# Patient Record
Sex: Male | Born: 1960
Health system: Southern US, Community
[De-identification: ages and names within clinical notes are randomized; demographics above are authoritative.]

## PROBLEM LIST (undated history)

## (undated) ENCOUNTER — Ambulatory Visit (HOSPITAL_COMMUNITY): Admission: EM | Discharge status: Absent without leave | Payer: Self-pay

## (undated) DIAGNOSIS — K219 Gastro-esophageal reflux disease without esophagitis: Secondary | ICD-10-CM

## (undated) DIAGNOSIS — E119 Type 2 diabetes mellitus without complications: Principal | ICD-10-CM

## (undated) DIAGNOSIS — J309 Allergic rhinitis, unspecified: Secondary | ICD-10-CM

## (undated) DIAGNOSIS — E785 Hyperlipidemia, unspecified: Secondary | ICD-10-CM

## (undated) DIAGNOSIS — F17200 Nicotine dependence, unspecified, uncomplicated: Secondary | ICD-10-CM

## (undated) DIAGNOSIS — G4710 Hypersomnia, unspecified: Secondary | ICD-10-CM

## (undated) DIAGNOSIS — J45909 Unspecified asthma, uncomplicated: Secondary | ICD-10-CM

## (undated) HISTORY — DX: Type 2 diabetes mellitus without complications: E11.9

## (undated) HISTORY — DX: Hyperlipidemia, unspecified: E78.5

## (undated) HISTORY — DX: Hypersomnia, unspecified: G47.10

## (undated) HISTORY — DX: Gastro-esophageal reflux disease without esophagitis: K21.9

## (undated) HISTORY — DX: Unspecified asthma, uncomplicated: J45.909

## (undated) HISTORY — DX: Allergic rhinitis, unspecified: J30.9

## (undated) HISTORY — DX: Nicotine dependence, unspecified, uncomplicated: F17.200

## (undated) HISTORY — PX: OTHER SURGICAL HISTORY: SHX169

## (undated) HISTORY — PX: TONSILLECTOMY: SUR1361

---

## 2007-05-29 ENCOUNTER — Ambulatory Visit: Payer: Self-pay | Admitting: Internal Medicine

## 2007-05-29 LAB — CONVERTED CEMR LAB
BUN: 19 mg/dL (ref 6–23)
Basophils Absolute: 0.1 10*3/uL (ref 0.0–0.1)
Bilirubin Urine: NEGATIVE
CO2: 32 meq/L (ref 19–32)
Cholesterol: 216 mg/dL (ref 0–200)
Creatinine,U: 134.3 mg/dL
Direct LDL: 137.6 mg/dL
GFR calc Af Amer: 103 mL/min
HDL: 25.3 mg/dL — ABNORMAL LOW (ref >39.0)
Hemoglobin, Urine: NEGATIVE
Hemoglobin: 15.9 g/dL (ref 13.0–17.0)
Leukocytes, UA: NEGATIVE
Lymphocytes Relative: 34 % (ref 12.0–46.0)
MCHC: 35 g/dL (ref 30.0–36.0)
Microalb, Ur: 0.4 mg/dL (ref 0.0–1.9)
Monocytes Absolute: 0.3 10*3/uL (ref 0.2–0.7)
Monocytes Relative: 4.8 % (ref 3.0–11.0)
Neutro Abs: 3.9 10*3/uL (ref 1.4–7.7)
Potassium: 4.2 meq/L (ref 3.5–5.1)
VLDL: 51 mg/dL — ABNORMAL HIGH (ref 0–40)

## 2008-04-28 ENCOUNTER — Ambulatory Visit: Payer: Self-pay | Admitting: Internal Medicine

## 2008-04-28 DIAGNOSIS — E119 Type 2 diabetes mellitus without complications: Secondary | ICD-10-CM

## 2008-04-28 HISTORY — DX: Type 2 diabetes mellitus without complications: E11.9

## 2008-04-28 LAB — CONVERTED CEMR LAB
Alkaline Phosphatase: 52 units/L (ref 39–117)
Basophils Absolute: 0 10*3/uL (ref 0.0–0.1)
Bilirubin Urine: NEGATIVE
Bilirubin, Direct: 0.1 mg/dL (ref 0.0–0.3)
Eosinophils Absolute: 0.1 10*3/uL (ref 0.0–0.7)
GFR calc Af Amer: 103 mL/min
GFR calc non Af Amer: 85 mL/min
HCT: 48.2 % (ref 39.0–52.0)
HDL: 29.1 mg/dL — ABNORMAL LOW (ref >39.0)
Hemoglobin, Urine: NEGATIVE
Hgb A1c MFr Bld: 7.4 % — ABNORMAL HIGH (ref 4.6–6.0)
MCHC: 34.8 g/dL (ref 30.0–36.0)
MCV: 90.1 fL (ref 78.0–100.0)
Microalb Creat Ratio: 2.4 mg/g (ref 0.0–30.0)
Microalb, Ur: 0.7 mg/dL (ref 0.0–1.9)
Monocytes Absolute: 0.4 10*3/uL (ref 0.1–1.0)
Monocytes Relative: 5.6 % (ref 3.0–12.0)
Nitrite: NEGATIVE
PSA: 0.35 ng/mL (ref 0.10–4.00)
Platelets: 121 10*3/uL — ABNORMAL LOW (ref 150–400)
Potassium: 4.3 meq/L (ref 3.5–5.1)
RDW: 11.9 % (ref 11.5–14.6)
Sodium: 137 meq/L (ref 135–145)
TSH: 1.83 microintl units/mL (ref 0.35–5.50)
Total Bilirubin: 0.8 mg/dL (ref 0.3–1.2)
Triglycerides: 338 mg/dL (ref 0–149)

## 2008-04-30 ENCOUNTER — Telehealth: Payer: Self-pay | Admitting: Internal Medicine

## 2008-05-05 DIAGNOSIS — K219 Gastro-esophageal reflux disease without esophagitis: Secondary | ICD-10-CM | POA: Insufficient documentation

## 2008-05-05 DIAGNOSIS — J45909 Unspecified asthma, uncomplicated: Secondary | ICD-10-CM | POA: Insufficient documentation

## 2008-05-05 DIAGNOSIS — J309 Allergic rhinitis, unspecified: Secondary | ICD-10-CM

## 2008-05-05 DIAGNOSIS — E785 Hyperlipidemia, unspecified: Secondary | ICD-10-CM | POA: Insufficient documentation

## 2008-05-05 HISTORY — DX: Gastro-esophageal reflux disease without esophagitis: K21.9

## 2008-05-05 HISTORY — DX: Unspecified asthma, uncomplicated: J45.909

## 2008-05-05 HISTORY — DX: Hyperlipidemia, unspecified: E78.5

## 2008-05-05 HISTORY — DX: Allergic rhinitis, unspecified: J30.9

## 2009-03-10 ENCOUNTER — Ambulatory Visit: Payer: Self-pay | Admitting: Internal Medicine

## 2009-03-10 DIAGNOSIS — G4710 Hypersomnia, unspecified: Secondary | ICD-10-CM

## 2009-03-10 DIAGNOSIS — F17200 Nicotine dependence, unspecified, uncomplicated: Secondary | ICD-10-CM | POA: Insufficient documentation

## 2009-03-10 HISTORY — DX: Hypersomnia, unspecified: G47.10

## 2009-03-10 HISTORY — DX: Nicotine dependence, unspecified, uncomplicated: F17.200

## 2009-03-14 LAB — CONVERTED CEMR LAB
Alkaline Phosphatase: 58 units/L (ref 39–117)
BUN: 22 mg/dL (ref 6–23)
Basophils Relative: 0.4 % (ref 0.0–3.0)
Bilirubin Urine: NEGATIVE
Bilirubin, Direct: 0.1 mg/dL (ref 0.0–0.3)
Calcium: 9.3 mg/dL (ref 8.4–10.5)
Creatinine, Ser: 1 mg/dL (ref 0.4–1.5)
Creatinine,U: 143.7 mg/dL
Direct LDL: 98.5 mg/dL
Eosinophils Absolute: 0.1 10*3/uL (ref 0.0–0.7)
HDL: 26.8 mg/dL — ABNORMAL LOW (ref >39.00)
Hemoglobin, Urine: NEGATIVE
Ketones, ur: NEGATIVE mg/dL
Leukocytes, UA: NEGATIVE
Lymphocytes Relative: 30 % (ref 12.0–46.0)
MCHC: 34.5 g/dL (ref 30.0–36.0)
Monocytes Absolute: 0.3 10*3/uL (ref 0.1–1.0)
Neutrophils Relative %: 62 % (ref 43.0–77.0)
PSA: 0.27 ng/mL (ref 0.10–4.00)
Platelets: 114 10*3/uL — ABNORMAL LOW (ref 150.0–400.0)
RBC: 5.33 M/uL (ref 4.22–5.81)
Specific Gravity, Urine: 1.025 (ref 1.000–1.030)
TSH: 1.96 microintl units/mL (ref 0.35–5.50)
Total Bilirubin: 0.7 mg/dL (ref 0.3–1.2)
Total CHOL/HDL Ratio: 7
Total Protein: 6.7 g/dL (ref 6.0–8.3)
Triglycerides: 406 mg/dL — ABNORMAL HIGH (ref 0.0–149.0)
Urine Glucose: NEGATIVE mg/dL
Urobilinogen, UA: 0.2 (ref 0.0–1.0)
VLDL: 81.2 mg/dL — ABNORMAL HIGH (ref 0.0–40.0)
WBC: 6.4 10*3/uL (ref 4.5–10.5)

## 2009-09-08 ENCOUNTER — Ambulatory Visit: Payer: Self-pay | Admitting: Internal Medicine

## 2009-09-08 DIAGNOSIS — M25569 Pain in unspecified knee: Secondary | ICD-10-CM | POA: Insufficient documentation

## 2009-09-09 LAB — CONVERTED CEMR LAB
CO2: 28 meq/L (ref 19–32)
Calcium: 9.5 mg/dL (ref 8.4–10.5)
Chloride: 103 meq/L (ref 96–112)
Cholesterol: 179 mg/dL (ref 0–200)
Direct LDL: 97.6 mg/dL
Glucose, Bld: 251 mg/dL — ABNORMAL HIGH (ref 70–99)
HDL: 34.1 mg/dL — ABNORMAL LOW (ref >39.00)
Potassium: 4.5 meq/L (ref 3.5–5.1)
Sodium: 139 meq/L (ref 135–145)
Total CHOL/HDL Ratio: 5
Triglycerides: 359 mg/dL — ABNORMAL HIGH (ref 0.0–149.0)

## 2009-09-20 ENCOUNTER — Ambulatory Visit: Payer: Self-pay | Admitting: Internal Medicine

## 2009-09-20 DIAGNOSIS — J40 Bronchitis, not specified as acute or chronic: Secondary | ICD-10-CM | POA: Insufficient documentation

## 2010-01-18 ENCOUNTER — Ambulatory Visit: Payer: Self-pay | Admitting: Internal Medicine

## 2010-01-18 DIAGNOSIS — R062 Wheezing: Secondary | ICD-10-CM | POA: Insufficient documentation

## 2010-01-18 DIAGNOSIS — J019 Acute sinusitis, unspecified: Secondary | ICD-10-CM | POA: Insufficient documentation

## 2010-01-21 ENCOUNTER — Ambulatory Visit: Payer: Self-pay | Admitting: Family Medicine

## 2010-01-25 ENCOUNTER — Ambulatory Visit: Payer: Self-pay | Admitting: Internal Medicine

## 2010-02-06 ENCOUNTER — Encounter: Payer: Self-pay | Admitting: Internal Medicine

## 2010-02-08 ENCOUNTER — Telehealth: Payer: Self-pay | Admitting: Internal Medicine

## 2010-02-28 ENCOUNTER — Telehealth: Payer: Self-pay | Admitting: Internal Medicine

## 2010-03-06 ENCOUNTER — Ambulatory Visit: Payer: Self-pay | Admitting: Internal Medicine

## 2010-03-06 ENCOUNTER — Telehealth: Payer: Self-pay | Admitting: Internal Medicine

## 2010-03-06 LAB — CONVERTED CEMR LAB
Alkaline Phosphatase: 47 units/L (ref 39–117)
Basophils Absolute: 0 10*3/uL (ref 0.0–0.1)
Bilirubin Urine: NEGATIVE
Bilirubin, Direct: 0.1 mg/dL (ref 0.0–0.3)
Calcium: 9.4 mg/dL (ref 8.4–10.5)
Cholesterol: 139 mg/dL (ref 0–200)
Creatinine, Ser: 1 mg/dL (ref 0.4–1.5)
Creatinine,U: 145.1 mg/dL
Direct LDL: 62.2 mg/dL
Eosinophils Absolute: 0.1 10*3/uL (ref 0.0–0.7)
HDL: 30.3 mg/dL — ABNORMAL LOW (ref >39.00)
Hemoglobin, Urine: NEGATIVE
Hemoglobin: 15.7 g/dL (ref 13.0–17.0)
Ketones, ur: NEGATIVE mg/dL
Lymphocytes Relative: 27.2 % (ref 12.0–46.0)
Lymphs Abs: 2 10*3/uL (ref 0.7–4.0)
MCHC: 35.5 g/dL (ref 30.0–36.0)
Neutro Abs: 5 10*3/uL (ref 1.4–7.7)
Platelets: 116 10*3/uL — ABNORMAL LOW (ref 150.0–400.0)
RDW: 12.7 % (ref 11.5–14.6)
Sodium: 140 meq/L (ref 135–145)
Total Bilirubin: 0.5 mg/dL (ref 0.3–1.2)
Total CHOL/HDL Ratio: 5
Triglycerides: 331 mg/dL — ABNORMAL HIGH (ref 0.0–149.0)
Urine Glucose: >=1000 mg/dL
Urobilinogen, UA: 0.2 (ref 0.0–1.0)
VLDL: 66.2 mg/dL — ABNORMAL HIGH (ref 0.0–40.0)

## 2010-03-10 ENCOUNTER — Ambulatory Visit: Payer: Self-pay | Admitting: Internal Medicine

## 2010-03-31 ENCOUNTER — Encounter (INDEPENDENT_AMBULATORY_CARE_PROVIDER_SITE_OTHER): Payer: Self-pay | Admitting: *Deleted

## 2010-04-26 ENCOUNTER — Telehealth: Payer: Self-pay | Admitting: Internal Medicine

## 2010-04-28 ENCOUNTER — Encounter (INDEPENDENT_AMBULATORY_CARE_PROVIDER_SITE_OTHER): Payer: Self-pay | Admitting: *Deleted

## 2010-05-01 ENCOUNTER — Ambulatory Visit: Payer: Self-pay | Admitting: Gastroenterology

## 2010-05-12 ENCOUNTER — Ambulatory Visit: Payer: Self-pay | Admitting: Gastroenterology

## 2010-06-12 ENCOUNTER — Ambulatory Visit: Payer: Self-pay | Admitting: Internal Medicine

## 2010-06-13 LAB — CONVERTED CEMR LAB
CO2: 28 meq/L (ref 19–32)
Calcium: 9.1 mg/dL (ref 8.4–10.5)
Chloride: 103 meq/L (ref 96–112)
HDL: 34.4 mg/dL — ABNORMAL LOW (ref >39.00)
Hgb A1c MFr Bld: 8.3 % — ABNORMAL HIGH (ref 4.6–6.5)
Potassium: 4.6 meq/L (ref 3.5–5.1)
Sodium: 139 meq/L (ref 135–145)
Triglycerides: 616 mg/dL — ABNORMAL HIGH (ref 0.0–149.0)
VLDL: 123.2 mg/dL — ABNORMAL HIGH (ref 0.0–40.0)

## 2010-06-16 ENCOUNTER — Ambulatory Visit: Payer: Self-pay | Admitting: Internal Medicine

## 2010-06-16 DIAGNOSIS — R05 Cough: Secondary | ICD-10-CM

## 2010-06-16 DIAGNOSIS — R059 Cough, unspecified: Secondary | ICD-10-CM | POA: Insufficient documentation

## 2010-06-22 ENCOUNTER — Ambulatory Visit: Payer: Self-pay | Admitting: Internal Medicine

## 2010-06-22 DIAGNOSIS — M549 Dorsalgia, unspecified: Secondary | ICD-10-CM | POA: Insufficient documentation

## 2010-06-30 ENCOUNTER — Telehealth: Payer: Self-pay | Admitting: Internal Medicine

## 2010-09-05 ENCOUNTER — Telehealth: Payer: Self-pay | Admitting: Endocrinology

## 2010-09-12 ENCOUNTER — Ambulatory Visit: Payer: Self-pay | Admitting: Internal Medicine

## 2010-09-12 LAB — CONVERTED CEMR LAB
BUN: 22 mg/dL (ref 6–23)
Chloride: 103 meq/L (ref 96–112)
Cholesterol: 157 mg/dL (ref 0–200)
Creatinine, Ser: 1.1 mg/dL (ref 0.4–1.5)
Hgb A1c MFr Bld: 8 % — ABNORMAL HIGH (ref 4.6–6.5)
Total CHOL/HDL Ratio: 5

## 2010-09-15 ENCOUNTER — Ambulatory Visit: Payer: Self-pay | Admitting: Internal Medicine

## 2010-10-23 ENCOUNTER — Telehealth (INDEPENDENT_AMBULATORY_CARE_PROVIDER_SITE_OTHER): Payer: Self-pay | Admitting: *Deleted

## 2010-12-06 NOTE — Progress Notes (Signed)
Summary: Rx refill req  Phone Note Call from Patient Call back at Home Phone [phone]   Caller: Spouse Summary of Call: Pt's sppuse called requesting refills of pt's test strips [facility] [place] Initial call taken by: Margaret Pyle, CMA,  April 26, 2010 1:31 PM    Prescriptions: [place] TEST   STRP (GLUCOSE BLOOD) test atleast  three times a day  #100 x 11   Entered by:   Margaret Pyle, CMA   Authorized by:   Corwin Levins MD   Signed by:   Margaret Pyle, CMA on 04/26/2010   Method used:   [facility]ronically to        [facility]  [address] [phone][facility] (retail)       [address]       [place], [place]  [address]       Ph: [phone] or [phone]       Fax: [phone]   RxID:   [id number]

## 2010-12-06 NOTE — Procedures (Signed)
Summary: Colonoscopy  Patient: Wayne Diaz Note: All result statuses are Final unless otherwise noted.  Tests: (1) Colonoscopy (COL)   COL Colonoscopy           DONE     [facility]     [address]     [place], [place]  [address]           COLONOSCOPY PROCEDURE REPORT           PATIENT:  Wayne, Diaz  MR#:  [id number]     BIRTHDATE:  08-Jan-1961, 49 yrs. old  GENDER:  male     ENDOSCOPIST:  Rachael Fee, MD     REF. BY:  Oliver Barre, M.D.     PROCEDURE DATE:  05/12/2010     PROCEDURE:  Diagnostic Colonoscopy     ASA CLASS:  Class II     INDICATIONS:  Routine Risk Screening     MEDICATIONS:   Fentanyl 75 mcg IV, Versed 7 mg IV           DESCRIPTION OF PROCEDURE:   After the risks benefits and     alternatives of the procedure were thoroughly explained, informed     consent was obtained.  Digital rectal exam was performed and     revealed no rectal masses.   The Pentax Colonoscope [id number]     endoscope was introduced through the anus and advanced to the     cecum, which was identified by both the appendix and ileocecal     valve, without limitations.  The quality of the prep was good,     using MoviPrep.  The instrument was then slowly withdrawn as the     colon was fully examined.     <<PROCEDUREIMAGES>>           FINDINGS:  A normal appearing cecum, ileocecal valve, and     appendiceal orifice were identified. The ascending, hepatic     flexure, transverse, splenic flexure, descending, sigmoid colon,     and rectum appeared unremarkable (see image1, image2, and image3).     Retroflexed views in the rectum revealed no abnormalities.    The     scope was then withdrawn from the patient and the procedure     completed.           COMPLICATIONS:  None           ENDOSCOPIC IMPRESSION:     1) Normal colon     2) No polyps or cancers           RECOMMENDATIONS:     1) Continue current colorectal screening recommendations for     "routine risk" patients with a  repeat colonoscopy in 10 years.           REPEAT EXAM:  10 years           ______________________________     Rachael Fee, MD           n.     eSIGNED:   Rachael Fee at 05/12/2010 11:34 AM           Macarthur Critchley, [id number]  Note: An exclamation mark (!) indicates a result that was not dispersed into the flowsheet. Document Creation Date: 05/12/2010 11:36 AM _______________________________________________________________________  (1) Order result status: Final Collection or observation date-time: 05/12/2010 11:31 Requested date-time:  Receipt date-time:  Reported date-time:  Referring Physician:   Ordering Physician: Rob Bunting [phone]) Specimen Source:  Source: [facility]  Filler Order Number: [id number] Lab site:   Appended Document: Colonoscopy    Clinical Lists Changes  Observations: Added new observation of COLONNXTDUE: 05/2020 (05/12/2010 11:46)

## 2010-12-06 NOTE — Assessment & Plan Note (Signed)
Summary: cough-lb   Vital Signs:  Patient profile:   50 year old male Height:      69.5 inches Weight:      214.50 pounds BMI:     31.33 O2 Sat:      94 % on Room air Temp:     98.1 degrees F oral BP sitting:   108 / 70  (left arm) Cuff size:   large  Vitals Entered By: Zella Ball Ewing CMA Duncan Dull) (June 22, 2010 2:05 PM)  O2 Flow:  Room air CC: Cough for 1 week/RE   Primary Care Provider:  Corwin Levins MD  CC:  Cough for 1 week/RE.  History of Present Illness: here with acute onset 3 days fever, ST and prod cough greenish sputum but Pt denies CP, worsening sob, doe, orthopnea, pnd, worsening LE edema, palps, dizziness or syncope , but has onset midl to mod wheezing. Pt denies new neuro symptoms such as headache, facial or extremity weakness  Pt denies polydipsia, polyuria, or low sugar symptoms such as shakiness improved with eating.  Overall good compliance with meds, trying to follow low chol, DM diet, wt stable, little excercise however  CBGs in the lower 100's.  Mid back pain is stable , chronic without bowel or bladder change, worsening LE pain, numbness, weakness.    Problems Prior to Update: 1)  Back Pain  (ICD-724.5) 2)  Bronchitis-acute  (ICD-466.0) 3)  Cough  (ICD-786.2) 4)  Wheezing  (ICD-786.07) 5)  Sinusitis- Acute-nos  (ICD-461.9) 6)  Bronchitis  (ICD-490) 7)  Knee Pain, Right  (ICD-719.46) 8)  Smoker  (ICD-305.1) 9)  Hypersomnia  (ICD-780.54) 10)  Asthma  (ICD-493.90) 11)  Gerd  (ICD-530.81) 12)  Allergic Rhinitis  (ICD-477.9) 13)  Hyperlipidemia  (ICD-272.4) 14)  Preventive Health Care  (ICD-V70.0) 15)  Diabetes Mellitus, Type II  (ICD-250.00)  Medications Prior to Update: 1)  Janumet 50-1000 Mg Tabs (Sitagliptin-Metformin Hcl) .Marland Kitchen.. 1po Two Times A Day 2)  Pravastatin Sodium 40 Mg  Tabs (Pravastatin Sodium) .... Take 2 Tablet By Mouth Once A Day 3)  Cialis 20 Mg  Tabs (Tadalafil) .Marland Kitchen.. 1 By Mouth Once Daily As Needed 4)  Adult Aspirin Ec Low Strength 81 Mg   Tbec (Aspirin) .Marland Kitchen.. 1 By Mouth Once Daily 5)  Onetouch Ultra Test   Strp (Glucose Blood) .... Test At Least  Three Times A Day 6)  Proair Hfa 108 (90 Base) Mcg/act Aers (Albuterol Sulfate) .... 2 Puffs Every 4 Hours As Needed For Sortness of Breath 7)  Lantus Solostar 100 Unit/ml Soln (Insulin Glargine) .... Use Asd 10 Units Per Day 8)  Lancets  Misc (Lancets) .... Use Asd Three Times A Day  Current Medications (verified): 1)  Janumet 50-1000 Mg Tabs (Sitagliptin-Metformin Hcl) .Marland Kitchen.. 1po Two Times A Day 2)  Pravastatin Sodium 40 Mg  Tabs (Pravastatin Sodium) .... Take 2 Tablet By Mouth Once A Day 3)  Cialis 20 Mg  Tabs (Tadalafil) .Marland Kitchen.. 1 By Mouth Once Daily As Needed 4)  Adult Aspirin Ec Low Strength 81 Mg  Tbec (Aspirin) .Marland Kitchen.. 1 By Mouth Once Daily 5)  Onetouch Ultra Test   Strp (Glucose Blood) .... Test At Least  Three Times A Day 6)  Proair Hfa 108 (90 Base) Mcg/act Aers (Albuterol Sulfate) .... 2 Puffs Every 4 Hours As Needed For Sortness of Breath 7)  Lantus Solostar 100 Unit/ml Soln (Insulin Glargine) .... Use Asd 10 Units Per Day 8)  Lancets  Misc (Lancets) .... Use Asd Three  Times A Day 9)  Medrol (Pak) 4 Mg Tabs (Methylprednisolone) .... Use Asd  - 1 Pack 10)  Azithromycin 250 Mg Tabs (Azithromycin) .... 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  , mild ill  Head:  normocephalic and atraumatic.    Eyes:  vision grossly intact, pupils equal, and pupils round.   Ears:  bilat tm's red, sinus nontender Nose:  nasal dischargemucosal pallor and mucosal edema.   Mouth:  pharyngeal erythema and fair dentition.   Neck:  supple and no masses.   Lungs:  normal respiratory effort, R decreased breath sounds, R wheezes, L decreased breath sounds, and L wheezes.   Heart:  normal rate and regular rhythm.   Abdomen:  soft, non-tender, and normal bowel sounds.   Msk:  no back tender, no spine tender Extremities:  no edema, no erythema    Impression & Recommendations:  Problem # 1:  BRONCHITIS-ACUTE (ICD-466.0)  His updated medication list for this problem includes:    Proair Hfa 108 (90 Base) Mcg/act Aers (Albuterol sulfate) .Marland Kitchen... 2 puffs every 4 hours as needed for sortness of breath    Azithromycin 250 Mg Tabs (Azithromycin) .Marland Kitchen... 2po qd for 1 day, then 1po qd for 4days, then stop  Orders: Depo- Medrol 40mg  (J1030) Depo- Medrol 80mg  (J1040) Admin of Therapeutic Inj  intramuscular or subcutaneous (87564) treat as above, f/u any worsening signs or symptoms   Problem # 2:  WHEEZING (ICD-786.07) mild, for depo shot today, and pred pack asd   Problem # 3:  DIABETES MELLITUS, TYPE II (ICD-250.00)  His updated medication list for this problem includes:    Janumet 50-1000 Mg Tabs (Sitagliptin-metformin hcl) .Marland Kitchen... 1po two times a day    Adult Aspirin Ec Low Strength 81 Mg Tbec (Aspirin) .Marland Kitchen... 1 by mouth once daily    Lantus Solostar 100 Unit/ml Soln (Insulin glargine) ..... Use asd 10 units per day  Labs Reviewed: Creat: 0.9 (06/12/2010)    Reviewed HgBA1c results: 8.3 (06/12/2010)  8.6 (03/06/2010) recent uncontrolled prior to lantus; now better controlled by cbg's;  stable overall by hx and exam, ok to continue meds/tx as is , f/u next visit  Problem # 4:  BACK PAIN (ICD-724.5)  His updated medication list for this problem includes:    Adult Aspirin Ec Low Strength 81 Mg Tbec  (Aspirin) .Marland Kitchen... 1 by mouth once daily stable overall by hx and exam, ok to continue meds/tx as is   Complete Medication List: 1)  Janumet 50-1000 Mg Tabs (Sitagliptin-metformin hcl) .Marland Kitchen.. 1po two times a day 2)  Pravastatin Sodium 40 Mg Tabs (Pravastatin sodium) .... Take 2 tablet by mouth once a day 3)  Cialis 20 Mg Tabs (Tadalafil) .Marland Kitchen.. 1 by mouth once daily as needed 4)  Adult Aspirin Ec Low Strength 81 Mg Tbec (Aspirin) .Marland Kitchen.. 1 by mouth once daily 5)  Onetouch Ultra Test Strp (Glucose blood) .... Test at least  three times  a day 6)  Proair Hfa 108 (90 Base) Mcg/act Aers (Albuterol sulfate) .... 2 puffs every 4 hours as needed for sortness of breath 7)  Lantus Solostar 100 Unit/ml Soln (Insulin glargine) .... Use asd 10 units per day 8)  Lancets Misc (Lancets) .... Use asd three times a day 9)  Medrol (pak) 4 Mg Tabs (Methylprednisolone) .... Use asd  - 1 pack 10)  Azithromycin 250 Mg Tabs (Azithromycin) .... 2po qd for 1 day, then 1po qd for 4days, then stop  Patient Instructions: 1)  you had the steroid shot today 2)  Please take all new medications as prescribed 3)  Continue all previous medications as before this visit  4)  Please schedule a follow-up appointment in 3 months. 5)  BMP prior to visit, ICD-9: 250.02 6)  Lipid Panel prior to visit, ICD-9: 7)  HbgA1C prior to visit, ICD-9: Prescriptions: AZITHROMYCIN 250 MG TABS (AZITHROMYCIN) 2po qd for 1 day, then 1po qd for 4days, then stop  #6 x 1   Entered and Authorized by:   Corwin Levins MD   Signed by:   Corwin Levins MD on 06/22/2010   Method used:   Print then Give to Patient   RxID:   [id number] MEDROL (PAK) 4 MG TABS (METHYLPREDNISOLONE) use asd  - 1 pack  #1 x 0   Entered and Authorized by:   Corwin Levins MD   Signed by:   Corwin Levins MD on 06/22/2010   Method used:   Print then Give to Patient   RxID:   [id number]    Medication Administration  Injection # 1:    Medication: Depo- Medrol 40mg      Diagnosis: BRONCHITIS-ACUTE (ICD-466.0)    Route: IM    Site: LUOQ gluteus    Exp Date: 02/2013    Lot #: [id number]    Mfr: [employer]    Comments: Patient received 120mg  Depo-medrol    Patient tolerated injection without complications    Given by: Zella Ball Ewing CMA Duncan Dull) (June 22, 2010 3:21 PM)  Injection # 2:    Medication: Depo- Medrol 80mg     Diagnosis: BRONCHITIS-ACUTE (ICD-466.0)    Route: IM    Site: LUOQ gluteus    Exp Date: 02/2013    Lot #: [id number]    Mfr: [employer]    Given by: Zella Ball Ewing CMA Duncan Dull) (June 22, 2010 3:21 PM)  Orders Added: 1)  Depo- Medrol 40mg  [J1030] 2)  Depo- Medrol 80mg  [J1040] 3)  Admin of Therapeutic Inj  intramuscular or subcutaneous [96372] 4)  Est. Patient Level IV [41324]

## 2010-12-06 NOTE — Assessment & Plan Note (Signed)
Summary: COLD/SINUS INF--CONGESTION, MUCOUS PRODUCTION, AND BODY ACHES...   Vital Signs:  Patient profile:   50 year old male Height:      67 inches Weight:      233.25 pounds BMI:     36.66 O2 Sat:      97 % on Room air Temp:     98.1 degrees F oral Pulse rate:   91 / minute BP sitting:   110 / 80  (left arm) Cuff size:   large  Vitals Entered ByZella Ball Ewing (January 18, 2010 4:35 PM)  O2 Flow:  Room air CC: congestion,cough, sinus headache/RE   Primary Care Provider:  Corwin Levins MD  CC:  congestion, cough, and sinus headache/RE.  History of Present Illness: here with 3 days onset facial pain, pressure, fever, ST and mild prod cough greenish sputum, with headache, myagias and genral weakness, fatigue and malaise.  Pt denies CP, orthopnea, pnd, worsening LE edema, palps, dizziness or syncope , but has developed mild wheezing, sob . Pt denies new neuro symptoms such as headache, facial or extremity weakness   Pt denies polydipsia, polyuria, or low sugar symptoms such as shakiness improved with eating.  Overall good compliance with meds, trying to follow low chol, DM diet, wt stable, little excercise however   Problems Prior to Update: 1)  Wheezing  (ICD-786.07) 2)  Sinusitis- Acute-nos  (ICD-461.9) 3)  Bronchitis  (ICD-490) 4)  Knee Pain, Right  (ICD-719.46) 5)  Smoker  (ICD-305.1) 6)  Hypersomnia  (ICD-780.54) 7)  Asthma  (ICD-493.90) 8)  Gerd  (ICD-530.81) 9)  Allergic Rhinitis  (ICD-477.9) 10)  Hyperlipidemia  (ICD-272.4) 11)  Preventive Health Care  (ICD-V70.0) 12)  Diabetes Mellitus, Type II  (ICD-250.00)  Medications Prior to Update: 1)  Glimepiride 4 Mg  Tabs (Glimepiride) .Marland Kitchen.. 1 By Mouth Qam and 1/2 By Mouth in The Pm 2)  Pravastatin Sodium 40 Mg  Tabs (Pravastatin Sodium) .... Take 2 Tablet By Mouth Once A Day 3)  Cialis 20 Mg  Tabs (Tadalafil) .Marland Kitchen.. 1 By Mouth Once Daily As Needed 4)  Adult Aspirin Ec Low Strength 81 Mg  Tbec (Aspirin) .Marland Kitchen.. 1 By Mouth Once  Daily 5)  Onetouch Ultra Test   Strp (Glucose Blood) .... Test Atleast  Three Times A Day 6)  Tessalon Perles 100 Mg Caps (Benzonatate) .Marland Kitchen.. 1 By Mouth Three Times A Day As Needed For Cough 7)  Proair Hfa 108 (90 Base) Mcg/act Aers (Albuterol Sulfate) .... 2 Puffs Every 4 Hours As Needed For Sortness of Breath  Current Medications (verified): 1)  Glimepiride 4 Mg  Tabs (Glimepiride) .Marland Kitchen.. 1 By Mouth Qam and 1/2 By Mouth in The Pm 2)  Pravastatin Sodium 40 Mg  Tabs (Pravastatin Sodium) .... Take 2 Tablet By Mouth Once A Day 3)  Cialis 20 Mg  Tabs (Tadalafil) .Marland Kitchen.. 1 By Mouth Once Daily As Needed 4)  Adult Aspirin Ec Low Strength 81 Mg  Tbec (Aspirin) .Marland Kitchen.. 1 By Mouth Once Daily 5)  Onetouch Ultra Test   Strp (Glucose Blood) .... Test Atleast  Three Times A Day 6)  Hydrocodone-Homatropine 5-1.5 Mg/27ml Syrp (Hydrocodone-Homatropine) .Marland Kitchen.. 1 Tsp By Mouth Q 6 Hrs As Needed Cough 7)  Proair Hfa 108 (90 Base) Mcg/act Aers (Albuterol Sulfate) .... 2 Puffs Every 4 Hours As Needed For Sortness of Breath 8)  Azithromycin 250 Mg Tabs (Azithromycin) .... 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   Eyes:  vision grossly intact, pupils equal, and pupils  round.   Ears:  bilat tm's red, sinus tender bilat Nose:  nasal dischargemucosal pallor and mucosal edema.   Mouth:  pharyngeal erythema and fair dentition.   Neck:  supple and cervical lymphadenopathy.   Lungs:  normal respiratory effort, R decreased breath sounds, R wheezes, L decreased breath sounds, and L wheezes.   Heart:  normal rate and regular rhythm.   Extremities:  no edema, no erythema    Impression & Recommendations:  Problem # 1:  SINUSITIS- ACUTE-NOS (ICD-461.9)  His updated medication list for this problem includes:    Hydrocodone-homatropine 5-1.5 Mg/74ml Syrp (Hydrocodone-homatropine) .Marland Kitchen... 1 tsp by mouth q 6 hrs as needed cough    Azithromycin 250 Mg Tabs (Azithromycin) .Marland Kitchen... 2po qd for 1 day, then 1po qd for 4days, then stop treat as above, f/u any worsening signs or symptoms   Problem # 2:  WHEEZING (ICD-786.07) mild, likely due to above, tx with prednisone burst and taper off  Problem # 3:  DIABETES MELLITUS, TYPE II (ICD-250.00)  His updated medication list for this problem includes:    Glimepiride 4 Mg Tabs (Glimepiride) .Marland Kitchen... 1 by mouth qam and 1/2 by mouth in the pm    Adult Aspirin Ec Low Strength 81 Mg Tbec (Aspirin) .Marland Kitchen... 1 by mouth once daily  Labs Reviewed: Creat: 1.0 (09/08/2009)    Reviewed HgBA1c results: 7.7 (09/08/2009)  6.7 (03/10/2009) stable overall by hx and exam, ok to continue meds/tx as is ;  pt to monitor for polys, call for cbg > 200  Complete Medication List: 1)  Glimepiride 4 Mg Tabs (Glimepiride) .Marland Kitchen.. 1 by mouth qam and 1/2 by mouth in the pm 2)  Pravastatin Sodium 40 Mg Tabs (Pravastatin sodium) .... Take 2 tablet by mouth once a day 3)  Cialis 20 Mg Tabs (Tadalafil) .Marland Kitchen.. 1 by mouth once daily as needed 4)  Adult Aspirin Ec Low Strength 81 Mg Tbec (Aspirin) .Marland Kitchen.. 1 by mouth once daily 5)  Onetouch Ultra Test Strp (Glucose blood) .... Test atleast  three times a day 6)  Hydrocodone-homatropine 5-1.5 Mg/88ml Syrp  (Hydrocodone-homatropine) .Marland Kitchen.. 1 tsp by mouth q 6 hrs as needed cough 7)  Proair Hfa 108 (90 Base) Mcg/act Aers (Albuterol sulfate) .... 2 puffs every 4 hours as needed for sortness of breath 8)  Azithromycin 250 Mg Tabs (Azithromycin) .... 2po qd for 1 day, then 1po qd for 4days, then stop 9)  Prednisone 10 Mg Tabs (Prednisone) .... 3po qd for 3days, then 2po qd for 3days, then 1po qd for 3days, then stop  Patient Instructions: 1)  Please take all new medications as prescribed 2)  Continue all previous medications as before this visit  3)  You can also use Mucinex OTC or it's generic  for congestion  4)  Please schedule a follow-up appointment in 2 months wtih CPX labs and : 5)  HbgA1C prior to visit, ICD-9: 250.02 6)  Urine Microalbumin prior to visit, ICD-9: Prescriptions: PREDNISONE 10 MG TABS (PREDNISONE) 3po qd for 3days, then 2po qd for 3days, then 1po qd for 3days, then stop  #18 x 0   Entered and Authorized by:   Corwin Levins MD   Signed by:   Corwin Levins MD on 01/18/2010   Method used:   Print then Give to Patient   RxID:   [id number] HYDROCODONE-HOMATROPINE 5-1.5 MG/5ML SYRP (HYDROCODONE-HOMATROPINE) 1 tsp by mouth q 6 hrs as needed cough  #6 oz x 1   Entered and Authorized by:   Corwin Levins MD   Signed by:   Corwin Levins MD on 01/18/2010   Method used:   Print then Give to Patient   RxID:   [id number] AZITHROMYCIN 250 MG TABS (AZITHROMYCIN) 2po qd for 1 day, then 1po qd for 4days, then stop  #6 x 1   Entered and Authorized by:   Corwin Levins MD   Signed by:   Corwin Levins MD on 01/18/2010   Method used:   Print then Give to Patient   RxID:   [id number]

## 2010-12-06 NOTE — Letter (Signed)
Summary: [facility] Instructions  [facility]  [address] [place], [place] [address]   Phone: [phone]  Fax: [phone]       Wayne Diaz    09/06/1961    MRN: [id number]        Procedure Day /Date: Friday 05/12/10     Arrival Time: 10:30 a.m.     Procedure Time: 11:30 a.m.     Location of Procedure:                    _x_  [facility] (4th Floor)                        PREPARATION FOR COLONOSCOPY WITH MOVIPREP   Starting 5 days prior to your procedure  05/07/10 Sunday do not eat nuts, seeds, popcorn, corn, beans, peas,  salads, or any raw vegetables.  Do not take any fiber supplements (e.g. Metamucil, Citrucel, and Benefiber).  THE DAY BEFORE YOUR PROCEDURE         DATE:  05/11/10  DAY:  Thursday  1.  Drink clear liquids the entire day-NO SOLID FOOD  2.  Do not drink anything colored red or purple.  Avoid juices with pulp.  No orange juice.  3.  Drink at least 64 oz. (8 glasses) of fluid/clear liquids during the day to prevent dehydration and help the prep work efficiently.  CLEAR LIQUIDS INCLUDE: Water Jello Ice Popsicles Tea (sugar ok, no milk/cream) Powdered fruit flavored drinks Coffee (sugar ok, no milk/cream) Gatorade Juice: apple, white grape, white cranberry  Lemonade Clear bullion, consomm, broth Carbonated beverages (any kind) Strained chicken noodle soup Hard Candy                             4.  In the morning, mix first dose of MoviPrep solution:    Empty 1 Pouch A and 1 Pouch B into the disposable container    Add lukewarm drinking water to the top line of the container. Mix to dissolve    Refrigerate (mixed solution should be used within 24 hrs)  5.  Begin drinking the prep at 5:00 p.m. The MoviPrep container is divided by 4 marks.   Every 15 minutes drink the solution down to the next mark (approximately 8 oz) until the full liter is complete.   6.  Follow completed prep with 16 oz of clear liquid of your choice  (Nothing red or purple).  Continue to drink clear liquids until bedtime.  7.  Before going to bed, mix second dose of MoviPrep solution:    Empty 1 Pouch A and 1 Pouch B into the disposable container    Add lukewarm drinking water to the top line of the container. Mix to dissolve    Refrigerate  THE DAY OF YOUR PROCEDURE      DATE:  05/12/10  DAY:  Friday  Beginning at  6:30 a.m. (5 hours before procedure):         1. Every 15 minutes, drink the solution down to the next mark (approx 8 oz) until the full liter is complete.  2. Follow completed prep with 16 oz. of clear liquid of your choice.    3. You may drink clear liquids until  9:30 a.m.  (2 HOURS BEFORE PROCEDURE).   MEDICATION INSTRUCTIONS  Unless otherwise instructed, you should take regular prescription medications with a small sip of water  as early as possible the morning of your procedure.  Diabetic patients - see separate instructions.           OTHER INSTRUCTIONS  You will need a responsible adult at least 51 years of age to accompany you and drive you home.   This person must remain in the waiting room during your procedure.  Wear loose fitting clothing that is easily removed.  Leave jewelry and other valuables at home.  However, you may wish to bring a book to read or  an iPod/MP3 player to listen to music as you wait for your procedure to start.  Remove all body piercing jewelry and leave at home.  Total time from sign-in until discharge is approximately 2-3 hours.  You should go home directly after your procedure and rest.  You can resume normal activities the  day after your procedure.  The day of your procedure you should not:   Drive   Make legal decisions   Operate machinery   Drink alcohol   Return to work  You will receive specific instructions about eating, activities and medications before you leave.    The above instructions have been reviewed and explained to me by   Ezra Sites RN  May 01, 2010 8:31 AM   I fully understand and can verbalize these instructions _____________________________ Date _________

## 2010-12-06 NOTE — Progress Notes (Signed)
[facility]   Imported By: Lester  02/13/2010 10:08:26  _____________________________________________________________________  External Attachment:    Type:   Image     Comment:   External Document

## 2010-12-06 NOTE — Assessment & Plan Note (Signed)
Summary: blood sugar is high   Vital Signs:  Patient profile:   50 year old male Weight:      229 pounds Temp:     98.3 degrees F oral BP sitting:   120 / 80  (left arm)  Vitals Entered By: Doristine Devoid (January 21, 2010 11:16 AM) CC: concern about elevated blood sugar was 469 this am  Is Patient Diabetic? 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 Known type II diabetic on glimepiride 4 mg b.i.d. Blood sugars have been ranging around low 200s but recently he was placed on prednisone for upper resp infection and now blood sugars 425 to 469 past day. poor diet compliance with increased starches last night for dinner. Urine frequency and thirst. Only taking glimepiride for his diabetes at this point. Drinking plenty of fluids. CBG 425 today and he took 2 4mg  Amaryl tablets.  Upper respir sxs have improved at this time.  No wheezing.  Allergies: No Known Drug Allergies  Past History:  Past Medical History: Last updated: 04/28/2008 Diabetes mellitus, type II Hyperlipidemia Allergic rhinitis GERD Asthma PMH reviewed for relevance  Review of Systems  The patient denies anorexia, fever, weight loss, prolonged cough, and headaches.    Physical Exam  General:  Well-developed,well-nourished,in no acute distress; alert,appropriate and cooperative throughout examination Head:  Normocephalic and atraumatic without obvious abnormalities. No apparent alopecia or balding. Mouth:  Oral mucosa and oropharynx without lesions or exudates.  Teeth in good repair. Lungs:  Normal respiratory effort, chest expands symmetrically. Lungs are clear to auscultation, no crackles or wheezes. Heart:  normal rate and regular rhythm.   Neurologic:  alert & oriented X3, cranial nerves II-XII intact, strength normal in all extremities, and gait normal.     Impression  & Recommendations:  Problem # 1:  DIABETES MELLITUS, TYPE II (ICD-250.00) Hyperglycemia exacerbated by recent prednisone.  CBG here about 3 hours pp 301 after taking double dose med this AM.  Discussed options with patient and he will d/c prednisone at this time.  Cont Glimepiride 4 mg two times a day and close f/u with Dr Jonny Ruiz.  He is encouraged to drink plenty of water and call me back if persistent CBGs >400.  We elected not to give any insulin since he had doubled up his oral med this AM with signif reduction in glucose. His updated medication list for this problem includes:    Glimepiride 4 Mg Tabs (Glimepiride) .Marland Kitchen... 1 by mouth qam and 1/2 by mouth in the pm    Adult Aspirin Ec Low Strength 81 Mg Tbec (Aspirin) .Marland Kitchen... 1 by mouth once daily  Complete Medication List: 1)  Glimepiride 4 Mg Tabs (Glimepiride) .Marland Kitchen.. 1 by mouth qam and 1/2 by mouth in the pm 2)  Pravastatin Sodium 40 Mg Tabs (Pravastatin sodium) .... Take 2 tablet by mouth once a day 3)  Cialis 20 Mg Tabs (Tadalafil) .Marland Kitchen.. 1 by mouth once daily as needed 4)  Adult Aspirin Ec Low Strength 81 Mg Tbec (Aspirin) .Marland Kitchen.. 1 by mouth once daily 5)  Onetouch Ultra Test Strp (Glucose blood) .... Test atleast  three times a day 6)  Hydrocodone-homatropine 5-1.5 Mg/11ml Syrp (Hydrocodone-homatropine) .Marland Kitchen.. 1 tsp by mouth q 6 hrs as needed cough 7)  Proair Hfa 108 (90 Base) Mcg/act Aers (Albuterol sulfate) .... 2 puffs every 4 hours as needed  for sortness of breath 8)  Azithromycin 250 Mg Tabs (Azithromycin) .... 2po qd for 1 day, then 1po qd for 4days, then stop 9)  Prednisone 10 Mg Tabs (Prednisone) .... 3po qd for 3days, then 2po qd for 3days, then 1po qd for 3days, then stop  Patient Instructions: 1)  Discontinue prednisone 2)  Drink plenty of water. 3)  Continue close monitoring of blood sugar. 4)  Continue Glimepiride 4 mg twice daily 5)  Close follow up with Dr. Jonny Ruiz

## 2010-12-06 NOTE — Progress Notes (Signed)
Summary: Med Refill  Phone Note From Pharmacy   Caller: [facility] Summary of Call: [facility] requesting 90 day supply on Januvia and Metformin. Patient is wanting mail order due to lower cost. Is ok to Fill both for 90 days with one refill. Initial call taken by: Scharlene Gloss,  Mar 06, 2010 1:21 PM  Follow-up for Phone Call        routine refills to [facility] Follow-up by: Corwin Levins MD,  Mar 06, 2010 1:41 PM    Prescriptions: METFORMIN HCL 500 MG XR24H-TAB (METFORMIN HCL) 4 by mouth qam  #120 x 1   Entered by:   Scharlene Gloss   Authorized by:   Corwin Levins MD   Signed by:   Scharlene Gloss on 03/06/2010   Method used:   Faxed to ...       [facility]* (mail-order)             ,          Ph: [phone]       Fax: [phone]   RxID:   [id number] JANUVIA 100 MG TABS (SITAGLIPTIN PHOSPHATE) 1po once daily  #90 x 1   Entered by:   Scharlene Gloss   Authorized by:   Corwin Levins MD   Signed by:   Scharlene Gloss on 03/06/2010   Method used:   Faxed to ...       [facility]* (mail-order)             ,          Ph: [phone]       Fax: [phone]   RxID:   [id number]

## 2010-12-06 NOTE — Progress Notes (Signed)
Summary: CBGs  Phone Note Call from Patient Call back at Work Phone [phone]   Caller: Patient Summary of Call: pt called stating that CBGs are not well enough controlled on his current meds. Pt faxed documentation of his CBGs for MD to review. please advise Initial call taken by: Margaret Pyle, CMA,  February 08, 2010 2:14 PM  Follow-up for Phone Call        please inform pt that he can slow down and take his sugars twice per day before meals (o/w gets quite confusing about which are before or after meals and how to interpret);  overall it appears the sugars are persistently elevated on his current med regimen however:  please increase the metformin to 4 per day  Follow-up by: Corwin Levins MD,  February 08, 2010 2:59 PM  Additional Follow-up for Phone Call Additional follow up Details #1::        pt informed via VM. told to call  back with any further questions or concerns Additional Follow-up by: Margaret Pyle, CMA,  February 08, 2010 3:08 PM    New/Updated Medications: METFORMIN HCL 500 MG XR24H-TAB (METFORMIN HCL) 4 by mouth qam Prescriptions: METFORMIN HCL 500 MG XR24H-TAB (METFORMIN HCL) 4 by mouth qam  #120 x 11   Entered and Authorized by:   Corwin Levins MD   Signed by:   Corwin Levins MD on 02/08/2010   Method used:   Electronically to        [facility]  [address] [phone]* (retail)       [address]       [place], [place]  [address]       Ph: [phone] or [phone]       Fax: [phone]   RxID:   [id number]

## 2010-12-06 NOTE — Assessment & Plan Note (Signed)
Summary: 3 mth fu---stc   Vital Signs:  Patient profile:   50 year old male Height:      69.5 inches Weight:      215.50 pounds BMI:     31.48 O2 Sat:      93 % on Room air Temp:     98.1 degrees F oral Pulse rate:   91 / minute BP sitting:   104 / 68  (left arm) Cuff size:   large  Vitals Entered By: Zella Ball Ewing CMA Duncan Dull) (June 16, 2010 8:26 AM)  O2 Flow:  Room air  CC: 3 month ROV/RE   Primary Care Provider:  Corwin Levins MD  CC:  3 month ROV/RE.  History of Present Illness: here overall doing well; Pt denies CP, sob, doe, wheezing, orthopnea, pnd, worsening LE edema, palps, dizziness or syncope  Pt denies new neuro symptoms such as headache, facial or extremity weakness   Pt denies polydipsia, polyuria, or low sugar symptoms such as shakiness improved with eating.  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  Denies signficant change in diet.  Had some mild chest congestion recently without fever and little cough now resolved without blood.  Also had warm feelling to the LE's from hips distal bilat to the feet without pain, weak, numb or lower back pain, bowel or bladder change. No fever, wt loss, night sweats, loss of appetite or other constitutional symptoms   Problems Prior to Update: 1)  Wheezing  (ICD-786.07) 2)  Sinusitis- Acute-nos  (ICD-461.9) 3)  Bronchitis  (ICD-490) 4)  Knee Pain, Right  (ICD-719.46) 5)  Smoker  (ICD-305.1) 6)  Hypersomnia  (ICD-780.54) 7)  Asthma  (ICD-493.90) 8)  Gerd  (ICD-530.81) 9)  Allergic Rhinitis  (ICD-477.9) 10)  Hyperlipidemia  (ICD-272.4) 11)  Preventive Health Care  (ICD-V70.0) 12)  Diabetes Mellitus, Type II  (ICD-250.00)  Medications Prior to Update: 1)  Janumet 50-1000 Mg Tabs (Sitagliptin-Metformin Hcl) .Marland Kitchen.. 1po Two Times A Day 2)   Pravastatin Sodium 40 Mg  Tabs (Pravastatin Sodium) .... Take 2 Tablet By Mouth Once A Day 3)  Cialis 20 Mg  Tabs (Tadalafil) .Marland Kitchen.. 1 By Mouth Once Daily As Needed 4)  Adult Aspirin Ec Low Strength 81 Mg  Tbec (Aspirin) .Marland Kitchen.. 1 By Mouth Once Daily 5)  Onetouch Ultra Test   Strp (Glucose Blood) .... Test Atleast  Three Times A Day 6)  Proair Hfa 108 (90 Base) Mcg/act Aers (Albuterol Sulfate) .... 2 Puffs Every 4 Hours As Needed For Sortness of Breath 7)  Actos 30 Mg Tabs (Pioglitazone Hcl) .Marland Kitchen.. 1po Once Daily  Current Medications (verified): 1)  Janumet 50-1000 Mg Tabs (Sitagliptin-Metformin Hcl) .Marland Kitchen.. 1po Two Times A Day 2)  Pravastatin Sodium 40 Mg  Tabs (Pravastatin Sodium) .... Take 2 Tablet By Mouth Once A Day 3)  Cialis 20 Mg  Tabs (Tadalafil) .Marland Kitchen.. 1 By Mouth Once Daily As Needed 4)  Adult Aspirin Ec Low Strength 81 Mg  Tbec (Aspirin) .Marland Kitchen.. 1 By Mouth Once Daily 5)  Onetouch Ultra Test   Strp (Glucose Blood) .... Test At Least  Three Times A Day 6)  Proair Hfa 108 (90 Base) Mcg/act Aers (Albuterol Sulfate) .... 2 Puffs Every 4 Hours As Needed For Sortness of Breath 7)  Lantus Solostar 100 Unit/ml Soln (  Insulin Glargine) .... Use Asd 10 Units Per Day 8)  Lancets  Misc (Lancets) .... 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   Head:  normocephalic and atraumatic.   Eyes:  vision grossly intact, pupils equal, and pupils round.    Ears:  R ear normal and L ear normal.   Nose:  no external deformity and no nasal discharge.   Mouth:  no gingival abnormalities and pharynx pink and moist.   Neck:  supple and no masses.   Lungs:  normal respiratory effort and normal breath sounds.   Heart:  normal rate and regular rhythm.   Msk:  no back tender Extremities:  no edema, no erythema  Neurologic:  strength grossly normal in all extremities and gait normal.     Impression & Recommendations:  Problem # 1:  DIABETES MELLITUS, TYPE II (ICD-250.00)  His updated medication list for this problem includes:    Janumet 50-1000 Mg Tabs (Sitagliptin-metformin hcl) .Marland Kitchen... 1po two times a day    Adult Aspirin Ec Low Strength 81 Mg Tbec (Aspirin) .Marland Kitchen... 1 by mouth once daily    Lantus Solostar 100 Unit/ml Soln (Insulin glargine) ..... Use asd 10 units per day uncontrolled persistent despite adding actos, liekly due to d/c excercise at the same time per pt;  ok to d/c actos,  start lantus with instructions on titration to effect; encourage more activity and strict following diet  Labs Reviewed: Creat: 0.9 (06/12/2010)    Reviewed HgBA1c results: 8.3 (06/12/2010)  8.6 (03/06/2010)  Problem # 2:  HYPERLIPIDEMIA (ICD-272.4)  His updated medication list for this problem includes:    Pravastatin Sodium 40 Mg Tabs (Pravastatin sodium) .Marland Kitchen... Take 2 tablet by mouth once a day  Labs Reviewed: SGOT: 20 (03/06/2010)   SGPT: 34 (03/06/2010)   HDL:34.40 (06/12/2010), 30.30 (03/06/2010)  LDL:DEL (04/28/2008), DEL (05/29/2007)  Chol:218 (06/12/2010), 139 (03/06/2010)  Trig:616.0 (06/12/2010), 331.0 (03/06/2010) .also worsening, to follow improved diet if possible, cont meds, as well as insulin and better DM control;  if not improved, consider change to lpiitor next vist (will be generic by then, too expensive now)  Problem # 3:  COUGH (ICD-786.2) mild, resolving, suspect recent mild bronchitis - ok to follow for worsening symptoms  Complete  Medication List: 1)  Janumet 50-1000 Mg Tabs (Sitagliptin-metformin hcl) .Marland Kitchen.. 1po two times a day 2)  Pravastatin Sodium 40 Mg Tabs (Pravastatin sodium) .... Take 2 tablet by mouth once a day 3)  Cialis 20 Mg Tabs (Tadalafil) .Marland Kitchen.. 1 by mouth once daily as needed 4)  Adult Aspirin Ec Low Strength 81 Mg Tbec (Aspirin) .Marland Kitchen.. 1 by mouth once daily 5)  Onetouch Ultra Test Strp (Glucose blood) .... Test at least  three times a day 6)  Proair Hfa 108 (90 Base) Mcg/act Aers (Albuterol sulfate) .... 2 puffs every 4 hours as needed for sortness of breath 7)  Lantus Solostar 100 Unit/ml Soln (Insulin glargine) .... Use asd 10 units per day 8)  Lancets Misc (Lancets) .... Use asd three times a day  Patient Instructions: 1)  start the lantus at 10 units per day 2)  stop the actos 3)  Continue all previous medications as before this visit  4)  You can increase the lantus at home by determining the average blood sugar over 3 days (checking at last 2 to 3 times per day);  if the average over 3 days is > 175, you should increase the lantus by 3 units;  please do not be concerned if you end up taking a relatively large amount as the average for lantus per day is often 50 units or so 5)  Plesae try to be more active as you can, and follow the lower cholesterol, Diabetic diet 6)  Please schedule a follow-up appointment in 3 months with: 7)  BMP prior to visit, ICD-9: 250.02 8)  Lipid Panel prior to visit, ICD-9: 9)  HbgA1C prior to visit, ICD-9: Prescriptions: LANCETS  MISC (LANCETS) use asd three times a day  #300 x 11   Entered and Authorized by:   Corwin Levins MD   Signed by:   Corwin Levins MD on 06/16/2010   Method used:   Print then Give to Patient   RxID:   [id number] ONETOUCH ULTRA TEST   STRP (GLUCOSE BLOOD) test at least  three times a day  #300 x 11   Entered and Authorized by:   Corwin Levins MD   Signed by:   Corwin Levins MD on 06/16/2010   Method used:   Print then Give to Patient   RxID:    [id number] LANTUS SOLOSTAR 100 UNIT/ML SOLN (INSULIN GLARGINE) use asd 10 units per day  #5 pens x 3   Entered and Authorized by:   Corwin Levins MD   Signed by:   Corwin Levins MD on 06/16/2010   Method used:   Print then Give to Patient   RxID:   [id number]

## 2010-12-06 NOTE — Assessment & Plan Note (Signed)
Summary: 3 mo rov /nws  #   Vital Signs:  Patient profile:   50 year old male Height:      68.5 inches Weight:      216.50 pounds BMI:     32.56 O2 Sat:      96 % on Room air Temp:     98.2 degrees F oral Pulse rate:   93 / minute BP sitting:   100 / 64  (left arm) Cuff size:   large  Vitals Entered By: Zella Ball Ewing CMA (AAMA) (September 15, 2010 9:00 AM)  O2 Flow:  Room air CC: 3 month ROV/RE   Primary Care Antionne Enrique:  Corwin Levins MD  CC:  3 month ROV/RE.  History of Present Illness: here for f/u;  overall doing ok;  Pt denies CP, worsening sob, doe, wheezing, orthopnea, pnd, worsening LE edema, palps, dizziness or syncope  Pt denies new neuro symptoms such as headache, facial or extremity weakness  Pt denies polydipsia, polyuria, or low sugar symptoms such as shakiness improved with eating.  Overall good compliance with meds, trying to follow low chol, DM diet, wt stable, little excercise however  No fever, wt loss, night sweats, loss of appetite or other constitutional symptoms Denies worsening depressive symptoms, suicidal ideation, or panic.  Good med tolerability,  no wheezing, fatigue or nighttimw awakenings with wheezng.   No overt reflux, CP, abd pain, n/v, dysphagia, wt loss, bowel change or blood  Preventive Screening-Counseling & Management      Drug Use:  no.    Problems Prior to Update: 1)  Back Pain  (ICD-724.5) 2)  Cough  (ICD-786.2) 3)  Wheezing  (ICD-786.07) 4)  Sinusitis- Acute-nos  (ICD-461.9) 5)  Bronchitis  (ICD-490) 6)  Knee Pain, Right  (ICD-719.46) 7)  Smoker  (ICD-305.1) 8)  Hypersomnia  (ICD-780.54) 9)  Asthma  (ICD-493.90) 10)  Gerd  (ICD-530.81) 11)  Allergic Rhinitis  (ICD-477.9) 12)  Hyperlipidemia  (ICD-272.4) 13)  Preventive Health Care  (ICD-V70.0) 14)  Diabetes Mellitus, Type II  (ICD-250.00)  Medications Prior to Update: 1)  Janumet 50-1000 Mg Tabs (Sitagliptin-Metformin Hcl) .Marland Kitchen.. 1po Two Times A Day 2)  Pravastatin Sodium 40 Mg   Tabs (Pravastatin Sodium) .... Take 2 Tablet By Mouth Once A Day 3)  Cialis 20 Mg  Tabs (Tadalafil) .Marland Kitchen.. 1 By Mouth Once Daily As Needed 4)  Adult Aspirin Ec Low Strength 81 Mg  Tbec (Aspirin) .Marland Kitchen.. 1 By Mouth Once Daily 5)  Onetouch Ultra Test   Strp (Glucose Blood) .... Test At Least  Three Times A Day 6)  Proair Hfa 108 (90 Base) Mcg/act Aers (Albuterol Sulfate) .... 2 Puffs Every 4 Hours As Needed For Sortness of Breath 7)  Lantus Solostar 100 Unit/ml Soln (Insulin Glargine) .... Use Asd 10 Units Per Day 8)  Lancets  Misc (Lancets) .... Use Asd Three Times A Day 9)  Medrol (Pak) 4 Mg Tabs (Methylprednisolone) .... Use Asd  - 1 Pack 10)  Azithromycin 250 Mg Tabs (Azithromycin) .... 2po Qd For 1 Day, Then 1po Qd For 4days, Then Stop 11)  Pen Needles 5/16" 31g X 8 Mm Misc (Insulin Pen Needle) .... Use As Directed Once Daily  Current Medications (verified): 1)  Janumet 50-1000 Mg Tabs (Sitagliptin-Metformin Hcl) .Marland Kitchen.. 1po Two Times A Day 2)  Pravastatin Sodium 40 Mg  Tabs (Pravastatin Sodium) .... Take 2 Tablet By Mouth Once A Day 3)  Cialis 20 Mg  Tabs (Tadalafil) .Marland Kitchen.. 1 By Mouth Once Daily  As Needed 4)  Adult Aspirin Ec Low Strength 81 Mg  Tbec (Aspirin) .Marland Kitchen.. 1 By Mouth Once Daily 5)  Onetouch Ultra Test   Strp (Glucose Blood) .... Test At Least  Three Times A Day 6)  Proair Hfa 108 (90 Base) Mcg/act Aers (Albuterol Sulfate) .... 2 Puffs Every 4 Hours As Needed For Sortness of Breath 7)  Lantus Solostar 100 Unit/ml Soln (Insulin Glargine) .... Use Asd 20 Units Per Day 8)  Lancets  Misc (Lancets) .... Use Asd Three Times A Day 9)  Pen Needles 5/16" 31g X 8 Mm Misc (Insulin Pen Needle) .... 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  Head:  normocephalic and atraumatic.   Eyes:  vision grossly intact, pupils equal, and pupils round.   Ears:  R ear normal and L ear normal.   Nose:  no external deformity and no nasal discharge.   Mouth:  no gingival abnormalities and pharynx pink and moist.   Neck:  supple and no masses.   Lungs:  normal respiratory effort and normal breath sounds.   Heart:  normal rate and regular rhythm.   Extremities:  no edema, no erythema    Impression & Recommendations:  Problem # 1:  DIABETES MELLITUS, TYPE II (ICD-250.00)  His updated medication list for this problem includes:    Janumet 50-1000 Mg Tabs (Sitagliptin-metformin hcl) .Marland Kitchen... 1po two times a day    Adult Aspirin Ec Low Strength 81 Mg Tbec (Aspirin) .Marland Kitchen... 1 by mouth once daily    Lantus Solostar 100 Unit/ml Soln (Insulin glargine) ..... Use asd 20 units per day  Labs Reviewed: Creat: 1.1 (09/12/2010)    Reviewed HgBA1c results: 8.0 (09/12/2010)  8.3 (06/12/2010) uncontrolled - to increase the lantus to 20 units,  Pt to cont DM diet, excercise, wt control efforts; to check labs next visit  Problem # 2:  HYPERLIPIDEMIA (ICD-272.4)  His updated medication list for this problem includes:    Pravastatin Sodium 40 Mg Tabs (Pravastatin sodium) .Marland Kitchen... Take 2 tablet by mouth once a day  Labs Reviewed: SGOT: 20 (03/06/2010)   SGPT: 34 (03/06/2010)   HDL:34.50 (09/12/2010), 34.40 (06/12/2010)  LDL:DEL (04/28/2008), DEL (05/29/2007)  Chol:157 (09/12/2010), 218 (06/12/2010)  Trig:280.0  (09/12/2010), 616.0 (06/12/2010) stable overall by hx and exam, ok to continue meds/tx as is   Problem # 3:  ASTHMA (ICD-493.90)  The following medications were removed from the medication list:    Medrol (pak) 4 Mg Tabs (Methylprednisolone) ..... Use asd  - 1 pack His updated medication list for this problem includes:    Proair Hfa 108 (90 Base) Mcg/act Aers (Albuterol sulfate) .Marland Kitchen... 2 puffs every 4 hours as needed for sortness of breath stable overall by hx and exam, ok to continue meds/tx as is   Problem # 4:  GERD (ICD-530.81) stable overall by hx and  exam, ok to continue meds/tx as is  - no need med at this time  Complete Medication List: 1)  Janumet 50-1000 Mg Tabs (Sitagliptin-metformin hcl) .Marland Kitchen.. 1po two times a day 2)  Pravastatin Sodium 40 Mg Tabs (Pravastatin sodium) .... Take 2 tablet by mouth once a day 3)  Cialis 20 Mg Tabs (Tadalafil) .Marland Kitchen.. 1 by mouth once daily as needed 4)  Adult Aspirin Ec Low Strength 81 Mg Tbec (Aspirin) .Marland Kitchen.. 1 by mouth once daily 5)  Onetouch Ultra Test Strp (Glucose blood) .... Test at least  three times a day 6)  Proair Hfa 108 (90 Base) Mcg/act Aers (Albuterol sulfate) .... 2 puffs every 4 hours as needed for sortness of breath 7)  Lantus Solostar 100 Unit/ml Soln (Insulin glargine) .... Use asd 20 units per day 8)  Lancets Misc (Lancets) .... Use asd three times a day 9)  Pen Needles 5/16" 31g X 8 Mm Misc (Insulin pen needle) .... Use as directed once daily  Patient Instructions: 1)  Continue all previous medications as before this visit, except increase the lantus to 20 units per day 2)  continue to monitor your sugars, and call for persistent sugars more than 150 3)  Please schedule a CPX in 6 months, or sooner if needed with: 4)  CPX labs and: 5)  HbgA1C prior to visit, ICD-9: 250.02 6)  Urine Microalbumin prior to visit, ICD-9: Prescriptions: JANUMET 50-1000 MG TABS (SITAGLIPTIN-METFORMIN HCL) 1po two times a day  #180 x 3   Entered and  Authorized by:   Corwin Levins MD   Signed by:   Corwin Levins MD on 09/15/2010   Method used:   Print then Give to Patient   RxID:   [id number] LANTUS SOLOSTAR 100 UNIT/ML SOLN (INSULIN GLARGINE) use asd 20 units per day  #5pens x 11   Entered and Authorized by:   Corwin Levins MD   Signed by:   Corwin Levins MD on 09/15/2010   Method used:   Print then Give to Patient   RxID:   [id number]    Orders Added: 1)  Est. Patient Level IV [02725]

## 2010-12-06 NOTE — Miscellaneous (Signed)
Summary: [facility]  Clinical Lists Changes  Medications: Added new medication of MOVIPREP 100 GM  SOLR (PEG-KCL-NACL-NASULF-NA ASC-C) As per prep instructions. - Signed Rx of MOVIPREP 100 GM  SOLR (PEG-KCL-NACL-NASULF-NA ASC-C) As per prep instructions.;  #1 x 0;  Signed;  Entered by: Ezra Sites RN;  Authorized by: Rachael Fee MD;  Method used: [facility]  [address] [phone]*, [address] [place], [place], [place]  [address], Ph: [phone] or [phone], Fax: [phone] Observations: Added new observation of NKA: T (05/01/2010 8:15)    Prescriptions: MOVIPREP 100 GM  SOLR (PEG-KCL-NACL-NASULF-NA ASC-C) As per prep instructions.  #1 x 0   Entered by:   Ezra Sites RN   Authorized by:   Rachael Fee MD   Signed by:   Ezra Sites RN on 05/01/2010   Method used:   [facility] to        [facility]  [address] [phone]* (retail)       [address]       [place], [place]  [address]       Ph: [phone] or [phone]       Fax: [phone]   RxID:   [id number]

## 2010-12-06 NOTE — Assessment & Plan Note (Signed)
Summary: f/u on blood sugar/cd   Vital Signs:  Patient profile:   50 year old male Height:      68 inches Weight:      221 pounds BMI:     33.72 O2 Sat:      96 % on Room air Temp:     97.3 degrees F oral Pulse rate:   99 / minute BP sitting:   110 / 62  (left arm) Cuff size:   large  Vitals Entered ByZella Ball Ewing (January 25, 2010 3:04 PM)  O2 Flow:  Room air CC: followup on Blood Sugar/RE   Primary Care Provider:  Corwin Levins MD  CC:  followup on Blood Sugar/RE.  History of Present Illness: here with recent elevation of blood sugar on prednisone (pulm symptoms now resolved) but also with wrong prescription filling per pharmacy resulting in HALF usual glimeparide; Pt denies CP, sob, doe, wheezing, orthopnea, pnd, worsening LE edema, palps, dizziness or syncope   Pt denies new neuro symptoms such as headache, facial or extremity weakness   Pt denies polydipsia, polyuria, or low sugar symptoms such as shakiness improved with eating.  Overall good compliance with meds, trying to follow low chol, DM diet, wt stable, little excercise however  CBG today and yesterday in the high 100's despite correct glimeparide.  Wife and pt worried about low sugars on more meds as he drives for work frequently.    Problems Prior to Update: 1)  Wheezing  (ICD-786.07) 2)  Sinusitis- Acute-nos  (ICD-461.9) 3)  Bronchitis  (ICD-490) 4)  Knee Pain, Right  (ICD-719.46) 5)  Smoker  (ICD-305.1) 6)  Hypersomnia  (ICD-780.54) 7)  Asthma  (ICD-493.90) 8)  Gerd  (ICD-530.81) 9)  Allergic Rhinitis  (ICD-477.9) 10)  Hyperlipidemia  (ICD-272.4) 11)  Preventive Health Care  (ICD-V70.0) 12)  Diabetes Mellitus, Type II  (ICD-250.00)  Medications Prior to Update: 1)  Glimepiride 4 Mg  Tabs (Glimepiride) .Marland Kitchen.. 1 By Mouth Qam and 1/2 By Mouth in The Pm 2)  Pravastatin Sodium 40 Mg  Tabs (Pravastatin Sodium) .... Take 2 Tablet By Mouth Once A Day 3)  Cialis 20 Mg  Tabs (Tadalafil) .Marland Kitchen.. 1 By Mouth Once Daily As  Needed 4)  Adult Aspirin Ec Low Strength 81 Mg  Tbec (Aspirin) .Marland Kitchen.. 1 By Mouth Once Daily 5)  Onetouch Ultra Test   Strp (Glucose Blood) .... Test Atleast  Three Times A Day 6)  Hydrocodone-Homatropine 5-1.5 Mg/83ml Syrp (Hydrocodone-Homatropine) .Marland Kitchen.. 1 Tsp By Mouth Q 6 Hrs As Needed Cough 7)  Proair Hfa 108 (90 Base) Mcg/act Aers (Albuterol Sulfate) .... 2 Puffs Every 4 Hours As Needed For Sortness of Breath 8)  Azithromycin 250 Mg Tabs (Azithromycin) .... 2po Qd For 1 Day, Then 1po Qd For 4days, Then Stop 9)  Prednisone 10 Mg Tabs (Prednisone) .... 3po Qd For 3days, Then 2po Qd For 3days, Then 1po Qd For 3days, Then Stop  Current Medications (verified): 1)  Januvia 100 Mg Tabs (Sitagliptin Phosphate) .Marland Kitchen.. 1po Once Daily 2)  Pravastatin Sodium 40 Mg  Tabs (Pravastatin Sodium) .... Take 2 Tablet By Mouth Once A Day 3)  Cialis 20 Mg  Tabs (Tadalafil) .Marland Kitchen.. 1 By Mouth Once Daily As Needed 4)  Adult Aspirin Ec Low Strength 81 Mg  Tbec (Aspirin) .Marland Kitchen.. 1 By Mouth Once Daily 5)  Onetouch Ultra Test   Strp (Glucose Blood) .... Test Atleast  Three Times A Day 6)  Hydrocodone-Homatropine 5-1.5 Mg/97ml Syrp (Hydrocodone-Homatropine) .Marland Kitchen.. 1 Tsp  By Mouth Q 6 Hrs As Needed Cough 7)  Proair Hfa 108 (90 Base) Mcg/act Aers (Albuterol Sulfate) .... 2 Puffs Every 4 Hours As Needed For Sortness of Breath 8)  Metformin Hcl 500 Mg Xr24h-Tab (Metformin Hcl) .... 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   Head:  normocephalic and atraumatic.   Eyes:  vision grossly intact, pupils equal, and pupils round.   Ears:  R ear normal and L ear normal.   Nose:  no external deformity and no nasal discharge.   Mouth:  no gingival abnormalities and pharynx pink and moist.   Neck:  supple and no masses.   Lungs:  normal respiratory effort and normal breath sounds.   Heart:  normal rate and regular rhythm.   Extremities:  no edema, no erythema    Impression & Recommendations:  Problem # 1:  DIABETES MELLITUS, TYPE II (ICD-250.00)  His updated medication list for this problem includes:    Januvia 100 Mg Tabs (Sitagliptin phosphate) .Marland Kitchen... 1po once daily    Adult Aspirin Ec Low Strength 81 Mg Tbec (Aspirin) .Marland Kitchen... 1 by mouth once daily    Metformin Hcl 500 Mg Xr24h-tab (Metformin hcl) .Marland Kitchen... 2 by mouth qam meds to be adjusted to 2 meds above with less chance of low sugars and overall better control  Labs Reviewed: Creat: 1.0 (09/08/2009)    Reviewed HgBA1c results: 7.7 (09/08/2009)  6.7 (03/10/2009)  Problem # 2:  WHEEZING (ICD-786.07) resolved, pt reassured, no further tx needed  Complete Medication List: 1)  Januvia 100 Mg Tabs (Sitagliptin phosphate) .Marland Kitchen.. 1po once daily 2)  Pravastatin Sodium 40 Mg Tabs (Pravastatin sodium) .... Take 2 tablet by mouth once a day 3)  Cialis 20 Mg Tabs (Tadalafil) .Marland Kitchen.. 1 by mouth once daily as needed 4)  Adult Aspirin Ec Low Strength 81 Mg Tbec (Aspirin) .Marland Kitchen.. 1 by mouth once daily 5)  Onetouch Ultra Test Strp (Glucose blood) .... Test atleast  three times a day 6)  Hydrocodone-homatropine 5-1.5 Mg/57ml Syrp (Hydrocodone-homatropine) .Marland Kitchen.. 1 tsp by mouth q 6 hrs as needed cough 7)  Proair Hfa 108 (90 Base) Mcg/act Aers (Albuterol sulfate) .... 2 puffs every 4 hours as needed for sortness of breath 8)  Metformin Hcl 500 Mg Xr24h-tab (Metformin hcl) .... 2 by mouth qam  Patient Instructions: 1)  stop the  glimeparide 2)  start the januvia 100 mg per day (one month sample given) 3)  start the metformin as prescribed ([facility]) 4)  you are given the 90 day prescriptions as well 5)  Continue all previous medications as before this visit 6)  Please schedule a follow-up appointment as already planned (I believe it was 6 months from the last Nov 2010  - with CPX labs and: 7)  HbgA1C prior to visit, ICD-9: 250.02 8)  Urine Microalbumin prior to visit, ICD-9: Prescriptions: METFORMIN HCL 500 MG XR24H-TAB (METFORMIN HCL) 2 by mouth qam  #180 x 3  Entered and Authorized by:   Corwin Levins MD   Signed by:   Corwin Levins MD on 01/25/2010   Method used:   Print then Give to Patient   RxID:   [id number] METFORMIN HCL 500 MG XR24H-TAB (METFORMIN HCL) 2 by mouth qam  #60 x 11   Entered and Authorized by:   Corwin Levins MD   Signed by:   Corwin Levins MD on 01/25/2010   Method used:   [facility]        [facility]  [address] [phone]* (retail)       [address]       [place], [place]  [address]       Ph: [phone] or [phone]       Fax: [phone]   RxID:   [id number] JANUVIA 100 MG TABS (SITAGLIPTIN PHOSPHATE) 1po once daily  #90 x 3   Entered and Authorized by:   Corwin Levins MD   Signed by:   Corwin Levins MD on 01/25/2010   Method used:   Print then Give to Patient   RxID:   [id number]

## 2010-12-06 NOTE — Progress Notes (Signed)
Summary: Pharmacy change  Phone Note Refill Request Message from:  Patient on September 05, 2010 2:03 PM  Refills Requested: Medication #1:  PRAVASTATIN SODIUM 40 MG  TABS Take 2 tablet by mouth once a day Pt [facility] new Rx [facility] [place]   Method Requested: Electronic Initial call taken by: Margaret Pyle, CMA,  September 05, 2010 2:03 PM    Prescriptions: PRAVASTATIN SODIUM 40 MG  TABS (PRAVASTATIN SODIUM) Take 2 tablet by mouth once a day  #180 x 1   Entered by:   Margaret Pyle, CMA   Authorized by:   Minus Breeding MD   Signed by:   Margaret Pyle, CMA on 09/05/2010   Method used:   [facility]        [facility]  [address] [phone]* (retail)       [address]       [place], [place]  [address]       Ph: [phone] or [phone]       Fax: [phone]   RxID:   [id number]

## 2010-12-06 NOTE — Progress Notes (Signed)
Summary: Pen needle refill  Phone Note Refill Request Message from:  Spouse Tobi Bastos on June 30, 2010 1:50 PM  Refills Requested: Medication #1:  Pen needles for insulin Initial call taken by: Lucious Groves CMA,  June 30, 2010 1:50 PM    New/Updated Medications: PEN NEEDLES 5/16" 31G X 8 MM MISC (INSULIN PEN NEEDLE) use as directed once daily Prescriptions: PEN NEEDLES 5/16" 31G X 8 MM MISC (INSULIN PEN NEEDLE) use as directed once daily  #100 x 3   Entered by:   Margaret Pyle, CMA   Authorized by:   Corwin Levins MD   Signed by:   Margaret Pyle, CMA on 06/30/2010   Method used:   [facility]        [facility]  [address] [phone]* (retail)       [address]       [place], [place]  [address]       Ph: [phone] or [phone]       Fax: [phone]   RxID:   [id number]

## 2010-12-06 NOTE — Letter (Signed)
Summary: Diabetic Instructions  [facility]  [address] [place], [place] [address]   Phone: [phone]  Fax: [phone]    Wayne Diaz September 13, 1961 MRN: [id number]   _  _   ORAL DIABETIC MEDICATION INSTRUCTIONS  The day before your procedure:   Take your diabetic pill as you do normally  The day of your procedure:   Do not take your diabetic pill    We will check your blood sugar levels during the admission process and again in Recovery before discharging you home  ________________________________________________________________________

## 2010-12-06 NOTE — Letter (Signed)
Summary: Previsit letter  [facility]  [address] [place], [place] [address]   Phone: [phone]  Fax: [phone]       03/31/2010 MRN: [id number]  [facility] [address] [place], [place]  [address]  Dear Wayne Diaz,  Welcome to the Gastroenterology Division at [facility].    You are scheduled to see a nurse for your pre-procedure visit on 05-01-10 at 8:00a.m. on the 3rd floor at [facility], [address]  We ask that you try to arrive at our office 15 minutes prior to your appointment time to allow for check-in.  Your nurse visit will consist of discussing your medical and surgical history, your immediate family medical history, and your medications.    Please bring a complete list of all your medications or, if you prefer, bring the medication bottles and we will list them.  We will need to be aware of both prescribed and over the counter drugs.  We will need to know exact dosage information as well.  If you are on blood thinners (Coumadin, Plavix, Aggrenox, Ticlid, etc.) please call our office today/prior to your appointment, as we need to consult with your physician about holding your medication.   Please be prepared to read and sign documents such as consent forms, a financial agreement, and acknowledgement forms.  If necessary, and with your consent, a friend or relative is welcome to sit-in on the nurse visit with you.  Please bring your insurance card so that we may make a copy of it.  If your insurance requires a referral to see a specialist, please bring your referral form from your primary care physician.  No co-pay is required for this nurse visit.     If you cannot keep your appointment, please call [phone] to cancel or reschedule prior to your appointment date.  This allows [place] the opportunity to schedule an appointment for another patient in need of care.    Thank you for choosing [facility] for your medical needs.  We  appreciate the opportunity to care for you.  Please visit [place] at our website  to learn more about our practice.                     Sincerely.                                                                                                                   The Gastroenterology Division

## 2010-12-06 NOTE — Progress Notes (Signed)
  Phone Note Refill Request  on February 28, 2010 8:26 AM  Refills Requested: Medication #1:  PRAVASTATIN SODIUM 40 MG  TABS Take 2 tablet by mouth once a day   Dosage confirmed as above?Dosage Confirmed   Notes: [facility] Initial call taken by: Scharlene Gloss,  February 28, 2010 8:27 AM    Prescriptions: PRAVASTATIN SODIUM 40 MG  TABS (PRAVASTATIN SODIUM) Take 2 tablet by mouth once a day  #180 x 3   Entered by:   Scharlene Gloss   Authorized by:   Corwin Levins MD   Signed by:   Scharlene Gloss on 02/28/2010   Method used:   Faxed to ...       [facility]* (mail-order)             ,          Ph: [phone]       Fax: [phone]   RxID:   [id number]

## 2010-12-06 NOTE — Assessment & Plan Note (Signed)
Summary: CPX / NWS #   Vital Signs:  Patient profile:   50 year old male Height:      69 inches Weight:      213 pounds BMI:     31.57 O2 Sat:      95 % on Room air Temp:     977 degrees F oral Pulse rate:   84 / minute BP sitting:   110 / 72  (left arm) Cuff size:   large  Vitals Entered ByZella Ball Ewing (Mar 10, 2010 8:37 AM)  O2 Flow:  Room air  Preventive Care Screening     declines tetanus  CC: Adult Physical/RE   Primary Care Provider:  Corwin Levins MD  CC:  Adult Physical/RE.  History of Present Illness: lost over 20 lbs iwth better diet and excercise, good complaince with meds, but cbg's still in teh 200's;  Pt denies CP, sob, doe, wheezing, orthopnea, pnd, worsening LE edema, palps, dizziness or syncope  Pt denies new neuro symptoms such as headache, facial or extremity weakness   Pt denies polydipsia, polyuria, or low sugar symptoms such as shakiness improved with eating.  Overall good compliance with meds, trying to follow low chol, DM diet, wt stable, little excercise howeve and CBG's still in the 200's.    Problems Prior to Update: 1)  Wheezing  (ICD-786.07) 2)  Sinusitis- Acute-nos  (ICD-461.9) 3)  Bronchitis  (ICD-490) 4)  Knee Pain, Right  (ICD-719.46) 5)  Smoker  (ICD-305.1) 6)  Hypersomnia  (ICD-780.54) 7)  Asthma  (ICD-493.90) 8)  Gerd  (ICD-530.81) 9)  Allergic Rhinitis  (ICD-477.9) 10)  Hyperlipidemia  (ICD-272.4) 11)  Preventive Health Care  (ICD-V70.0) 12)  Diabetes Mellitus, Type II  (ICD-250.00)  Medications Prior to Update: 1)  Januvia 100 Mg Tabs (Sitagliptin Phosphate) .Marland Kitchen.. 1po Once Daily 2)  Pravastatin Sodium 40 Mg  Tabs (Pravastatin Sodium) .... Take 2 Tablet By Mouth Once A Day 3)  Cialis 20 Mg  Tabs (Tadalafil) .Marland Kitchen.. 1 By Mouth Once Daily As Needed 4)  Adult Aspirin Ec Low Strength 81 Mg  Tbec (Aspirin) .Marland Kitchen.. 1 By Mouth Once Daily 5)  Onetouch Ultra Test   Strp (Glucose Blood) .... Test Atleast  Three Times A Day 6)   Hydrocodone-Homatropine 5-1.5 Mg/5ml Syrp (Hydrocodone-Homatropine) .Marland Kitchen.. 1 Tsp By Mouth Q 6 Hrs As Needed Cough 7)  Proair Hfa 108 (90 Base) Mcg/act Aers (Albuterol Sulfate) .... 2 Puffs Every 4 Hours As Needed For Sortness of Breath 8)  Metformin Hcl 500 Mg Xr24h-Tab (Metformin Hcl) .... 4 By Mouth Qam  Current Medications (verified): 1)  Janumet 50-1000 Mg Tabs (Sitagliptin-Metformin Hcl) .Marland Kitchen.. 1po Two Times A Day 2)  Pravastatin Sodium 40 Mg  Tabs (Pravastatin Sodium) .... Take 2 Tablet By Mouth Once A Day 3)  Cialis 20 Mg  Tabs (Tadalafil) .Marland Kitchen.. 1 By Mouth Once Daily As Needed 4)  Adult Aspirin Ec Low Strength 81 Mg  Tbec (Aspirin) .Marland Kitchen.. 1 By Mouth Once Daily 5)  Onetouch Ultra Test   Strp (Glucose Blood) .... Test Atleast  Three Times A Day 6)  Proair Hfa 108 (90 Base) Mcg/act Aers (Albuterol Sulfate) .... 2 Puffs Every 4 Hours As Needed For Sortness of Breath 7)  Actos 30 Mg Tabs (Pioglitazone Hcl) .Marland Kitchen.. 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         all otherwise negative per pt -    Physical Exam  General:  alert and overweight-appearing.   Head:  normocephalic and atraumatic.   Eyes:  vision grossly intact, pupils equal, and pupils round.   Ears:  R ear normal and L ear normal.   Nose:  no external deformity and no nasal discharge.   Mouth:  no gingival abnormalities and pharynx pink and moist.   Neck:  supple and no masses.   Lungs:  normal respiratory effort and normal breath sounds.   Heart:  normal rate and regular rhythm.   Abdomen:  soft, non-tender, and normal bowel sounds.   Msk:  no joint tenderness and no joint swelling.   Extremities:  no edema, no erythema  Neurologic:  cranial nerves II-XII intact and strength normal in all extremities.     Impression & Recommendations:  Problem # 1:  Preventive Health Care (ICD-V70.0)  Overall doing well, age appropriate education and counseling updated and referral for appropriate preventive services done unless declined, immunizations up to date or declined, diet counseling done if overweight, urged to quit smoking if smokes , most recent labs reviewed and current ordered if appropriate, ecg reviewed or declined (interpretation per ECG scanned in the EMR if done); information regarding Medicare Prevention requirements given if appropriate; speciality referrals updated as appropriate   Orders: Gastroenterology Referral (GI) EKG w/ Interpretation ([address])  Problem # 2:  DIABETES MELLITUS, TYPE II (ICD-250.00)  His updated medication list for this problem includes:    Janumet 50-1000 Mg Tabs (Sitagliptin-metformin hcl) .Marland Kitchen... 1po two times a day    Adult Aspirin Ec Low Strength 81 Mg Tbec (Aspirin) .Marland Kitchen... 1 by mouth once daily    Actos 30 Mg Tabs (Pioglitazone hcl) .Marland Kitchen... 1po once daily  Labs Reviewed: Creat: 1.0 (03/06/2010)    Reviewed HgBA1c results: 8.6 (03/06/2010)  7.7 (09/08/2009) treat as above, f/u any worsening signs or symptoms  - to  add the actos, cont to check CBG's  Complete Medication List: 1)  Janumet 50-1000 Mg Tabs (Sitagliptin-metformin hcl) .Marland Kitchen.. 1po two times a day 2)  Pravastatin Sodium 40 Mg Tabs (Pravastatin sodium) .... Take 2 tablet by mouth once a day 3)  Cialis 20 Mg Tabs (Tadalafil) .Marland Kitchen.. 1 by mouth once daily as needed 4)  Adult Aspirin Ec Low Strength 81 Mg Tbec (Aspirin) .Marland Kitchen.. 1 by mouth once daily 5)  Onetouch Ultra Test Strp (Glucose blood) .... Test atleast  three times a day 6)  Proair Hfa 108 (90 Base) Mcg/act Aers (Albuterol sulfate) .... 2 puffs every 4 hours as needed for sortness of breath 7)  Actos 30 Mg Tabs (Pioglitazone hcl) .Marland Kitchen.. 1po once daily  Other Orders: Pneumococcal Vaccine ([address]) Admin 1st Vaccine ([address])  Patient Instructions: 1)  you had the pneumonia shot today 2)  You will be contacted about the referral(s) to: colonoscopy 3)  stop the [place] 4)  stop the metformin  5)  start the janumet 50/1000 two times a day  6)  start the actos 30 mg per day 7)  Continue all previous medications as before this visit  8)  Please schedule a follow-up appointment in 3 months with: 9)  BMP prior to visit, ICD-9: 250.02 10)  Lipid Panel prior to visit, ICD-9: 11)  HbgA1C prior to visit, ICD-9: Prescriptions: CIALIS 20 MG  TABS (TADALAFIL) 1 by mouth once daily as needed  #5 x 11   Entered and Authorized by:   Corwin Levins MD   Signed by:   Corwin Levins MD on 03/10/2010   Method used:   Print then Give to Patient   RxID:   [id number] ACTOS 30 MG TABS (PIOGLITAZONE HCL) 1po once daily  #30 x 11   Entered and Authorized by:   Corwin Levins MD   Signed by:   Corwin Levins MD on 03/10/2010   Method used:   Print then Give to Patient   RxID:   [id number] JANUMET 50-1000 MG TABS (SITAGLIPTIN-METFORMIN HCL) 1po two times a day  #60 x 11   Entered and Authorized by:   Corwin Levins MD   Signed by:   Corwin Levins MD on 03/10/2010   Method used:   Print then Give to Patient    RxID:   [id number]    Immunizations Administered:  Pneumonia Vaccine:    Vaccine Type: Pneumovax    Site: right deltoid    Mfr: Merck    Dose: 0.5 ml    Route: IM    Given by: Zella Ball Ewing    Exp. Date: 06/17/2011    Lot #: [id number]    VIS given: 06/02/96 version given Mar 10, 2010.

## 2010-12-07 NOTE — Progress Notes (Signed)
Summary: referral  Phone Note Call from Patient Call back at Home Phone [phone]   Caller: Patient Summary of Call: Pt called stating he experienced some type of trauma to his knee over the weekend and is requesting referral to Ortho ASAP. Initial call taken by: Margaret Pyle, CMA,  October 23, 2010 9:03 AM  Follow-up for Phone Call        done per emr  please forward preference for which ortho to PCC's if pt has a preference. Follow-up by: Corwin Levins MD,  October 23, 2010 12:42 PM  Additional Follow-up for Phone Call Additional follow up Details #1::        No preference - to [facility] Additional Follow-up by: Margaret Pyle, CMA,  October 23, 2010 1:33 PM  New Problems: KNEE PAIN, ACUTE (FUX-323.55)   Additional Follow-up for Phone Call Additional follow up Details #2::    Appt scheduled for Dec,19,2011@2 :30 pm Dr Ok Edwards orto   New Problems: KNEE PAIN, ACUTE (ICD-719.46)

## 2011-01-21 LAB — GLUCOSE, CAPILLARY: Glucose-Capillary: 193 mg/dL — ABNORMAL HIGH (ref 70–99)

## 2011-02-28 ENCOUNTER — Other Ambulatory Visit: Payer: Self-pay | Admitting: Internal Medicine

## 2011-03-09 ENCOUNTER — Other Ambulatory Visit: Payer: Self-pay | Admitting: Internal Medicine

## 2011-03-09 ENCOUNTER — Other Ambulatory Visit: Payer: BC Managed Care – PPO

## 2011-03-09 DIAGNOSIS — Z1289 Encounter for screening for malignant neoplasm of other sites: Secondary | ICD-10-CM

## 2011-03-09 DIAGNOSIS — IMO0001 Reserved for inherently not codable concepts without codable children: Secondary | ICD-10-CM

## 2011-03-09 DIAGNOSIS — Z0000 Encounter for general adult medical examination without abnormal findings: Secondary | ICD-10-CM

## 2011-03-13 ENCOUNTER — Other Ambulatory Visit (INDEPENDENT_AMBULATORY_CARE_PROVIDER_SITE_OTHER): Payer: BC Managed Care – PPO

## 2011-03-13 ENCOUNTER — Other Ambulatory Visit: Payer: Self-pay | Admitting: *Deleted

## 2011-03-13 DIAGNOSIS — IMO0001 Reserved for inherently not codable concepts without codable children: Secondary | ICD-10-CM

## 2011-03-13 DIAGNOSIS — Z0389 Encounter for observation for other suspected diseases and conditions ruled out: Secondary | ICD-10-CM

## 2011-03-13 DIAGNOSIS — Z1289 Encounter for screening for malignant neoplasm of other sites: Secondary | ICD-10-CM

## 2011-03-13 DIAGNOSIS — Z0000 Encounter for general adult medical examination without abnormal findings: Secondary | ICD-10-CM

## 2011-03-13 DIAGNOSIS — Z13220 Encounter for screening for lipoid disorders: Secondary | ICD-10-CM

## 2011-03-13 LAB — BASIC METABOLIC PANEL
CO2: 28 mEq/L (ref 19–32)
Chloride: 103 mEq/L (ref 96–112)
Sodium: 139 mEq/L (ref 135–145)

## 2011-03-13 LAB — CBC WITH DIFFERENTIAL/PLATELET
Basophils Absolute: 0 10*3/uL (ref 0.0–0.1)
Eosinophils Absolute: 0.1 10*3/uL (ref 0.0–0.7)
Lymphocytes Relative: 27.5 % (ref 12.0–46.0)
Monocytes Relative: 5 % (ref 3.0–12.0)
Neutrophils Relative %: 65.5 % (ref 43.0–77.0)
Platelets: 125 10*3/uL — ABNORMAL LOW (ref 150.0–400.0)
RDW: 12.7 % (ref 11.5–14.6)

## 2011-03-13 LAB — URINALYSIS
Hgb urine dipstick: NEGATIVE
Specific Gravity, Urine: >=1.03 (ref 1.000–1.030)
Total Protein, Urine: NEGATIVE
Urine Glucose: 500
Urobilinogen, UA: 0.2 (ref 0.0–1.0)

## 2011-03-13 LAB — MICROALBUMIN / CREATININE URINE RATIO: Microalb, Ur: 0.9 mg/dL (ref 0.0–1.9)

## 2011-03-13 LAB — LIPID PANEL
Cholesterol: 162 mg/dL (ref 0–200)
HDL: 30.9 mg/dL — ABNORMAL LOW (ref >39.00)
Total CHOL/HDL Ratio: 5
VLDL: 92.6 mg/dL — ABNORMAL HIGH (ref 0.0–40.0)

## 2011-03-13 LAB — HEPATIC FUNCTION PANEL
ALT: 30 U/L (ref 0–53)
Alkaline Phosphatase: 48 U/L (ref 39–117)
Bilirubin, Direct: 0 mg/dL (ref 0.0–0.3)
Total Bilirubin: 0.5 mg/dL (ref 0.3–1.2)
Total Protein: 6.3 g/dL (ref 6.0–8.3)

## 2011-03-13 LAB — LDL CHOLESTEROL, DIRECT: Direct LDL: 80.1 mg/dL

## 2011-03-16 ENCOUNTER — Ambulatory Visit (INDEPENDENT_AMBULATORY_CARE_PROVIDER_SITE_OTHER): Payer: BC Managed Care – PPO | Admitting: Internal Medicine

## 2011-03-16 ENCOUNTER — Encounter: Payer: Self-pay | Admitting: Internal Medicine

## 2011-03-16 ENCOUNTER — Encounter: Payer: BC Managed Care – PPO | Admitting: Internal Medicine

## 2011-03-16 VITALS — BP 104/68 | HR 89 | Temp 98.5°F | Ht 69.0 in | Wt 206.0 lb

## 2011-03-16 DIAGNOSIS — Z0000 Encounter for general adult medical examination without abnormal findings: Secondary | ICD-10-CM

## 2011-03-16 DIAGNOSIS — E119 Type 2 diabetes mellitus without complications: Secondary | ICD-10-CM

## 2011-03-16 MED ORDER — INSULIN GLARGINE 100 UNIT/ML ~~LOC~~ SOLN: 25.0000 [IU] | Freq: Every day | SUBCUTANEOUS | Status: DC

## 2011-03-16 MED ORDER — TADALAFIL 20 MG PO TABS: 20.0000 mg | ORAL_TABLET | Freq: Every day | ORAL | Status: DC | PRN

## 2011-03-16 NOTE — Patient Instructions (Signed)
Increase the Lantus to 25 units per day Continue all other medications as before Please have the pharmacy call if you need further refills of any of your medications Please return in 6 mo with Lab testing done 3-5 days before

## 2011-03-16 NOTE — Assessment & Plan Note (Signed)
Uncontrolled - to increase the lantus to 25 units

## 2011-03-16 NOTE — Progress Notes (Signed)
Subjective:    Patient ID: Wayne Diaz, male    DOB: 01/17/1961, 50 y.o.   MRN: [id number]  HPI Here for wellness and f/u;  Overall doing ok;  Pt denies CP, worsening SOB, DOE, wheezing, orthopnea, PND, worsening LE edema, palpitations, dizziness or syncope.  Pt denies neurological change such as new Headache, facial or extremity weakness.  Pt denies polydipsia, polyuria, or low sugar symptoms. Pt states overall good compliance with treatment and medications, good tolerability, and trying to follow lower cholesterol diet.  Pt denies worsening depressive symptoms, suicidal ideation or panic. No fever, wt loss, night sweats, loss of appetite, or other constitutional symptoms.  Pt states good ability with ADL's, low fall risk, home safety reviewed and adequate, no significant changes in hearing or vision, and occasionally active with exercise.  No other acute complaints No past medical history on file. Past Surgical History  Procedure Date  . S/p left knee surgury     ACL repair  . Tonsillectomy     reports that he has been smoking.  He does not have any smokeless tobacco history on file. He reports that he does not drink alcohol or use illicit drugs. family history includes Arthritis in his other; Diabetes in his other; Heart disease in his other; Hyperlipidemia in his other; and Hypertension in his other. No Known Allergies Current Outpatient Prescriptions on File Prior to Visit  Medication Sig Dispense Refill  . albuterol (PROAIR HFA) 108 (90 BASE) MCG/ACT inhaler Inhale 2 puffs into the lungs every 4 (four) hours as needed. For shortness of breath       . aspirin 81 MG tablet Take 81 mg by mouth daily.        Marland Kitchen glucose blood (ONE TOUCH ULTRA TEST) test strip 1 each. Test at least three times per day       . Insulin Pen Needle (PEN NEEDLES 31GX5/16") 31G X 8 MM MISC Use as directed once daily       . Lancets MISC Use as directed three times a day       . pravastatin (PRAVACHOL) 40 MG tablet Take  40 mg by mouth daily.        . sitaGLIPtan-metformin (JANUMET) 50-1000 MG per tablet Take 1 tablet by mouth 2 (two) times daily at 10 AM and 5 PM.        . DISCONTD: insulin glargine (LANTUS SOLOSTAR) 100 UNIT/ML injection Inject into the skin. Use as directed 20 units per day       . DISCONTD: tadalafil (CIALIS) 20 MG tablet Take 20 mg by mouth daily as needed.         Review of Systems Review of Systems  Constitutional: Negative for diaphoresis, activity change, appetite change and unexpected weight change.  HENT: Negative for hearing loss, ear pain, facial swelling, mouth sores and neck stiffness.   Eyes: Negative for pain, redness and visual disturbance.  Respiratory: Negative for shortness of breath and wheezing.   Cardiovascular: Negative for chest pain and palpitations.  Gastrointestinal: Negative for diarrhea, blood in stool, abdominal distention and rectal pain.  Genitourinary: Negative for hematuria, flank pain and decreased urine volume.  Musculoskeletal: Negative for myalgias and joint swelling.  Skin: Negative for color change and wound.  Neurological: Negative for syncope and numbness.  Hematological: Negative for adenopathy.  Psychiatric/Behavioral: Negative for hallucinations, self-injury, decreased concentration and agitation.      Objective:   Physical Exam BP 104/68  Pulse 89  Temp(Src) 98.5 F (36.9 C) (  Oral)  Ht 5\' 9"  (1.753 m)  Wt 206 lb (93.441 kg)  BMI 30.42 kg/m2  SpO2 97% Physical Exam  VS noted Constitutional: Pt is oriented to person, place, and time. Appears well-developed and well-nourished.  HENT:  Head: Normocephalic and atraumatic.  Right Ear: External ear normal.  Left Ear: External ear normal.  Nose: Nose normal.  Mouth/Throat: Oropharynx is clear and moist.  Eyes: Conjunctivae and EOM are normal. Pupils are equal, round, and reactive to light.  Neck: Normal range of motion. Neck supple. No JVD present. No tracheal deviation present.    Cardiovascular: Normal rate, regular rhythm, normal heart sounds and intact distal pulses.   Pulmonary/Chest: Effort normal and breath sounds normal.  Abdominal: Soft. Bowel sounds are normal. There is no tenderness.  Musculoskeletal: Normal range of motion. Exhibits no edema.  Left knee crepitus noted Lymphadenopathy:  Has no cervical adenopathy.  Neurological: Pt is alert and oriented to person, place, and time. Pt has normal reflexes. No cranial nerve deficit.  Skin: Skin is warm and dry. No rash noted.  Psychiatric:  Has  normal mood and affect. Behavior is normal. 1+ nervous        Assessment & Plan:

## 2011-03-16 NOTE — Assessment & Plan Note (Signed)

## 2011-04-03 ENCOUNTER — Other Ambulatory Visit: Payer: Self-pay

## 2011-04-03 MED ORDER — SITAGLIPTIN PHOS-METFORMIN HCL 50-1000 MG PO TABS: 1.0000 | ORAL_TABLET | Freq: Two times a day (BID) | ORAL | Status: DC

## 2011-05-16 ENCOUNTER — Other Ambulatory Visit: Payer: Self-pay | Admitting: Internal Medicine

## 2011-06-25 ENCOUNTER — Other Ambulatory Visit: Payer: Self-pay | Admitting: *Deleted

## 2011-06-25 MED ORDER — PRAVASTATIN SODIUM 40 MG PO TABS: 80.0000 mg | ORAL_TABLET | Freq: Every day | ORAL | Status: DC

## 2011-06-25 NOTE — Telephone Encounter (Signed)
R'cd fax from [facility] for refill of Pravastatin  Last OV-03/16/2011  Last filled-03/04/2011

## 2011-08-29 ENCOUNTER — Other Ambulatory Visit: Payer: Self-pay | Admitting: Internal Medicine

## 2011-09-04 ENCOUNTER — Other Ambulatory Visit: Payer: Self-pay

## 2011-09-04 MED ORDER — PRAVASTATIN SODIUM 40 MG PO TABS: 80.0000 mg | ORAL_TABLET | Freq: Every day | ORAL | Status: DC

## 2011-09-10 ENCOUNTER — Other Ambulatory Visit (INDEPENDENT_AMBULATORY_CARE_PROVIDER_SITE_OTHER): Payer: BC Managed Care – PPO

## 2011-09-10 DIAGNOSIS — E119 Type 2 diabetes mellitus without complications: Secondary | ICD-10-CM

## 2011-09-10 LAB — LIPID PANEL
Cholesterol: 160 mg/dL (ref 0–200)
HDL: 37.1 mg/dL — ABNORMAL LOW (ref >39.00)
LDL Cholesterol: 84 mg/dL (ref 0–99)
Total CHOL/HDL Ratio: 4
Triglycerides: 197 mg/dL — ABNORMAL HIGH (ref 0.0–149.0)

## 2011-09-10 LAB — BASIC METABOLIC PANEL
BUN: 22 mg/dL (ref 6–23)
CO2: 25 mEq/L (ref 19–32)
Calcium: 8.8 mg/dL (ref 8.4–10.5)
Creatinine, Ser: 0.9 mg/dL (ref 0.4–1.5)

## 2011-09-14 ENCOUNTER — Ambulatory Visit (INDEPENDENT_AMBULATORY_CARE_PROVIDER_SITE_OTHER): Payer: BC Managed Care – PPO | Admitting: Internal Medicine

## 2011-09-14 ENCOUNTER — Encounter: Payer: Self-pay | Admitting: Internal Medicine

## 2011-09-14 VITALS — BP 110/70 | HR 87 | Temp 99.0°F | Ht 69.0 in | Wt 216.4 lb

## 2011-09-14 DIAGNOSIS — E785 Hyperlipidemia, unspecified: Secondary | ICD-10-CM

## 2011-09-14 DIAGNOSIS — Z0000 Encounter for general adult medical examination without abnormal findings: Secondary | ICD-10-CM

## 2011-09-14 DIAGNOSIS — E119 Type 2 diabetes mellitus without complications: Secondary | ICD-10-CM

## 2011-09-14 DIAGNOSIS — J309 Allergic rhinitis, unspecified: Secondary | ICD-10-CM

## 2011-09-14 DIAGNOSIS — J45909 Unspecified asthma, uncomplicated: Secondary | ICD-10-CM

## 2011-09-14 MED ORDER — SITAGLIPTIN PHOS-METFORMIN HCL 50-1000 MG PO TABS: 1.0000 | ORAL_TABLET | Freq: Two times a day (BID) | ORAL | Status: DC

## 2011-09-14 MED ORDER — TADALAFIL 20 MG PO TABS: 20.0000 mg | ORAL_TABLET | Freq: Every day | ORAL | Status: DC | PRN

## 2011-09-14 MED ORDER — LANCETS MISC: 1.0000 "application " | Freq: Every day | Status: DC

## 2011-09-14 MED ORDER — PRAVASTATIN SODIUM 40 MG PO TABS: 80.0000 mg | ORAL_TABLET | Freq: Every day | ORAL | Status: DC

## 2011-09-14 MED ORDER — "PEN NEEDLES 5/16"" 31G X 8 MM MISC": 1.0000 "application " | Freq: Every day | Status: DC

## 2011-09-14 MED ORDER — INSULIN GLARGINE 100 UNIT/ML ~~LOC~~ SOLN: 35.0000 [IU] | Freq: Every day | SUBCUTANEOUS | Status: DC

## 2011-09-14 MED ORDER — GLUCOSE BLOOD VI STRP: ORAL_STRIP | Status: DC

## 2011-09-14 NOTE — Assessment & Plan Note (Signed)
stable overall by hx and exam, most recent data reviewed with pt, and pt to continue medical treatment as before  SpO2 Readings from Last 3 Encounters:  09/14/11 95%  03/16/11 97%  09/15/10 96%

## 2011-09-14 NOTE — Assessment & Plan Note (Signed)
stable overall by hx and exam, most recent data reviewed with pt, and pt to continue medical treatment as before Lab Results  Component Value Date   LDLCALC 84 09/10/2011    

## 2011-09-14 NOTE — Patient Instructions (Signed)
Please increase the lantus to 35 units per day Please increase the lantus by 3 units every 3 days until your sugars (especially the sugars in the morning) get to 90-110 range Continue all other medications as before All of your prescriptions were sent to the pharmacy as requested Please return in 6 mo with Lab testing done 3-5 days before

## 2011-09-14 NOTE — Assessment & Plan Note (Signed)
stable overall by hx and exam, , and pt to continue medical treatment as before, can use OTC allegra prn

## 2011-09-14 NOTE — Assessment & Plan Note (Signed)
Mild unconntroleld, to incr the lantus to 35 units, and cont titrate as needed to achieve at lesat controlled sugars in the AM, to call for any low or highs,  to f/u any worsening symptoms or concerns, to f/u labs in the am  Lab Results  Component Value Date   HGBA1C 8.0* 09/10/2011

## 2011-09-14 NOTE — Progress Notes (Signed)
  Subjective:    Patient ID: Wayne Diaz, male    DOB: 01-Aug-1961, 50 y.o.   MRN: [id number]  HPI Here to f/u; overall doing ok,  Pt denies chest pain, increased sob or doe, wheezing, orthopnea, PND, increased LE swelling, palpitations, dizziness or syncope.  Pt denies new neurological symptoms such as new headache, or facial or extremity weakness or numbness   Pt denies polydipsia, polyuria, or low sugar symptoms such as weakness or confusion improved with po intake.  Pt states overall good compliance with meds, trying to follow lower cholesterol, diabetic diet, wt overall stable but little exercise however.  CBG' in the AM still about 170 though he has increased the lantus to now 35 units per day. States has not had to use the proventil Inh for almost a yr, so does not need refill.  Needs all other meds.  No other new complaints or concerns.  Currently denies ongoing nasal allergy symptoms with clear congestion, itch and sneeze, and no fever, pain, ST, cough or wheezing.  Past Medical History  Diagnosis Date  . HYPERLIPIDEMIA 05/05/2008  . GERD 05/05/2008  . DIABETES MELLITUS, TYPE II 04/28/2008  . ASTHMA 05/05/2008  . ALLERGIC RHINITIS 05/05/2008  . SMOKER 03/10/2009  . HYPERSOMNIA 03/10/2009   Past Surgical History  Procedure Date  . S/p left knee surgury     ACL repair  . Tonsillectomy     reports that he has been smoking.  He does not have any smokeless tobacco history on file. He reports that he does not drink alcohol or use illicit drugs. family history includes Arthritis in his other; Diabetes in his other; Heart disease in his other; Hyperlipidemia in his other; and Hypertension in his other. No Known Allergies Current Outpatient Prescriptions on File Prior to Visit  Medication Sig Dispense Refill  . aspirin 81 MG tablet Take 81 mg by mouth daily.          Review of Systems Review of Systems  Constitutional: Negative for diaphoresis and unexpected weight change.  HENT: Negative for drooling  and tinnitus.   Eyes: Negative for photophobia and visual disturbance.  Respiratory: Negative for choking and stridor.   Gastrointestinal: Negative for vomiting and blood in stool.  Genitourinary: Negative for hematuria and decreased urine volume.     Objective:   Physical Exam BP 110/70  Pulse 87  Temp(Src) 99 F (37.2 C) (Oral)  Ht 5\' 9"  (1.753 m)  Wt 216 lb 6 oz (98.147 kg)  BMI 31.95 kg/m2  SpO2 95% Physical Exam  VS noted Constitutional: Pt appears well-developed and well-nourished.  HENT: Head: Normocephalic.  Right Ear: External ear normal.  Left Ear: External ear normal.  Eyes: Conjunctivae and EOM are normal. Pupils are equal, round, and reactive to light.  Neck: Normal range of motion. Neck supple.  Cardiovascular: Normal rate and regular rhythm.   Pulmonary/Chest: Effort normal and breath sounds normal.  Neurological: Pt is alert. No cranial nerve deficit.  Skin: Skin is warm. No erythema.  Psychiatric: Pt behavior is normal. Thought content normal.     Assessment & Plan:

## 2012-01-27 ENCOUNTER — Other Ambulatory Visit: Payer: Self-pay | Admitting: Internal Medicine

## 2012-03-04 ENCOUNTER — Other Ambulatory Visit (INDEPENDENT_AMBULATORY_CARE_PROVIDER_SITE_OTHER): Payer: BC Managed Care – PPO

## 2012-03-04 DIAGNOSIS — E119 Type 2 diabetes mellitus without complications: Secondary | ICD-10-CM

## 2012-03-04 DIAGNOSIS — Z0000 Encounter for general adult medical examination without abnormal findings: Secondary | ICD-10-CM

## 2012-03-04 DIAGNOSIS — E781 Pure hyperglyceridemia: Secondary | ICD-10-CM

## 2012-03-04 LAB — BASIC METABOLIC PANEL
Calcium: 9.1 mg/dL (ref 8.4–10.5)
GFR: 99.26 mL/min (ref >60.00)
Glucose, Bld: 117 mg/dL — ABNORMAL HIGH (ref 70–99)
Potassium: 4.4 mEq/L (ref 3.5–5.1)
Sodium: 140 mEq/L (ref 135–145)

## 2012-03-04 LAB — CBC WITH DIFFERENTIAL/PLATELET
Basophils Relative: 0.7 % (ref 0.0–3.0)
Eosinophils Relative: 2 % (ref 0.0–5.0)
MCV: 89.5 fl (ref 78.0–100.0)
Monocytes Absolute: 0.5 10*3/uL (ref 0.1–1.0)
Neutrophils Relative %: 63.1 % (ref 43.0–77.0)
RBC: 5.23 Mil/uL (ref 4.22–5.81)
WBC: 8 10*3/uL (ref 4.5–10.5)

## 2012-03-04 LAB — HEPATIC FUNCTION PANEL
AST: 20 U/L (ref 0–37)
Alkaline Phosphatase: 50 U/L (ref 39–117)
Bilirubin, Direct: 0.1 mg/dL (ref 0.0–0.3)
Total Bilirubin: 0.7 mg/dL (ref 0.3–1.2)

## 2012-03-04 LAB — LIPID PANEL
HDL: 34.1 mg/dL — ABNORMAL LOW (ref >39.00)
Total CHOL/HDL Ratio: 5
VLDL: 71.8 mg/dL — ABNORMAL HIGH (ref 0.0–40.0)

## 2012-03-04 LAB — PSA: PSA: 0.29 ng/mL (ref 0.10–4.00)

## 2012-03-04 LAB — MICROALBUMIN / CREATININE URINE RATIO: Microalb Creat Ratio: 0.3 mg/g (ref 0.0–30.0)

## 2012-03-04 LAB — URINALYSIS, ROUTINE W REFLEX MICROSCOPIC
Nitrite: NEGATIVE
Specific Gravity, Urine: >=1.03 (ref 1.000–1.030)
Total Protein, Urine: NEGATIVE
pH: 6 (ref 5.0–8.0)

## 2012-03-04 LAB — LDL CHOLESTEROL, DIRECT: Direct LDL: 75.8 mg/dL

## 2012-03-07 ENCOUNTER — Ambulatory Visit (INDEPENDENT_AMBULATORY_CARE_PROVIDER_SITE_OTHER): Payer: BC Managed Care – PPO | Admitting: Internal Medicine

## 2012-03-07 ENCOUNTER — Encounter: Payer: Self-pay | Admitting: Internal Medicine

## 2012-03-07 VITALS — BP 104/60 | HR 82 | Temp 97.7°F | Ht 68.0 in | Wt 210.0 lb

## 2012-03-07 DIAGNOSIS — E119 Type 2 diabetes mellitus without complications: Secondary | ICD-10-CM

## 2012-03-07 DIAGNOSIS — M109 Gout, unspecified: Secondary | ICD-10-CM

## 2012-03-07 DIAGNOSIS — Z0000 Encounter for general adult medical examination without abnormal findings: Secondary | ICD-10-CM

## 2012-03-07 MED ORDER — INDOMETHACIN 50 MG PO CAPS: 50.0000 mg | ORAL_CAPSULE | Freq: Three times a day (TID) | ORAL | Status: AC | PRN

## 2012-03-07 NOTE — Progress Notes (Signed)
Subjective:    Patient ID: Wayne Diaz, male    DOB: September 12, 1961, 51 y.o.   MRN: [id number]  HPI  Here for wellness and f/u;  Overall doing ok;  Pt denies CP, worsening SOB, DOE, wheezing, orthopnea, PND, worsening LE edema, palpitations, dizziness or syncope.  Pt denies neurological change such as new Headache, facial or extremity weakness.  Pt denies polydipsia, polyuria, or low sugar symptoms. Pt states overall good compliance with treatment and medications, good tolerability, and trying to follow lower cholesterol diet.  Pt denies worsening depressive symptoms, suicidal ideation or panic. No fever, wt loss, night sweats, loss of appetite, or other constitutional symptoms.  Pt states good ability with ADL's, low fall risk, home safety reviewed and adequate, no significant changes in hearing or vision, and occasionally active with exercise.  CBG's have been 130 and 108, had one episode of low sugar with taking the am insulin and not eating right away - down to 79m felt poorly, better with food. Did have episode or right hand swelling, pain severe to wake up now resolved after nsaid. Past Medical History  Diagnosis Date  . HYPERLIPIDEMIA 05/05/2008  . GERD 05/05/2008  . DIABETES MELLITUS, TYPE II 04/28/2008  . ASTHMA 05/05/2008  . ALLERGIC RHINITIS 05/05/2008  . SMOKER 03/10/2009  . HYPERSOMNIA 03/10/2009   Past Surgical History  Procedure Date  . S/p left knee surgury     ACL repair  . Tonsillectomy     reports that he has been smoking.  He does not have any smokeless tobacco history on file. He reports that he does not drink alcohol or use illicit drugs. family history includes Arthritis in his other; Diabetes in his other; Heart disease in his other; Hyperlipidemia in his other; and Hypertension in his other. No Known Allergies Current Outpatient Prescriptions on File Prior to Visit  Medication Sig Dispense Refill  . aspirin 81 MG tablet Take 81 mg by mouth daily.        Marland Kitchen glucose blood (ONE TOUCH  ULTRA TEST) test strip Test at least three times per day  100 each  3  . Insulin Pen Needle (PEN NEEDLES 31GX5/16") 31G X 8 MM MISC 1 application by Does not apply route daily. Use as directed once daily  100 each  3  . Lancets MISC 1 application by Does not apply route daily. Use as directed three times a day  100 each  3  . LANTUS SOLOSTAR 100 UNIT/ML injection INJECT 35 UNITS INTO THE SKIN DAILY.  3 mL  2  . pravastatin (PRAVACHOL) 40 MG tablet Take 2 tablets (80 mg total) by mouth daily.  180 tablet  3  . sitaGLIPtan-metformin (JANUMET) 50-1000 MG per tablet Take 1 tablet by mouth 2 (two) times daily at 10 AM and 5 PM.  180 tablet  3  . tadalafil (CIALIS) 20 MG tablet Take 1 tablet (20 mg total) by mouth daily as needed.  10 tablet  5   Review of Systems Review of Systems  Constitutional: Negative for diaphoresis, activity change, appetite change and unexpected weight change.  HENT: Negative for hearing loss, ear pain, facial swelling, mouth sores and neck stiffness.   Eyes: Negative for pain, redness and visual disturbance.  Respiratory: Negative for shortness of breath and wheezing.   Cardiovascular: Negative for chest pain and palpitations.  Gastrointestinal: Negative for diarrhea, blood in stool, abdominal distention and rectal pain.  Genitourinary: Negative for hematuria, flank pain and decreased urine volume.  Musculoskeletal: Negative  for myalgias and joint swelling.  Skin: Negative for color change and wound.  Neurological: Negative for syncope and numbness.  Hematological: Negative for adenopathy.  Psychiatric/Behavioral: Negative for hallucinations, self-injury, decreased concentration and agitation.      Objective:   Physical Exam BP 104/60  Pulse 82  Temp(Src) 97.7 F (36.5 C) (Oral)  Ht 5\' 8"  (1.727 m)  Wt 210 lb (95.255 kg)  BMI 31.93 kg/m2  SpO2 96% Physical Exam  VS noted Constitutional: Pt is oriented to person, place, and time. Appears well-developed and  well-nourished.  HENT:  Head: Normocephalic and atraumatic.  Right Ear: External ear normal.  Left Ear: External ear normal.  Nose: Nose normal.  Mouth/Throat: Oropharynx is clear and moist.  Eyes: Conjunctivae and EOM are normal. Pupils are equal, round, and reactive to light.  Neck: Normal range of motion. Neck supple. No JVD present. No tracheal deviation present.  Cardiovascular: Normal rate, regular rhythm, normal heart sounds and intact distal pulses.   Pulmonary/Chest: Effort normal and breath sounds normal.  Abdominal: Soft. Bowel sounds are normal. There is no tenderness.  Musculoskeletal: Normal range of motion. Exhibits no edema. and no right hand/wrist swelling/tender Lymphadenopathy:  Has no cervical adenopathy.  Neurological: Pt is alert and oriented to person, place, and time. Pt has normal reflexes. No cranial nerve deficit.  Skin: Skin is warm and dry. No rash noted.  Psychiatric:  Has  normal mood and affect. Behavior is normal.     Assessment & Plan:

## 2012-03-07 NOTE — Assessment & Plan Note (Signed)

## 2012-03-07 NOTE — Assessment & Plan Note (Signed)
Presumed with typical hx - to d/c mobic, for indocin prn, check uric acid next visit,  to f/u any worsening symptoms or concerns

## 2012-03-07 NOTE — Patient Instructions (Signed)
Take all new medications as prescribed Continue all other medications as before, except ok to stop the meloxicam You are otherwise up to date with prevention Please return in 6 mo with Lab testing done 3-5 days before

## 2012-03-09 ENCOUNTER — Encounter: Payer: Self-pay | Admitting: Internal Medicine

## 2012-03-09 NOTE — Assessment & Plan Note (Signed)
stable overall by hx and exam, most recent data reviewed with pt, and pt to continue medical treatment as before  Lab Results  Component Value Date   HGBA1C 7.2* 03/04/2012

## 2012-05-11 ENCOUNTER — Other Ambulatory Visit: Payer: Self-pay | Admitting: Internal Medicine

## 2012-06-09 ENCOUNTER — Other Ambulatory Visit: Payer: Self-pay | Admitting: Internal Medicine

## 2012-06-27 ENCOUNTER — Other Ambulatory Visit: Payer: Self-pay | Admitting: Internal Medicine

## 2012-07-18 ENCOUNTER — Other Ambulatory Visit: Payer: Self-pay | Admitting: Internal Medicine

## 2012-09-11 ENCOUNTER — Other Ambulatory Visit (INDEPENDENT_AMBULATORY_CARE_PROVIDER_SITE_OTHER): Payer: BC Managed Care – PPO

## 2012-09-11 DIAGNOSIS — M109 Gout, unspecified: Secondary | ICD-10-CM

## 2012-09-11 DIAGNOSIS — E785 Hyperlipidemia, unspecified: Secondary | ICD-10-CM

## 2012-09-11 DIAGNOSIS — E119 Type 2 diabetes mellitus without complications: Secondary | ICD-10-CM

## 2012-09-11 LAB — HEMOGLOBIN A1C: Hgb A1c MFr Bld: 7.6 % — ABNORMAL HIGH (ref 4.6–6.5)

## 2012-09-11 LAB — BASIC METABOLIC PANEL
BUN: 23 mg/dL (ref 6–23)
Chloride: 104 mEq/L (ref 96–112)
GFR: 76.16 mL/min (ref >60.00)
Potassium: 5.1 mEq/L (ref 3.5–5.1)
Sodium: 139 mEq/L (ref 135–145)

## 2012-09-11 LAB — LDL CHOLESTEROL, DIRECT: Direct LDL: 87.5 mg/dL

## 2012-09-11 LAB — LIPID PANEL: VLDL: 60.8 mg/dL — ABNORMAL HIGH (ref 0.0–40.0)

## 2012-09-11 LAB — URIC ACID: Uric Acid, Serum: 4 mg/dL (ref 4.0–7.8)

## 2012-09-12 ENCOUNTER — Ambulatory Visit (INDEPENDENT_AMBULATORY_CARE_PROVIDER_SITE_OTHER): Payer: BC Managed Care – PPO | Admitting: Internal Medicine

## 2012-09-12 ENCOUNTER — Encounter: Payer: Self-pay | Admitting: Internal Medicine

## 2012-09-12 VITALS — BP 110/72 | HR 68 | Temp 97.4°F | Ht 69.0 in | Wt 205.0 lb

## 2012-09-12 DIAGNOSIS — Z0000 Encounter for general adult medical examination without abnormal findings: Secondary | ICD-10-CM

## 2012-09-12 DIAGNOSIS — E785 Hyperlipidemia, unspecified: Secondary | ICD-10-CM

## 2012-09-12 DIAGNOSIS — J45909 Unspecified asthma, uncomplicated: Secondary | ICD-10-CM

## 2012-09-12 DIAGNOSIS — E119 Type 2 diabetes mellitus without complications: Secondary | ICD-10-CM

## 2012-09-12 NOTE — Patient Instructions (Addendum)
Please continue your efforts at being more active, low cholesterol diet, and weight control., and please take all of your medications as prescribed Continue all other medications as before Please have the pharmacy call with any refills you may need. Please return in 6 mo with Lab testing done 3-5 days before

## 2012-09-13 ENCOUNTER — Encounter: Payer: Self-pay | Admitting: Internal Medicine

## 2012-09-13 NOTE — Assessment & Plan Note (Signed)
stable overall by hx and exam, most recent data reviewed with pt, and pt to continue medical treatment as before Lab Results  Component Value Date   HGBA1C 7.6* 09/11/2012   To work on better diet,e xcercise, wt loss, cont wt loss efforts

## 2012-09-13 NOTE — Assessment & Plan Note (Signed)
stable overall by hx and exam, most recent data reviewed with pt, and pt to continue medical treatment as before SpO2 Readings from Last 3 Encounters:  09/12/12 97%  03/07/12 96%  09/14/11 95%

## 2012-09-13 NOTE — Progress Notes (Signed)
Subjective:    Patient ID: Wayne Diaz, male    DOB: 1961-03-13, 51 y.o.   MRN: [id number]  HPI Here to f/u; overall doing ok,  Pt denies chest pain, increased sob or doe, wheezing, orthopnea, PND, increased LE swelling, palpitations, dizziness or syncope.  Pt denies new neurological symptoms such as new headache, or facial or extremity weakness or numbness   Pt denies polydipsia, polyuria, or low sugar symptoms such as weakness or confusion improved with po intake.  Pt states overall good compliance with meds, trying to follow lower cholesterol, diabetic diet, wt overall stable but little exercise however.  Has been working 12 hr days, actually essentially no exercise, gained several lbs.   Past Medical History  Diagnosis Date  . HYPERLIPIDEMIA 05/05/2008  . GERD 05/05/2008  . DIABETES MELLITUS, TYPE II 04/28/2008  . ASTHMA 05/05/2008  . ALLERGIC RHINITIS 05/05/2008  . SMOKER 03/10/2009  . HYPERSOMNIA 03/10/2009   Past Surgical History  Procedure Date  . S/p left knee surgury     ACL repair  . Tonsillectomy     reports that he has been smoking.  He does not have any smokeless tobacco history on file. He reports that he does not drink alcohol or use illicit drugs. family history includes Arthritis in his other; Diabetes in his other; Heart disease in his other; Hyperlipidemia in his other; and Hypertension in his other. No Known Allergies Current Outpatient Prescriptions on File Prior to Visit  Medication Sig Dispense Refill  . CIALIS 20 MG tablet TAKE 1 TABLET BY MOUTH ONCE DAILY AS NEEDED  5 each  1  . glucose blood (ONE TOUCH ULTRA TEST) test strip Test at least three times per day  100 each  3  . insulin glargine (LANTUS SOLOSTAR) 100 UNIT/ML injection       . Insulin Pen Needle (PEN NEEDLES 31GX5/16") 31G X 8 MM MISC 1 application by Does not apply route daily. Use as directed once daily  100 each  3  . JANUMET 50-1000 MG per tablet TAKE 1 TABLET BY MOUTH 2 TIMES DAILY AT 10 AM AND 5 PM  180  tablet  3  . Lancets MISC 1 application by Does not apply route daily. Use as directed three times a day  100 each  3  . pravastatin (PRAVACHOL) 40 MG tablet TAKE 2 TABLETS (80 MG TOTAL) BY MOUTH DAILY.  60 tablet  8  . sitaGLIPtan-metformin (JANUMET) 50-1000 MG per tablet Take 1 tablet by mouth 2 (two) times daily at 10 AM and 5 PM.  180 tablet  3  . aspirin 81 MG tablet Take 81 mg by mouth daily.         Review of Systems  Constitutional: Negative for diaphoresis and unexpected weight change.  HENT: Negative for tinnitus.   Eyes: Negative for photophobia and visual disturbance.  Respiratory: Negative for choking and stridor.   Gastrointestinal: Negative for vomiting and blood in stool.  Genitourinary: Negative for hematuria and decreased urine volume.  Musculoskeletal: Negative for gait problem.  Skin: Negative for color change and wound.  Neurological: Negative for tremors and numbness.  Psychiatric/Behavioral: Negative for decreased concentration. The patient is not hyperactive.       Objective:   Physical Exam BP 110/72  Pulse 68  Temp 97.4 F (36.3 C) (Oral)  Ht 5\' 9"  (1.753 m)  Wt 205 lb (92.987 kg)  BMI 30.27 kg/m2  SpO2 97% Physical Exam  VS noted Constitutional: Pt appears well-developed and well-nourished.  HENT: Head: Normocephalic.  Right Ear: External ear normal.  Left Ear: External ear normal.  Eyes: Conjunctivae and EOM are normal. Pupils are equal, round, and reactive to light.  Neck: Normal range of motion. Neck supple.  Cardiovascular: Normal rate and regular rhythm.   Pulmonary/Chest: Effort normal and breath sounds normal.  Abd:  Soft, NT, non-distended, + BS Neurological: Pt is alert. Not confused  Skin: Skin is warm. No erythema.  Psychiatric: Pt behavior is normal. Thought content normal.     Assessment & Plan:

## 2012-09-13 NOTE — Assessment & Plan Note (Signed)
stable overall by hx and exam, most recent data reviewed with pt, and pt to continue medical treatment as before Lab Results  Component Value Date   LDLCALC 84 09/10/2011    

## 2012-10-14 ENCOUNTER — Encounter: Payer: Self-pay | Admitting: Internal Medicine

## 2012-10-14 ENCOUNTER — Ambulatory Visit (INDEPENDENT_AMBULATORY_CARE_PROVIDER_SITE_OTHER): Payer: BC Managed Care – PPO | Admitting: Internal Medicine

## 2012-10-14 VITALS — BP 108/62 | HR 90 | Temp 98.1°F | Ht 68.0 in | Wt 207.0 lb

## 2012-10-14 DIAGNOSIS — E785 Hyperlipidemia, unspecified: Secondary | ICD-10-CM

## 2012-10-14 DIAGNOSIS — M7020 Olecranon bursitis, unspecified elbow: Secondary | ICD-10-CM

## 2012-10-14 DIAGNOSIS — E119 Type 2 diabetes mellitus without complications: Secondary | ICD-10-CM

## 2012-10-14 DIAGNOSIS — M7021 Olecranon bursitis, right elbow: Secondary | ICD-10-CM | POA: Insufficient documentation

## 2012-10-14 NOTE — Assessment & Plan Note (Signed)
stable overall by hx and exam, most recent data reviewed with pt, and pt to continue medical treatment as before Lab Results  Component Value Date   LDLCALC 84 09/10/2011

## 2012-10-14 NOTE — Patient Instructions (Addendum)
Continue all other medications as before You can also take advil prn, and use warm compresses for discomfort Please consider simple protection such as elbow pad for padding until healed Thank you for enrolling in MyChart. Please follow the instructions below to securely access your online medical record. MyChart allows you to send messages to your doctor, view your test results, renew your prescriptions, schedule appointments, and more. To Log into MyChart, please go to [email or web address], and your Username is: [email or web address]

## 2012-10-14 NOTE — Progress Notes (Signed)
Subjective:    Patient ID: Wayne Diaz, male    DOB: 03/08/1961, 51 y.o.   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  Pt denies chest pain, increased sob or doe, wheezing, orthopnea, PND, increased LE swelling, palpitations, dizziness or syncope.   Pt denies polydipsia, polyuria. Pt denies new neurological symptoms such as new headache, or facial or extremity weakness or numbness.   Pt denies fever, wt loss, night sweats, loss of appetite, or other constitutional symptoms.  No trauma, fever, hx of gout Past Medical History  Diagnosis Date  . HYPERLIPIDEMIA 05/05/2008  . GERD 05/05/2008  . DIABETES MELLITUS, TYPE II 04/28/2008  . ASTHMA 05/05/2008  . ALLERGIC RHINITIS 05/05/2008  . SMOKER 03/10/2009  . HYPERSOMNIA 03/10/2009   Past Surgical History  Procedure Date  . S/p left knee surgury     ACL repair  . Tonsillectomy     reports that he has been smoking.  He does not have any smokeless tobacco history on file. He reports that he does not drink alcohol or use illicit drugs. family history includes Arthritis in his other; Diabetes in his other; Heart disease in his other; Hyperlipidemia in his other; and Hypertension in his other. No Known Allergies Current Outpatient Prescriptions on File Prior to Visit  Medication Sig Dispense Refill  . aspirin 81 MG tablet Take 81 mg by mouth daily.        Marland Kitchen CIALIS 20 MG tablet TAKE 1 TABLET BY MOUTH ONCE DAILY AS NEEDED  5 each  1  . glucose blood (ONE TOUCH ULTRA TEST) test strip Test at least three times per day  100 each  3  . insulin glargine (LANTUS SOLOSTAR) 100 UNIT/ML injection       . Insulin Pen Needle (PEN NEEDLES 31GX5/16") 31G X 8 MM MISC 1 application by Does not apply route daily. Use as directed once daily  100 each  3  . JANUMET 50-1000 MG per tablet TAKE 1 TABLET BY MOUTH 2 TIMES DAILY AT 10 AM AND 5 PM  180 tablet  3  . Lancets MISC 1  application by Does not apply route daily. Use as directed three times a day  100 each  3  . pravastatin (PRAVACHOL) 40 MG tablet TAKE 2 TABLETS (80 MG TOTAL) BY MOUTH DAILY.  60 tablet  8  . sitaGLIPtan-metformin (JANUMET) 50-1000 MG per tablet Take 1 tablet by mouth 2 (two) times daily at 10 AM and 5 PM.  180 tablet  3    Review of Systems  Constitutional: Negative for diaphoresis and unexpected weight change.  HENT: Negative for tinnitus.   Eyes: Negative for photophobia and visual disturbance.  Respiratory: Negative for choking and stridor.   Gastrointestinal: Negative for vomiting and blood in stool.  Genitourinary: Negative for hematuria and decreased urine volume.  Musculoskeletal: Negative for gait problem.  Skin: Negative for color change and wound.  Neurological: Negative for tremors and numbness.  Psychiatric/Behavioral: Negative for decreased concentration. The patient is not hyperactive.       Objective:   Physical Exam BP 108/62  Pulse 90  Temp 98.1 F (36.7 C) (Oral)  Ht 5\' 8"  (1.727 m)  Wt 207 lb (93.895 kg)  BMI 31.47 kg/m2  SpO2 95% Physical Exam  VS noted Constitutional: Pt appears well-developed and well-nourished.  HENT: Head: Normocephalic.  Right Ear: External ear normal.  Left Ear: External ear normal.  Eyes: Conjunctivae and EOM are normal. Pupils are equal, round, and reactive to light.  Neck: Normal range of motion. Neck supple.  Cardiovascular: Normal rate and regular rhythm.   Pulmonary/Chest: Effort normal and breath sounds normal.  Neurological: Pt is alert. Not confused  Skin: Skin is warm. No erythema.  Psychiatric: Pt behavior is normal. Thought content normal.  Right elbow with 1+ nontender bursa type swelling, with elbow FROM, no erythema, ulcer    Assessment & Plan:

## 2012-10-14 NOTE — Assessment & Plan Note (Signed)
Mild swelling, nontender, no erythema - d/w pt, does not need specific tx such as drainage today, for nsaid prn,  to f/u any worsening symptoms or concerns

## 2012-10-14 NOTE — Assessment & Plan Note (Signed)
stable overall by hx and exam, most recent data reviewed with pt, and pt to continue medical treatment as before Lab Results  Component Value Date   HGBA1C 7.6* 09/11/2012

## 2012-10-15 ENCOUNTER — Telehealth: Payer: Self-pay

## 2012-10-15 NOTE — Telephone Encounter (Signed)
Informed of MD instructions.

## 2012-10-15 NOTE — Telephone Encounter (Signed)
Message copied by Pincus Sanes on Wed Oct 15, 2012  9:33 AM ------      Message from: Corwin Levins      Created: Tue Oct 14, 2012 12:20 PM       I decline as explained at his OV that this is not medically necessary and will resolve on its own      ----- Message -----         From: Vladimir Crofts Ewing         Sent: 10/14/2012   9:08 AM           To: Corwin Levins, MD            Please refer the patient today if possible to have elbow drained per pt. Request or he stated he would do it himself! ER??

## 2012-12-05 ENCOUNTER — Other Ambulatory Visit: Payer: Self-pay | Admitting: Internal Medicine

## 2012-12-20 ENCOUNTER — Other Ambulatory Visit: Payer: Self-pay

## 2013-03-09 ENCOUNTER — Ambulatory Visit (INDEPENDENT_AMBULATORY_CARE_PROVIDER_SITE_OTHER): Payer: BC Managed Care – PPO

## 2013-03-09 DIAGNOSIS — Z0000 Encounter for general adult medical examination without abnormal findings: Secondary | ICD-10-CM

## 2013-03-09 DIAGNOSIS — E119 Type 2 diabetes mellitus without complications: Secondary | ICD-10-CM

## 2013-03-09 LAB — CBC WITH DIFFERENTIAL/PLATELET
Basophils Absolute: 0 10*3/uL (ref 0.0–0.1)
Eosinophils Relative: 1.6 % (ref 0.0–5.0)
HCT: 46.9 % (ref 39.0–52.0)
Lymphs Abs: 2.6 10*3/uL (ref 0.7–4.0)
MCV: 89.4 fl (ref 78.0–100.0)
Monocytes Absolute: 0.5 10*3/uL (ref 0.1–1.0)
Neutro Abs: 6.6 10*3/uL (ref 1.4–7.7)
Platelets: 125 10*3/uL — ABNORMAL LOW (ref 150.0–400.0)
RDW: 13.1 % (ref 11.5–14.6)

## 2013-03-09 LAB — MICROALBUMIN / CREATININE URINE RATIO: Microalb, Ur: 1 mg/dL (ref 0.0–1.9)

## 2013-03-09 LAB — URINALYSIS, ROUTINE W REFLEX MICROSCOPIC
Bilirubin Urine: NEGATIVE
Ketones, ur: NEGATIVE
Leukocytes, UA: NEGATIVE
Urine Glucose: NEGATIVE
pH: 5.5 (ref 5.0–8.0)

## 2013-03-10 LAB — TSH: TSH: 1.72 u[IU]/mL (ref 0.35–5.50)

## 2013-03-10 LAB — BASIC METABOLIC PANEL
GFR: 90.33 mL/min (ref >60.00)
Potassium: 4.3 mEq/L (ref 3.5–5.1)
Sodium: 138 mEq/L (ref 135–145)

## 2013-03-10 LAB — LIPID PANEL
Cholesterol: 142 mg/dL (ref 0–200)
HDL: 28.7 mg/dL — ABNORMAL LOW (ref >39.00)
Total CHOL/HDL Ratio: 5
VLDL: 42.2 mg/dL — ABNORMAL HIGH (ref 0.0–40.0)

## 2013-03-10 LAB — PSA: PSA: 0.24 ng/mL (ref 0.10–4.00)

## 2013-03-10 LAB — HEPATIC FUNCTION PANEL
AST: 20 U/L (ref 0–37)
Alkaline Phosphatase: 51 U/L (ref 39–117)
Total Bilirubin: 0.6 mg/dL (ref 0.3–1.2)

## 2013-03-13 ENCOUNTER — Ambulatory Visit (INDEPENDENT_AMBULATORY_CARE_PROVIDER_SITE_OTHER): Payer: BC Managed Care – PPO | Admitting: Internal Medicine

## 2013-03-13 ENCOUNTER — Encounter: Payer: Self-pay | Admitting: Internal Medicine

## 2013-03-13 VITALS — BP 110/62 | HR 93 | Temp 97.4°F | Ht 69.0 in | Wt 204.0 lb

## 2013-03-13 DIAGNOSIS — IMO0001 Reserved for inherently not codable concepts without codable children: Secondary | ICD-10-CM

## 2013-03-13 DIAGNOSIS — Z0000 Encounter for general adult medical examination without abnormal findings: Secondary | ICD-10-CM

## 2013-03-13 DIAGNOSIS — F17200 Nicotine dependence, unspecified, uncomplicated: Secondary | ICD-10-CM

## 2013-03-13 MED ORDER — INSULIN GLARGINE 100 UNIT/ML ~~LOC~~ SOLN: 40.0000 [IU] | Freq: Every day | SUBCUTANEOUS | Status: DC

## 2013-03-13 MED ORDER — VARENICLINE TARTRATE 0.5 MG X 11 & 1 MG X 42 PO MISC: ORAL | Status: DC

## 2013-03-13 MED ORDER — PRAVASTATIN SODIUM 40 MG PO TABS: 40.0000 mg | ORAL_TABLET | Freq: Two times a day (BID) | ORAL | Status: DC

## 2013-03-13 MED ORDER — VARENICLINE TARTRATE 1 MG PO TABS: 1.0000 mg | ORAL_TABLET | Freq: Two times a day (BID) | ORAL | Status: DC

## 2013-03-13 MED ORDER — SITAGLIPTIN PHOS-METFORMIN HCL 50-1000 MG PO TABS: 1.0000 | ORAL_TABLET | Freq: Two times a day (BID) | ORAL | Status: DC

## 2013-03-13 MED ORDER — GLUCOSE BLOOD VI STRP: ORAL_STRIP | Status: DC

## 2013-03-13 NOTE — Patient Instructions (Addendum)
Please take all new medication as prescribed - the chantix Please continue all other medications as before, and refills have been done if requested. Please have the pharmacy call with any other refills you may need. Please continue your efforts at being more active, low cholesterol diet, and weight control. You are otherwise up to date with prevention measures today. Please remember to sign up for My Chart if you have not done so, as this will be important to you in the future with finding out test results, communicating by private email, and scheduling acute appointments online when needed.  Please return in 6 months, or sooner if needed, with Lab testing done 3-5 days before

## 2013-03-13 NOTE — Assessment & Plan Note (Signed)

## 2013-03-13 NOTE — Progress Notes (Signed)
Subjective:    Patient ID: Wayne Diaz, male    DOB: Mar 27, 1961, 52 y.o.   MRN: [id number]  HPI  Here for wellness and f/u;  Overall doing ok;  Pt denies CP, worsening SOB, DOE, wheezing, orthopnea, PND, worsening LE edema, palpitations, dizziness or syncope.  Pt denies neurological change such as new headache, facial or extremity weakness.  Pt denies polydipsia, polyuria, though has had several low sugar symptoms episodes and sometimes cuts back on the total insulin dose per day by about 10 units.  Trying to lose wt - lost a few lbs intentionally/ Pt states overall good compliance with treatment and medications, good tolerability, and has been trying to follow lower cholesterol diet.  Pt denies worsening depressive symptoms, suicidal ideation or panic. No fever, night sweats, loss of appetite, or other constitutional symptoms.  Pt states good ability with ADL's, has low fall risk, home safety reviewed and adequate, no other significant changes in hearing or vision, and only occasionally active with exercise. Past Medical History  Diagnosis Date  . HYPERLIPIDEMIA 05/05/2008  . GERD 05/05/2008  . DIABETES MELLITUS, TYPE II 04/28/2008  . ASTHMA 05/05/2008  . ALLERGIC RHINITIS 05/05/2008  . SMOKER 03/10/2009  . HYPERSOMNIA 03/10/2009   Past Surgical History  Procedure Laterality Date  . S/p left knee surgury      ACL repair  . Tonsillectomy      reports that he has been smoking.  He does not have any smokeless tobacco history on file. He reports that he does not drink alcohol or use illicit drugs. family history includes Arthritis in his other; Diabetes in his other; Heart disease in his other; Hyperlipidemia in his other; and Hypertension in his other. No Known Allergies Current Outpatient Prescriptions on File Prior to Visit  Medication Sig Dispense Refill  . aspirin 81 MG tablet Take 81 mg by mouth daily.        Marland Kitchen CIALIS 20 MG tablet TAKE 1 TABLET BY MOUTH ONCE DAILY AS NEEDED  5 each  1  . Insulin Pen  Needle (PEN NEEDLES 31GX5/16") 31G X 8 MM MISC 1 application by Does not apply route daily. Use as directed once daily  100 each  3  . Lancets MISC 1 application by Does not apply route daily. Use as directed three times a day  100 each  3  . sitaGLIPtan-metformin (JANUMET) 50-1000 MG per tablet Take 1 tablet by mouth 2 (two) times daily at 10 AM and 5 PM.  180 tablet  3   No current facility-administered medications on file prior to visit.   Review of Systems Constitutional: Negative for diaphoresis, activity change, appetite change or unexpected weight change.  HENT: Negative for hearing loss, ear pain, facial swelling, mouth sores and neck stiffness.   Eyes: Negative for pain, redness and visual disturbance.  Respiratory: Negative for shortness of breath and wheezing.   Cardiovascular: Negative for chest pain and palpitations.  Gastrointestinal: Negative for diarrhea, blood in stool, abdominal distention or other pain Genitourinary: Negative for hematuria, flank pain or change in urine volume.  Musculoskeletal: Negative for myalgias and joint swelling.  Skin: Negative for color change and wound.  Neurological: Negative for syncope and numbness. other than noted Hematological: Negative for adenopathy.  Psychiatric/Behavioral: Negative for hallucinations, self-injury, decreased concentration and agitation.      Objective:   Physical Exam BP 110/62  Pulse 93  Temp(Src) 97.4 F (36.3 C) (Oral)  Ht 5\' 9"  (1.753 m)  Wt 204 lb (92.534  kg)  BMI 30.11 kg/m2  SpO2 96% VS noted,  Constitutional: Pt is oriented to person, place, and time. Appears well-developed and well-nourished.  Head: Normocephalic and atraumatic.  Right Ear: External ear normal.  Left Ear: External ear normal.  Nose: Nose normal.  Mouth/Throat: Oropharynx is clear and moist.  Eyes: Conjunctivae and EOM are normal. Pupils are equal, round, and reactive to light.  Neck: Normal range of motion. Neck supple. No JVD  present. No tracheal deviation present.  Cardiovascular: Normal rate, regular rhythm, normal heart sounds and intact distal pulses.   Pulmonary/Chest: Effort normal and breath sounds normal.  Abdominal: Soft. Bowel sounds are normal. There is no tenderness. No HSM  Musculoskeletal: Normal range of motion. Exhibits no edema.  Lymphadenopathy:  Has no cervical adenopathy.  Neurological: Pt is alert and oriented to person, place, and time. Pt has normal reflexes. No cranial nerve deficit.  Skin: Skin is warm and dry. No rash noted.  Psychiatric:  Has  normal mood and affect - minor nervous. Behavior is normal.     Assessment & Plan:

## 2013-03-15 ENCOUNTER — Other Ambulatory Visit: Payer: Self-pay | Admitting: Internal Medicine

## 2013-04-04 ENCOUNTER — Other Ambulatory Visit: Payer: Self-pay | Admitting: Internal Medicine

## 2013-04-09 ENCOUNTER — Encounter: Payer: Self-pay | Admitting: Internal Medicine

## 2013-04-09 ENCOUNTER — Ambulatory Visit (INDEPENDENT_AMBULATORY_CARE_PROVIDER_SITE_OTHER): Payer: BC Managed Care – PPO | Admitting: Internal Medicine

## 2013-04-09 VITALS — BP 110/68 | HR 95 | Temp 98.6°F | Ht 69.5 in | Wt 201.0 lb

## 2013-04-09 DIAGNOSIS — E119 Type 2 diabetes mellitus without complications: Secondary | ICD-10-CM

## 2013-04-09 DIAGNOSIS — L259 Unspecified contact dermatitis, unspecified cause: Secondary | ICD-10-CM

## 2013-04-09 DIAGNOSIS — J45909 Unspecified asthma, uncomplicated: Secondary | ICD-10-CM

## 2013-04-09 MED ORDER — METHYLPREDNISOLONE ACETATE 80 MG/ML IJ SUSP: 120.0000 mg | Freq: Once | INTRAMUSCULAR | Status: DC

## 2013-04-09 MED ORDER — METHYLPREDNISOLONE ACETATE 80 MG/ML IJ SUSP
80.0000 mg | Freq: Once | INTRAMUSCULAR | Status: AC
  Administered 2013-04-09: 80 mg via INTRAMUSCULAR

## 2013-04-09 MED ORDER — PREDNISONE 10 MG PO TABS: ORAL_TABLET | ORAL | Status: DC

## 2013-04-09 MED ORDER — PRAVASTATIN SODIUM 40 MG PO TABS: ORAL_TABLET | ORAL | Status: DC

## 2013-04-09 MED ORDER — TRIAMCINOLONE ACETONIDE 0.1 % EX CREA: TOPICAL_CREAM | Freq: Two times a day (BID) | CUTANEOUS | Status: DC

## 2013-04-09 NOTE — Assessment & Plan Note (Signed)
mod, for depomedrol IM, predpack asd,  to f/u any worsening symptoms or concerns, and atarax prn itching

## 2013-04-09 NOTE — Assessment & Plan Note (Signed)
stable overall by history and exam, recent data reviewed with pt, and pt to continue medical treatment as before,  to f/u any worsening symptoms or concerns SpO2 Readings from Last 3 Encounters:  04/09/13 95%  03/13/13 96%  10/14/12 95%

## 2013-04-09 NOTE — Patient Instructions (Signed)
You had the steroid shot today Please take all new medication as prescribed - the "lower dose" prednisone, as well as topical cream if needed Please continue all other medications as before, except ok to increase the Lantus to 30 units twice per day (with checking your sugars three times per day) while on the prednisone After the prednisone is done, then return to the prior dose of Lantus at 40 units per day  Thank you for enrolling in MyChart. Please follow the instructions below to securely access your online medical record. MyChart allows you to send messages to your doctor, view your test results, renew your prescriptions, schedule appointments, and more.

## 2013-04-09 NOTE — Progress Notes (Signed)
Subjective:    Patient ID: Wayne Diaz, male    DOB: 03/02/1961, 52 y.o.   MRN: [id number]  HPI  Here to f/u with 2-3 days acute onset rather severe itchy rash to right arm and axilla, and several other more minor lesions to lower abdomen after working in the yard with right side toward the vegetation that he was working on.   Pt denies fever, wt loss, night sweats, loss of appetite, or other constitutional symptoms. Nothing seems to make better or worse, though hasnt tried antihist or benadryl cream.  Pt denies chest pain, increased sob or doe, wheezing, orthopnea, PND, increased LE swelling, palpitations, dizziness or syncope. No tongue swelling, wheezing, or other swelling.   Pt denies polydipsia, polyuria, though is on lantus 40 units, and with prednisone tx in past, sugars went to 400-500. Past Medical History  Diagnosis Date  . HYPERLIPIDEMIA 05/05/2008  . GERD 05/05/2008  . DIABETES MELLITUS, TYPE II 04/28/2008  . ASTHMA 05/05/2008  . ALLERGIC RHINITIS 05/05/2008  . SMOKER 03/10/2009  . HYPERSOMNIA 03/10/2009   Past Surgical History  Procedure Laterality Date  . S/p left knee surgury      ACL repair  . Tonsillectomy      reports that he has been smoking.  He does not have any smokeless tobacco history on file. He reports that he does not drink alcohol or use illicit drugs. family history includes Arthritis in his other; Diabetes in his other; Heart disease in his other; Hyperlipidemia in his other; and Hypertension in his other. No Known Allergies Current Outpatient Prescriptions on File Prior to Visit  Medication Sig Dispense Refill  . aspirin 81 MG tablet Take 81 mg by mouth daily.        Marland Kitchen CIALIS 20 MG tablet TAKE 1 TABLET BY MOUTH ONCE DAILY AS NEEDED  5 each  1  . glucose blood (ONE TOUCH ULTRA TEST) test strip Use to test blood sugar at least once daily dx 250.00  100 each  3  . insulin glargine (LANTUS) 100 UNIT/ML injection Inject 0.4 mLs (40 Units total) into the skin daily.  10 mL  11   . Insulin Pen Needle (PEN NEEDLES 31GX5/16") 31G X 8 MM MISC 1 application by Does not apply route daily. Use as directed once daily  100 each  3  . Lancets MISC 1 application by Does not apply route daily. Use as directed three times a day  100 each  3  . sitaGLIPtan-metformin (JANUMET) 50-1000 MG per tablet Take 1 tablet by mouth 2 (two) times daily at 10 AM and 5 PM.  180 tablet  3  . sitaGLIPtan-metformin (JANUMET) 50-1000 MG per tablet Take 1 tablet by mouth 2 (two) times daily.  180 tablet  3  . varenicline (CHANTIX CONTINUING MONTH PAK) 1 MG tablet Take 1 tablet (1 mg total) by mouth 2 (two) times daily.  60 tablet  1   No current facility-administered medications on file prior to visit.   Review of Systems  Constitutional: Negative for unexpected weight change, or unusual diaphoresis  HENT: Negative for tinnitus.   Eyes: Negative for photophobia and visual disturbance.  Respiratory: Negative for choking and stridor.   Gastrointestinal: Negative for vomiting and blood in stool.  Genitourinary: Negative for hematuria and decreased urine volume.  Musculoskeletal: Negative for acute joint swelling Skin: Negative for color change and wound.  Neurological: Negative for tremors and numbness other than noted  Psychiatric/Behavioral: Negative for decreased concentration or  hyperactivity.  Objective:   Physical Exam BP 110/68  Pulse 95  Temp(Src) 98.6 F (37 C) (Oral)  Ht 5' 9.5" (1.765 m)  Wt 201 lb (91.173 kg)  BMI 29.27 kg/m2  SpO2 95% VS noted,  Constitutional: Pt appears well-developed and well-nourished.  HENT: Head: NCAT.  Right Ear: External ear normal.  Left Ear: External ear normal.  Eyes: Conjunctivae and EOM are normal. Pupils are equal, round, and reactive to light.  Neck: Normal range of motion. Neck supple.  Cardiovascular: Normal rate and regular rhythm.   Pulmonary/Chest: Effort normal and breath sounds normal.  Abd:  Soft, NT, non-distended, +  BS Neurological: Pt is alert. Not confused , motor 5/5 Skin: with RUE TNTC erythem slight raised nontender lesions some weepy c/w contact dermatitis, worst area is 8x6 cm plaquelike area to right axilla with excoritaitons, no fluctance or drainage Psychiatric: Pt behavior is normal. Thought content normal. mild nervous    Assessment & Plan:

## 2013-04-09 NOTE — Assessment & Plan Note (Signed)
stable overall by history and exam, recent data reviewed with pt, and pt to continue medical treatment as before,  to f/u any worsening symptoms or concerns Lab Results  Component Value Date   HGBA1C 7.6* 03/09/2013   Ok to increase the lantus to 30 bid while taking prednisone, watch for overly low or high sugars

## 2013-05-31 ENCOUNTER — Other Ambulatory Visit: Payer: Self-pay | Admitting: Internal Medicine

## 2013-08-26 ENCOUNTER — Telehealth: Payer: Self-pay | Admitting: Internal Medicine

## 2013-08-26 ENCOUNTER — Encounter (HOSPITAL_COMMUNITY): Payer: Self-pay | Admitting: Emergency Medicine

## 2013-08-26 ENCOUNTER — Emergency Department (HOSPITAL_COMMUNITY): Payer: BC Managed Care – PPO

## 2013-08-26 ENCOUNTER — Ambulatory Visit: Payer: BC Managed Care – PPO | Admitting: Internal Medicine

## 2013-08-26 ENCOUNTER — Emergency Department (HOSPITAL_COMMUNITY)
Admission: EM | Admit: 2013-08-26 | Discharge: 2013-08-26 | Disposition: A | Discharge status: Home / Self Care | Payer: BC Managed Care – PPO | Attending: Emergency Medicine | Admitting: Emergency Medicine

## 2013-08-26 DIAGNOSIS — J45909 Unspecified asthma, uncomplicated: Secondary | ICD-10-CM | POA: Insufficient documentation

## 2013-08-26 DIAGNOSIS — Z794 Long term (current) use of insulin: Secondary | ICD-10-CM | POA: Insufficient documentation

## 2013-08-26 DIAGNOSIS — E119 Type 2 diabetes mellitus without complications: Secondary | ICD-10-CM | POA: Insufficient documentation

## 2013-08-26 DIAGNOSIS — G510 Bell's palsy: Secondary | ICD-10-CM

## 2013-08-26 DIAGNOSIS — E785 Hyperlipidemia, unspecified: Secondary | ICD-10-CM | POA: Insufficient documentation

## 2013-08-26 DIAGNOSIS — Z79899 Other long term (current) drug therapy: Secondary | ICD-10-CM | POA: Insufficient documentation

## 2013-08-26 DIAGNOSIS — F17200 Nicotine dependence, unspecified, uncomplicated: Secondary | ICD-10-CM | POA: Insufficient documentation

## 2013-08-26 DIAGNOSIS — Z8719 Personal history of other diseases of the digestive system: Secondary | ICD-10-CM | POA: Insufficient documentation

## 2013-08-26 MED ORDER — VALACYCLOVIR HCL 1 G PO TABS: 1000.0000 mg | ORAL_TABLET | Freq: Three times a day (TID) | ORAL | Status: AC

## 2013-08-26 MED ORDER — ARTIFICIAL TEARS OP OINT: TOPICAL_OINTMENT | OPHTHALMIC | Status: DC | PRN

## 2013-08-26 MED ORDER — PREDNISONE 10 MG PO TABS: ORAL_TABLET | ORAL | Status: DC
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
## 2013-08-26 NOTE — ED Notes (Signed)
Wayne Silk, PA ordered pt to be moved to acute side. Charge nurse notified. Pt moved to D 30.
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
## 2013-08-26 NOTE — ED Notes (Signed)
Pt discharged.Vital signs stable and GCS 15 

## 2013-08-26 NOTE — ED Notes (Signed)
Pt moved to POD A-5. Report to K.Cobb, Charity fundraiser.
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
## 2013-08-27 ENCOUNTER — Telehealth: Payer: Self-pay | Admitting: Internal Medicine

## 2013-08-27 NOTE — Telephone Encounter (Signed)
Pt will call back once finished w/ conference call, Pt unable to talk.  Call back attempted at 1630 on 10-23.

## 2013-08-28 ENCOUNTER — Telehealth: Payer: Self-pay | Admitting: Internal Medicine

## 2013-08-28 NOTE — Telephone Encounter (Signed)
Ok to stop the prednisone as it is not clear if prednisone actually helps (though this is very commonly prescribed)  To cont all other meds

## 2013-08-28 NOTE — Telephone Encounter (Signed)
Patient informed. 

## 2013-08-28 NOTE — Telephone Encounter (Signed)
Patient dxed at ED with Bell's Palsy.  Given Prednisone.  States Prednisone is causing his BS to increase.  Last check was 325.  Is there something else that he can take that will not cause elevated BS?

## 2013-09-08 ENCOUNTER — Other Ambulatory Visit (INDEPENDENT_AMBULATORY_CARE_PROVIDER_SITE_OTHER): Payer: BC Managed Care – PPO

## 2013-09-08 DIAGNOSIS — IMO0001 Reserved for inherently not codable concepts without codable children: Secondary | ICD-10-CM

## 2013-09-08 LAB — BASIC METABOLIC PANEL
BUN: 23 mg/dL (ref 6–23)
CO2: 28 mEq/L (ref 19–32)
Calcium: 9.1 mg/dL (ref 8.4–10.5)
Chloride: 102 mEq/L (ref 96–112)
Creatinine, Ser: 0.8 mg/dL (ref 0.4–1.5)
Glucose, Bld: 209 mg/dL — ABNORMAL HIGH (ref 70–99)
Sodium: 137 mEq/L (ref 135–145)

## 2013-09-08 LAB — LIPID PANEL
Cholesterol: 143 mg/dL (ref 0–200)
HDL: 46.9 mg/dL (ref >39.00)
Total CHOL/HDL Ratio: 3
Triglycerides: 105 mg/dL (ref 0.0–149.0)

## 2013-09-08 LAB — HEPATIC FUNCTION PANEL
ALT: 20 U/L (ref 0–53)
AST: 13 U/L (ref 0–37)
Albumin: 3.8 g/dL (ref 3.5–5.2)
Bilirubin, Direct: 0.1 mg/dL (ref 0.0–0.3)
Total Protein: 6.6 g/dL (ref 6.0–8.3)

## 2013-09-08 LAB — HEMOGLOBIN A1C: Hgb A1c MFr Bld: 9 % — ABNORMAL HIGH (ref 4.6–6.5)

## 2013-09-10 ENCOUNTER — Other Ambulatory Visit: Payer: Self-pay

## 2013-09-11 ENCOUNTER — Encounter: Payer: Self-pay | Admitting: Internal Medicine

## 2013-09-11 ENCOUNTER — Ambulatory Visit (INDEPENDENT_AMBULATORY_CARE_PROVIDER_SITE_OTHER): Payer: BC Managed Care – PPO | Admitting: Internal Medicine

## 2013-09-11 VITALS — BP 110/70 | HR 96 | Temp 97.4°F | Ht 69.0 in | Wt 199.0 lb

## 2013-09-11 DIAGNOSIS — E119 Type 2 diabetes mellitus without complications: Secondary | ICD-10-CM

## 2013-09-11 DIAGNOSIS — E785 Hyperlipidemia, unspecified: Secondary | ICD-10-CM

## 2013-09-11 DIAGNOSIS — G4710 Hypersomnia, unspecified: Secondary | ICD-10-CM

## 2013-09-11 DIAGNOSIS — Z0000 Encounter for general adult medical examination without abnormal findings: Secondary | ICD-10-CM

## 2013-09-11 DIAGNOSIS — J309 Allergic rhinitis, unspecified: Secondary | ICD-10-CM

## 2013-09-11 DIAGNOSIS — J45909 Unspecified asthma, uncomplicated: Secondary | ICD-10-CM

## 2013-09-11 MED ORDER — GLUCOSE BLOOD VI STRP: ORAL_STRIP | Status: DC

## 2013-09-11 NOTE — Assessment & Plan Note (Signed)
Mild elev due to steroid tx, to cont meds as is, work on diet, wt loss, f/u labs next visit

## 2013-09-11 NOTE — Assessment & Plan Note (Signed)
stable overall by history and exam, recent data reviewed with pt, and pt to continue medical treatment as before,  to f/u any worsening symptoms or concerns SpO2 Readings from Last 3 Encounters:  09/11/13 95%  08/26/13 96%  04/09/13 95%

## 2013-09-11 NOTE — Assessment & Plan Note (Signed)
stable overall by history and exam, recent data reviewed with pt, and pt to continue medical treatment as before,  to f/u any worsening symptoms or concerns Lab Results  Component Value Date   LDLCALC 75 09/08/2013

## 2013-09-11 NOTE — Progress Notes (Signed)
Pre visit review using our clinic review tool, if applicable. No additional management support is needed unless otherwise documented below in the visit note. 

## 2013-09-11 NOTE — Assessment & Plan Note (Signed)
With marked snoring at night, but as allergies improed with recent steroid tx, doubt is significant element with possible sleep apnea/upper airway resistance

## 2013-09-11 NOTE — Assessment & Plan Note (Signed)
Suspect prob osa - for pulm referral

## 2013-09-11 NOTE — Addendum Note (Signed)
Addended by: Scharlene Gloss B on: 09/11/2013 09:01 AM   Modules accepted: Orders

## 2013-09-11 NOTE — Patient Instructions (Signed)
Please continue all other medications as before, and refills have been done if requested. Please have the pharmacy call with any other refills you may need.  You will be contacted regarding the referral for: pulmonary to look into possible sleep apnea  Please continue your efforts at being more active, low cholesterol diabetic diet, and weight loss with less calories and more exercise.  Please return in 6 months, or sooner if needed, with Lab testing done 3-5 days before

## 2013-09-11 NOTE — Progress Notes (Signed)
Subjective:    Patient ID: Wayne Diaz, male    DOB: 1961-06-18, 52 y.o.   MRN: [id number]  HPI  Here to f/u; Here to f/u; overall doing ok,  Pt denies chest pain, increased sob or doe, wheezing, orthopnea, PND, increased LE swelling, palpitations, dizziness or syncope.  Pt denies polydipsia, polyuria, or low sugar symptoms such as weakness or confusion improved with po intake.  Pt denies new neurological symptoms such as new headache, or facial or extremity weakness or numbness.   Pt states overall good compliance with meds, has been trying to follow lower cholesterol, diabetic diet, with wt overall stable; had an indicent where struck by dog accidentally at the vet during an exam struck in the right face/jaw, with immediate Bells type palsy as he left the office with the dog. Did not immed realize the significance, had no pain but had some right eyelid weakness , difficulty talking and right facial weakness.  Wife was out of town , he event called here, advised to ER - ? Stroke, CT head and maxillofacial neg for fx, tx with antiviral and predpack x 2wks with cbgs up to 400's on tx.  Did take some extra 15-20 units per day for sugar > 300.  The facial weakness did seem to improve after prednisone stopped, eye and facial weakness and speaking and ability to spit has improved in the last few days, now he estimates about 80% back to normal.  Also wife (not here) says he snores and he has signifcant daytime somnolence, lots of caffeine use, still smokes and thinking about cutting back but worried about further wt gain  Past Medical History  Diagnosis Date  . HYPERLIPIDEMIA 05/05/2008  . GERD 05/05/2008  . DIABETES MELLITUS, TYPE II 04/28/2008  . ASTHMA 05/05/2008  . ALLERGIC RHINITIS 05/05/2008  . SMOKER 03/10/2009  . HYPERSOMNIA 03/10/2009   Past Surgical History  Procedure Laterality Date  . S/p left knee surgury      ACL repair  . Tonsillectomy      reports that he has been smoking.  He does not have any  smokeless tobacco history on file. He reports that he does not drink alcohol or use illicit drugs. family history includes Arthritis in his other; Diabetes in his other; Heart disease in his other; Hyperlipidemia in his other; Hypertension in his other. No Known Allergies Current Outpatient Prescriptions on File Prior to Visit  Medication Sig Dispense Refill  . artificial tears (LACRILUBE) OINT ophthalmic ointment Apply to eye as needed. Apply to right eye every hour while awake and qhs prn for dry eye  1 Tube  0  . ibuprofen (ADVIL,MOTRIN) 200 MG tablet Take 400 mg by mouth once.      . insulin glargine (LANTUS) 100 UNIT/ML injection Inject 40 Units into the skin daily.      . pravastatin (PRAVACHOL) 40 MG tablet Take 80 mg by mouth daily. TAKE 2 TABLETS (80 MG TOTAL) BY MOUTH DAILY.      . sitaGLIPtin-metformin (JANUMET) 50-1000 MG per tablet Take 1 tablet by mouth 2 (two) times daily with a meal.       No current facility-administered medications on file prior to visit.   Review of Systems  Constitutional: Negative for unexpected weight change, or unusual diaphoresis  HENT: Negative for tinnitus.   Eyes: Negative for photophobia and visual disturbance.  Respiratory: Negative for choking and stridor.   Gastrointestinal: Negative for vomiting and blood in stool.  Genitourinary: Negative for hematuria and  decreased urine volume.  Musculoskeletal: Negative for acute joint swelling Skin: Negative for color change and wound.  Neurological: Negative for tremors and numbness other than noted  Psychiatric/Behavioral: Negative for decreased concentration or  hyperactivity.       Objective:   Physical Exam BP 110/70  Pulse 96  Temp(Src) 97.4 F (36.3 C) (Oral)  Ht 5\' 9"  (1.753 m)  Wt 199 lb (90.266 kg)  BMI 29.37 kg/m2  SpO2 95% VS noted,  Constitutional: Pt appears well-developed and well-nourished.  HENT: Head: NCAT.  Right Ear: External ear normal.  Left Ear: External ear normal.   Eyes: Conjunctivae and EOM are normal. Pupils are equal, round, and reactive to light.  Neck: Normal range of motion. Neck supple.  Cardiovascular: Normal rate and regular rhythm.   Pulmonary/Chest: Effort normal and breath sounds normal.  Neurological: Pt is alert. Not confused , cn 2-12 intact Skin: Skin is warm. No erythema.  Psychiatric: Pt behavior is normal. Thought content normal.     Assessment & Plan:

## 2013-09-16 ENCOUNTER — Institutional Professional Consult (permissible substitution): Payer: BC Managed Care – PPO | Admitting: Internal Medicine

## 2014-02-06 ENCOUNTER — Other Ambulatory Visit: Payer: Self-pay | Admitting: Internal Medicine

## 2014-03-05 ENCOUNTER — Ambulatory Visit: Payer: BC Managed Care – PPO | Admitting: Internal Medicine

## 2014-03-05 DIAGNOSIS — Z0289 Encounter for other administrative examinations: Secondary | ICD-10-CM

## 2014-03-09 ENCOUNTER — Other Ambulatory Visit (INDEPENDENT_AMBULATORY_CARE_PROVIDER_SITE_OTHER): Payer: BC Managed Care – PPO

## 2014-03-09 DIAGNOSIS — E119 Type 2 diabetes mellitus without complications: Secondary | ICD-10-CM

## 2014-03-09 DIAGNOSIS — Z0000 Encounter for general adult medical examination without abnormal findings: Secondary | ICD-10-CM

## 2014-03-09 LAB — CBC WITH DIFFERENTIAL/PLATELET
BASOS ABS: 0 10*3/uL (ref 0.0–0.1)
Basophils Relative: 0.4 % (ref 0.0–3.0)
EOS PCT: 1.5 % (ref 0.0–5.0)
Eosinophils Absolute: 0.1 10*3/uL (ref 0.0–0.7)
HCT: 47.5 % (ref 39.0–52.0)
Hemoglobin: 16.1 g/dL (ref 13.0–17.0)
LYMPHS PCT: 25.8 % (ref 12.0–46.0)
Lymphs Abs: 2.5 10*3/uL (ref 0.7–4.0)
MCHC: 33.9 g/dL (ref 30.0–36.0)
MCV: 89.4 fl (ref 78.0–100.0)
MONOS PCT: 5.3 % (ref 3.0–12.0)
Monocytes Absolute: 0.5 10*3/uL (ref 0.1–1.0)
NEUTROS PCT: 66.7 % (ref 43.0–77.0)
Neutro Abs: 6.3 10*3/uL (ref 1.4–7.7)
PLATELETS: 122 10*3/uL — AB (ref 150.0–400.0)
RBC: 5.31 Mil/uL (ref 4.22–5.81)
RDW: 13 % (ref 11.5–14.6)
WBC: 9.3 10*3/uL (ref 4.5–10.5)

## 2014-03-09 LAB — URINALYSIS, ROUTINE W REFLEX MICROSCOPIC
Bilirubin Urine: NEGATIVE
Hgb urine dipstick: NEGATIVE
Ketones, ur: NEGATIVE
Leukocytes, UA: NEGATIVE
NITRITE: NEGATIVE
PH: 6 (ref 5.0–8.0)
RBC / HPF: NONE SEEN (ref 0–?)
Total Protein, Urine: NEGATIVE
UROBILINOGEN UA: 0.2 (ref 0.0–1.0)
Urine Glucose: 500 — AB

## 2014-03-09 LAB — BASIC METABOLIC PANEL
BUN: 18 mg/dL (ref 6–23)
CO2: 29 meq/L (ref 19–32)
Calcium: 9.2 mg/dL (ref 8.4–10.5)
Chloride: 103 mEq/L (ref 96–112)
Creatinine, Ser: 0.9 mg/dL (ref 0.4–1.5)
GFR: 89.99 mL/min (ref >60.00)
GLUCOSE: 149 mg/dL — AB (ref 70–99)
POTASSIUM: 4.3 meq/L (ref 3.5–5.1)
Sodium: 139 mEq/L (ref 135–145)

## 2014-03-09 LAB — LIPID PANEL
CHOLESTEROL: 148 mg/dL (ref 0–200)
HDL: 30.7 mg/dL — ABNORMAL LOW (ref >39.00)
LDL Cholesterol: 55 mg/dL (ref 0–99)
Total CHOL/HDL Ratio: 5
Triglycerides: 314 mg/dL — ABNORMAL HIGH (ref 0.0–149.0)
VLDL: 62.8 mg/dL — ABNORMAL HIGH (ref 0.0–40.0)

## 2014-03-09 LAB — HEPATIC FUNCTION PANEL
ALBUMIN: 3.8 g/dL (ref 3.5–5.2)
ALT: 18 U/L (ref 0–53)
AST: 16 U/L (ref 0–37)
Alkaline Phosphatase: 53 U/L (ref 39–117)
Bilirubin, Direct: 0.1 mg/dL (ref 0.0–0.3)
TOTAL PROTEIN: 6.1 g/dL (ref 6.0–8.3)
Total Bilirubin: 0.5 mg/dL (ref 0.2–1.2)

## 2014-03-09 LAB — MICROALBUMIN / CREATININE URINE RATIO
Creatinine,U: 235.6 mg/dL
MICROALB/CREAT RATIO: 0.3 mg/g (ref 0.0–30.0)
Microalb, Ur: 0.7 mg/dL (ref 0.0–1.9)

## 2014-03-09 LAB — TSH: TSH: 2.24 u[IU]/mL (ref 0.35–4.50)

## 2014-03-09 LAB — PSA: PSA: 0.34 ng/mL (ref 0.10–4.00)

## 2014-03-09 LAB — HEMOGLOBIN A1C: HEMOGLOBIN A1C: 8.7 % — AB (ref 4.6–6.5)

## 2014-03-17 ENCOUNTER — Ambulatory Visit (INDEPENDENT_AMBULATORY_CARE_PROVIDER_SITE_OTHER): Payer: BC Managed Care – PPO | Admitting: Internal Medicine

## 2014-03-17 ENCOUNTER — Telehealth: Payer: Self-pay | Admitting: Internal Medicine

## 2014-03-17 ENCOUNTER — Encounter: Payer: Self-pay | Admitting: Internal Medicine

## 2014-03-17 VITALS — BP 110/70 | HR 90 | Temp 98.3°F | Wt 209.1 lb

## 2014-03-17 DIAGNOSIS — Z0000 Encounter for general adult medical examination without abnormal findings: Secondary | ICD-10-CM

## 2014-03-17 DIAGNOSIS — E119 Type 2 diabetes mellitus without complications: Secondary | ICD-10-CM

## 2014-03-17 MED ORDER — INSULIN GLARGINE 300 UNIT/ML ~~LOC~~ SOPN: 50.0000 [IU] | PEN_INJECTOR | Freq: Every day | SUBCUTANEOUS | Status: DC

## 2014-03-17 MED ORDER — GLUCOSE BLOOD VI STRP: ORAL_STRIP | Status: DC

## 2014-03-17 MED ORDER — TADALAFIL 20 MG PO TABS: ORAL_TABLET | ORAL | Status: DC

## 2014-03-17 MED ORDER — INSULIN GLARGINE 100 UNIT/ML ~~LOC~~ SOLN: 50.0000 [IU] | Freq: Every day | SUBCUTANEOUS | Status: DC

## 2014-03-17 NOTE — Assessment & Plan Note (Signed)

## 2014-03-17 NOTE — Telephone Encounter (Signed)
Relevant patient education assigned to patient using Emmi. ° °

## 2014-03-17 NOTE — Progress Notes (Signed)
Subjective:    Patient ID: Wayne Diaz, male    DOB: 02/19/1961, 53 y.o.   MRN: [id number]  HPI  Here for wellness and f/u;  Overall doing ok;  Pt denies CP, worsening SOB, DOE, wheezing, orthopnea, PND, worsening LE edema, palpitations, dizziness or syncope.  Pt denies neurological change such as new headache, facial or extremity weakness.  Pt denies polydipsia, polyuria, or low sugar symptoms. Pt states overall good compliance with treatment and medications, good tolerability, and has been trying to follow lower cholesterol diet.  Pt denies worsening depressive symptoms, suicidal ideation or panic. No fever, night sweats, wt loss, loss of appetite, or other constitutional symptoms.  Pt states good ability with ADL's, has low fall risk, home safety reviewed and adequate, no other significant changes in hearing or vision, and only occasionally active with exercise. Gained sevaral lbs in last 6 mo - approx 6-7. ?OSA , but delcines referral at this time  CBG';s often 137 - 180 in am, higher with cookies the night before.  Did have a traumatic right facial nerve palsy recently with taking a large dog to the vet. Tx with prednisone form ER with sugars 500 occasionally. Past Medical History  Diagnosis Date  . HYPERLIPIDEMIA 05/05/2008  . GERD 05/05/2008  . DIABETES MELLITUS, TYPE II 04/28/2008  . ASTHMA 05/05/2008  . ALLERGIC RHINITIS 05/05/2008  . SMOKER 03/10/2009  . HYPERSOMNIA 03/10/2009   Past Surgical History  Procedure Laterality Date  . S/p left knee surgury      ACL repair  . Tonsillectomy      reports that he has been smoking.  He does not have any smokeless tobacco history on file. He reports that he does not drink alcohol or use illicit drugs. family history includes Arthritis in his other; Diabetes in his other; Heart disease in his other; Hyperlipidemia in his other; Hypertension in his other. No Known Allergies Current Outpatient Prescriptions on File Prior to Visit  Medication Sig Dispense  Refill  . CIALIS 20 MG tablet TAKE 1 TABLET BY MOUTH ONCE DAILY AS NEEDED  5 tablet  3  . glucose blood (ONE TOUCH ULTRA TEST) test strip Use as directed once daily to check blood sugar.  Diagnosis code 250.00  100 each  11  . ibuprofen (ADVIL,MOTRIN) 200 MG tablet Take 400 mg by mouth once.      . insulin glargine (LANTUS) 100 UNIT/ML injection Inject 40 Units into the skin daily.      . pravastatin (PRAVACHOL) 40 MG tablet Take 80 mg by mouth daily. TAKE 2 TABLETS (80 MG TOTAL) BY MOUTH DAILY.      . sitaGLIPtin-metformin (JANUMET) 50-1000 MG per tablet Take 1 tablet by mouth 2 (two) times daily with a meal.       No current facility-administered medications on file prior to visit.    Review of Systems Constitutional: Negative for increased diaphoresis, other activity, appetite or other siginficant weight change  HENT: Negative for worsening hearing loss, ear pain, facial swelling, mouth sores and neck stiffness.   Eyes: Negative for other worsening pain, redness or visual disturbance.  Respiratory: Negative for shortness of breath and wheezing.   Cardiovascular: Negative for chest pain and palpitations.  Gastrointestinal: Negative for diarrhea, blood in stool, abdominal distention or other pain Genitourinary: Negative for hematuria, flank pain or change in urine volume.  Musculoskeletal: Negative for myalgias or other joint complaints.  Skin: Negative for color change and wound.  Neurological: Negative for syncope and numbness.  other than noted Hematological: Negative for adenopathy. or other swelling Psychiatric/Behavioral: Negative for hallucinations, self-injury, decreased concentration or other worsening agitation.      Objective:   Physical Exam BP 110/70  Pulse 90  Temp(Src) 98.3 F (36.8 C) (Oral)  Wt 209 lb 1.9 oz (94.856 kg)  SpO2 95% VS noted,  Constitutional: Pt is oriented to person, place, and time. Appears well-developed and well-nourished.  Head: Normocephalic  and atraumatic.  Right Ear: External ear normal.  Left Ear: External ear normal.  Nose: Nose normal.  Mouth/Throat: Oropharynx is clear and moist.  Eyes: Conjunctivae and EOM are normal. Pupils are equal, round, and reactive to light.  Neck: Normal range of motion. Neck supple. No JVD present. No tracheal deviation present.  Cardiovascular: Normal rate, regular rhythm, normal heart sounds and intact distal pulses.   Pulmonary/Chest: Effort normal and breath sounds without rales or wheezing  Abdominal: Soft. Bowel sounds are normal. NT. No HSM  Musculoskeletal: Normal range of motion. Exhibits no edema.  Lymphadenopathy:  Has no cervical adenopathy.  Neurological: Pt is alert and oriented to person, place, and time. Pt has normal reflexes. No cranial nerve deficit. Motor grossly intact Skin: Skin is warm and dry. No rash noted.  Psychiatric:  Has normal mood and affect. Behavior is normal.     Assessment & Plan:

## 2014-03-17 NOTE — Assessment & Plan Note (Addendum)
stable overall by history and exam, recent data reviewed with pt, and pt to continue medical treatment as before,  to f/u any worsening symptoms or concerns Lab Results  Component Value Date   HGBA1C 8.7* 03/09/2014   Needs incresaed lantus to 50, ok to change to toujeo if work with his insurance

## 2014-03-17 NOTE — Patient Instructions (Signed)
OK to increase the Lantus to 50 units per day  OK to change the Lantus to Toujeo (same dose) if this works with [employer]your insurance, and is less expensive  Please continue all other medications as before, and refills have been done if requested. Please have the pharmacy call with any other refills you may need.  Please continue your efforts at being more active, low cholesterol diet, and weight control.  Please keep your appointments with your specialists as you may have planned  Please call if you change your mind about a referral to Pulmonary for the ? Of sleep apnea  Your lab work was otherwise OK today  Please return in 6 months, or sooner if needed, with Lab testing done 3-5 days before

## 2014-03-17 NOTE — Progress Notes (Signed)
Pre visit review using our clinic review tool, if applicable. No additional management support is needed unless otherwise documented below in the visit note. 

## 2014-03-25 ENCOUNTER — Other Ambulatory Visit: Payer: Self-pay | Admitting: *Deleted

## 2014-03-25 NOTE — Telephone Encounter (Signed)
Received fax pt needing PA on his cialis. Completed PA on cover my meds yesterday (Wed). Received confirmation fax med has been approved. Notified pharmacy with approval status...Raechel Chute/lmb

## 2014-04-17 ENCOUNTER — Other Ambulatory Visit: Payer: Self-pay | Admitting: Internal Medicine

## 2014-05-01 ENCOUNTER — Other Ambulatory Visit: Payer: Self-pay | Admitting: Internal Medicine

## 2014-06-05 ENCOUNTER — Other Ambulatory Visit: Payer: Self-pay | Admitting: Internal Medicine

## 2014-08-20 ENCOUNTER — Other Ambulatory Visit: Payer: Self-pay

## 2014-08-24 ENCOUNTER — Encounter (HOSPITAL_COMMUNITY): Payer: Self-pay | Admitting: Emergency Medicine

## 2014-08-24 DIAGNOSIS — E119 Type 2 diabetes mellitus without complications: Secondary | ICD-10-CM | POA: Insufficient documentation

## 2014-08-24 DIAGNOSIS — Z8669 Personal history of other diseases of the nervous system and sense organs: Secondary | ICD-10-CM | POA: Diagnosis not present

## 2014-08-24 DIAGNOSIS — E785 Hyperlipidemia, unspecified: Secondary | ICD-10-CM | POA: Insufficient documentation

## 2014-08-24 DIAGNOSIS — F419 Anxiety disorder, unspecified: Secondary | ICD-10-CM | POA: Diagnosis not present

## 2014-08-24 DIAGNOSIS — Z720 Tobacco use: Secondary | ICD-10-CM | POA: Diagnosis not present

## 2014-08-24 DIAGNOSIS — J45909 Unspecified asthma, uncomplicated: Secondary | ICD-10-CM | POA: Insufficient documentation

## 2014-08-24 DIAGNOSIS — I10 Essential (primary) hypertension: Secondary | ICD-10-CM | POA: Diagnosis not present

## 2014-08-24 DIAGNOSIS — Z7982 Long term (current) use of aspirin: Secondary | ICD-10-CM | POA: Insufficient documentation

## 2014-08-24 DIAGNOSIS — Z8719 Personal history of other diseases of the digestive system: Secondary | ICD-10-CM | POA: Diagnosis not present

## 2014-08-24 DIAGNOSIS — Z794 Long term (current) use of insulin: Secondary | ICD-10-CM | POA: Diagnosis not present

## 2014-08-24 NOTE — ED Notes (Signed)
Pt reports that he had a "very stressful day at work". States he came home and felt very anxious. Wife checked BP and noted to be 174/97. Denies any HA, vision changes or neuro changes. States "I just felt lousy." Pt now states he feels mildly improved. Denies pain. Denies hx of HTN.

## 2014-08-25 ENCOUNTER — Emergency Department (HOSPITAL_COMMUNITY)
Admission: EM | Admit: 2014-08-25 | Discharge: 2014-08-25 | Disposition: A | Discharge status: Home / Self Care | Payer: BC Managed Care – PPO | Attending: Emergency Medicine | Admitting: Emergency Medicine

## 2014-08-25 DIAGNOSIS — I10 Essential (primary) hypertension: Secondary | ICD-10-CM

## 2014-08-25 NOTE — ED Provider Notes (Signed)
CSN: [id number]     Arrival date & time 08/24/14  2242 History   First MD Initiated Contact with Patient 08/25/14 0143     Chief Complaint  Patient presents with  . Hypertension  . Anxiety     (Consider location/radiation/quality/duration/timing/severity/associated sxs/prior Treatment) HPI Wayne CritchleyJose Diaz is a 53 y.o. male with past medical history of hyperlipidemia, diabetes coming in with high blood pressure. Patient states he had a very stressful day at work causing some anxiety. When he got home he took his blood pressure and it was 174/97. He states at that time he felt sluggish and tired. He also took his blood sugar and it was 228. This is right after eating dinner. He denies any headache blurry vision, chest pain or shortness of breath. Patient states he had no other symptoms other than feeling fatigued. This has never happened to him before, he states his blood pressure is usually in the 120s when he visits his primary care physician. He has no history of heart attack or strokes in the past. Patient states he now feels back to baseline and normal. He states his triage blood pressure was back to baseline as well. Patient has no further complaints.  10 Systems reviewed and are negative for acute change except as noted in the HPI.     Past Medical History  Diagnosis Date  . HYPERLIPIDEMIA 05/05/2008  . GERD 05/05/2008  . DIABETES MELLITUS, TYPE II 04/28/2008  . ASTHMA 05/05/2008  . ALLERGIC RHINITIS 05/05/2008  . SMOKER 03/10/2009  . HYPERSOMNIA 03/10/2009   Past Surgical History  Procedure Laterality Date  . S/p left knee surgury      ACL repair  . Tonsillectomy     Family History  Problem Relation Age of Onset  . Arthritis Other   . Hyperlipidemia Other   . Heart disease Other   . Hypertension Other   . Diabetes Other    History  Substance Use Topics  . Smoking status: Current Every Day Smoker -- 1.00 packs/day    Types: Cigarettes  . Smokeless tobacco: Not on file  . Alcohol  Use: No    Review of Systems    Allergies  Review of patient's allergies indicates no known allergies.  Home Medications   Prior to Admission medications   Medication Sig Start Date End Date Taking? Authorizing Provider  aspirin EC 81 MG tablet Take 81 mg by mouth once.   Yes Historical Provider, MD  glucose blood (ONE TOUCH ULTRA TEST) test strip Use as directed once daily to check blood sugar.  Diagnosis code 250.00 03/17/14  Yes Corwin LevinsJames W John, MD  Insulin Glargine (LANTUS SOLOSTAR) 100 UNIT/ML Solostar Pen Inject 40 Units into the skin daily after breakfast.   Yes Historical Provider, MD  pravastatin (PRAVACHOL) 40 MG tablet Take 80 mg by mouth daily.  04/09/13  Yes Corwin LevinsJames W John, MD  sitaGLIPtin-metformin (JANUMET) 50-1000 MG per tablet Take 1 tablet by mouth 2 (two) times daily with a meal.   Yes Historical Provider, MD   BP 138/84  Pulse 95  Temp(Src) 97.8 F (36.6 C) (Oral)  Resp 18  SpO2 98% Physical Exam  Nursing note and vitals reviewed. Constitutional: He is oriented to person, place, and time. Vital signs are normal. He appears well-developed and well-nourished.  Non-toxic appearance. He does not appear ill. No distress.  HENT:  Head: Normocephalic and atraumatic.  Nose: Nose normal.  Mouth/Throat: Oropharynx is clear and moist. No oropharyngeal exudate.  Eyes: Conjunctivae and  EOM are normal. Pupils are equal, round, and reactive to light. No scleral icterus.  Neck: Normal range of motion. Neck supple. No tracheal deviation, no edema, no erythema and normal range of motion present. No mass and no thyromegaly present.  Cardiovascular: Normal rate, regular rhythm, S1 normal, S2 normal, normal heart sounds, intact distal pulses and normal pulses.  Exam reveals no gallop and no friction rub.   No murmur heard. Pulses:      Radial pulses are 2+ on the right side, and 2+ on the left side.       Dorsalis pedis pulses are 2+ on the right side, and 2+ on the left side.   Pulmonary/Chest: Effort normal and breath sounds normal. No respiratory distress. He has no wheezes. He has no rhonchi. He has no rales.  Abdominal: Soft. Normal appearance and bowel sounds are normal. He exhibits no distension, no ascites and no mass. There is no hepatosplenomegaly. There is no tenderness. There is no rebound, no guarding and no CVA tenderness.  Musculoskeletal: Normal range of motion. He exhibits no edema and no tenderness.  Lymphadenopathy:    He has no cervical adenopathy.  Neurological: He is alert and oriented to person, place, and time. He has normal strength. No cranial nerve deficit or sensory deficit. He exhibits normal muscle tone. GCS eye subscore is 4. GCS verbal subscore is 5. GCS motor subscore is 6.  5 out of 5 strength x4 extremities. Normal sensation x4 extremities. Normal cerebellar testing.  Skin: Skin is warm, dry and intact. No petechiae and no rash noted. He is not diaphoretic. No erythema. No pallor.  Psychiatric: He has a normal mood and affect. His behavior is normal. Judgment normal.    ED Course  Procedures (including critical care time) Labs Review Labs Reviewed - No data to display  Imaging Review No results found.   EKG Interpretation None      MDM   Final diagnoses:  Essential hypertension    Patient was seen in the emergency department for evaluation of high blood pressure. Patient is not currently having any symptoms. I have low concern for stroke or ACS. Patient does not currently need any intervention is pressure here has been 120s systolic. I saw his primary care physician within 3 days for continued treatment. His vital signs are back to his normal limits, he is asymptomatic and he is safe for discharge.    Tomasita CrumbleAdeleke Tiawana Forgy, MD 08/25/14 [id number]

## 2014-08-25 NOTE — Discharge Instructions (Signed)
How to Take Your Blood Pressure Wayne Diaz, you were seen today for high blood pressure.  Here your blood pressure was 128/87.  Your blood pressure at home was high enough that she did follow up with your primary care physician within 3 days for evaluation and possible treatment. If any symptoms worsen come back to emergency department immediately for repeat evaluation. Thank you. HOW DO I GET A BLOOD PRESSURE MACHINE?  You can buy an electronic home blood pressure machine at your local pharmacy. Insurance will sometimes cover the cost if you have a prescription.  Ask your doctor what type of machine is best for you. There are different machines for your arm and your wrist.  If you decide to buy a machine to check your blood pressure on your arm, first check the size of your arm so you can buy the right size cuff. To check the size of your arm:   Use a measuring tape that shows both inches and centimeters.   Wrap the measuring tape around the upper-middle part of your arm. You may need someone to help you measure.   Write down your arm measurement in both inches and centimeters.   To measure your blood pressure correctly, it is important to have the right size cuff.   If your arm is up to 13 inches (up to 34 centimeters), get an adult cuff size.  If your arm is 13 to 17 inches (35 to 44 centimeters), get a large adult cuff size.    If your arm is 17 to 20 inches (45 to 52 centimeters), get an adult thigh cuff.  WHAT DO THE NUMBERS MEAN?   There are two numbers that make up your blood pressure. For example: 120/80.  The first number (120 in our example) is called the "systolic pressure." It is a measure of the pressure in your blood vessels when your heart is pumping blood.  The second number (80 in our example) is called the "diastolic pressure." It is a measure of the pressure in your blood vessels when your heart is resting between beats.  Your doctor will tell you what your  blood pressure should be. WHAT SHOULD I DO BEFORE I CHECK MY BLOOD PRESSURE?   Try to rest or relax for at least 30 minutes before you check your blood pressure.  Do not smoke.  Do not have any drinks with caffeine, such as:  Soda.  Coffee.  Tea.  Check your blood pressure in a quiet room.  Sit down and stretch out your arm on a table. Keep your arm at about the level of your heart. Let your arm relax.  Make sure that your legs are not crossed. HOW DO I CHECK MY BLOOD PRESSURE?  Follow the directions that came with your machine.  Make sure you remove any tight-fighting clothing from your arm or wrist. Wrap the cuff around your upper arm or wrist. You should be able to fit a finger between the cuff and your arm. If you cannot fit a finger between the cuff and your arm, it is too tight and should be removed and rewrapped.  Some units require you to manually pump up the arm cuff.  Automatic units inflate the cuff when you press a button.  Cuff deflation is automatic in both models.  After the cuff is inflated, the unit measures your blood pressure and pulse. The readings are shown on a monitor. Hold still and breathe normally while the cuff is inflated.  Getting a reading takes less than a minute.  Some models store readings in a memory. Some provide a printout of readings. If your machine does not store your readings, keep a written record.  Take readings with you to your next visit with your doctor. Document Released: 10/04/2008 Document Revised: 03/08/2014 Document Reviewed: 12/17/2013 [facility] Patient Information 2015 [employer], [place]. This information is not intended to replace advice given to you by your health care provider. Make sure you discuss any questions you have with your health care provider. Hypertension Hypertension is another name for high blood pressure. High blood pressure forces your heart to work harder to pump blood. A blood pressure reading has two  numbers, which includes a higher number over a lower number (example: 110/72). HOME CARE   Have your blood pressure rechecked by your doctor.  Only take medicine as told by your doctor. Follow the directions carefully. The medicine does not work as well if you skip doses. Skipping doses also puts you at risk for problems.  Do not smoke.  Monitor your blood pressure at home as told by your doctor. GET HELP IF:  You think you are having a reaction to the medicine you are taking.  You have repeat headaches or feel dizzy.  You have puffiness (swelling) in your ankles.  You have trouble with your vision. GET HELP RIGHT AWAY IF:   You get a very bad headache and are confused.  You feel weak, numb, or faint.  You get chest or belly (abdominal) pain.  You throw up (vomit).  You cannot breathe very well. MAKE SURE YOU:   Understand these instructions.  Will watch your condition.  Will get help right away if you are not doing well or get worse. Document Released: 04/09/2008 Document Revised: 10/27/2013 Document Reviewed: 08/14/2013 [facility] Patient Information 2015 [place], [place]. This information is not intended to replace advice given to you by your health care provider. Make sure you discuss any questions you have with your health care provider.

## 2014-08-26 ENCOUNTER — Encounter: Payer: Self-pay | Admitting: Internal Medicine

## 2014-08-26 ENCOUNTER — Ambulatory Visit (INDEPENDENT_AMBULATORY_CARE_PROVIDER_SITE_OTHER): Payer: BC Managed Care – PPO | Admitting: Internal Medicine

## 2014-08-26 VITALS — BP 120/82 | HR 97 | Temp 98.0°F | Wt 206.0 lb

## 2014-08-26 DIAGNOSIS — Z0189 Encounter for other specified special examinations: Secondary | ICD-10-CM

## 2014-08-26 DIAGNOSIS — Z0000 Encounter for general adult medical examination without abnormal findings: Secondary | ICD-10-CM

## 2014-08-26 DIAGNOSIS — F411 Generalized anxiety disorder: Secondary | ICD-10-CM

## 2014-08-26 DIAGNOSIS — E119 Type 2 diabetes mellitus without complications: Secondary | ICD-10-CM

## 2014-08-26 DIAGNOSIS — R030 Elevated blood-pressure reading, without diagnosis of hypertension: Secondary | ICD-10-CM

## 2014-08-26 MED ORDER — ALPRAZOLAM 0.5 MG PO TABS: 0.5000 mg | ORAL_TABLET | Freq: Every day | ORAL | Status: DC | PRN

## 2014-08-26 NOTE — Progress Notes (Signed)
Pre visit review using our clinic review tool, if applicable. No additional management support is needed unless otherwise documented below in the visit note. 

## 2014-08-26 NOTE — Patient Instructions (Addendum)
Please take all new medication as prescribed - the xanax only as needed  Please continue all other medications as before  Please have the pharmacy call with any other refills you may need.  Please continue your efforts at being more active, low cholesterol diabetic diet, and weight control.  Please keep your appointments with your specialists as you may have planned  Please return in 6 months, or sooner if needed, with Lab testing done 3-5 days before

## 2014-08-26 NOTE — Progress Notes (Signed)
Subjective:    Patient ID: Wayne Diaz, male    DOB: 07/10/1961, 53 y.o.   MRN: [id number]  HPI  Here to f/u after episode marked stress reaction at work where he was involved in an error working with others that cost data and time.  Checked BP after this earlier today at 174/105, wife urged him to be checked.  BP improved here, Pt denies chest pain, increased sob or doe, wheezing, orthopnea, PND, increased LE swelling, palpitations, dizziness or syncope.  Pt denies new neurological symptoms such as new headache, or facial or extremity weakness or numbness   Pt denies polydipsia, polyuria.  Denies worsening depressive symptoms, suicidal ideation, or panic; has ongoing anxiety reactions very infreq usually assoc with work stress.  BP at home usually 120's sbp. Past Medical History  Diagnosis Date  . HYPERLIPIDEMIA 05/05/2008  . GERD 05/05/2008  . DIABETES MELLITUS, TYPE II 04/28/2008  . ASTHMA 05/05/2008  . ALLERGIC RHINITIS 05/05/2008  . SMOKER 03/10/2009  . HYPERSOMNIA 03/10/2009   Past Surgical History  Procedure Laterality Date  . S/p left knee surgury      ACL repair  . Tonsillectomy      reports that he has been smoking Cigarettes.  He has been smoking about 1.00 pack per day. He does not have any smokeless tobacco history on file. He reports that he does not drink alcohol or use illicit drugs. family history includes Arthritis in his other; Diabetes in his other; Heart disease in his other; Hyperlipidemia in his other; Hypertension in his other. No Known Allergies Current Outpatient Prescriptions on File Prior to Visit  Medication Sig Dispense Refill  . aspirin EC 81 MG tablet Take 81 mg by mouth once.      Marland Kitchen. glucose blood (ONE TOUCH ULTRA TEST) test strip Use as directed once daily to check blood sugar.  Diagnosis code 250.00  100 each  11  . Insulin Glargine (LANTUS SOLOSTAR) 100 UNIT/ML Solostar Pen Inject 40 Units into the skin daily after breakfast.      . pravastatin (PRAVACHOL) 40 MG  tablet Take 80 mg by mouth daily.       . sitaGLIPtin-metformin (JANUMET) 50-1000 MG per tablet Take 1 tablet by mouth 2 (two) times daily with a meal.       No current facility-administered medications on file prior to visit.   Review of Systems  Constitutional: Negative for unusual diaphoresis or other sweats  HENT: Negative for ringing in ear Eyes: Negative for double vision or worsening visual disturbance.  Respiratory: Negative for choking and stridor.   Gastrointestinal: Negative for vomiting or other signifcant bowel change Genitourinary: Negative for hematuria or decreased urine volume.  Musculoskeletal: Negative for other MSK pain or swelling Skin: Negative for color change and worsening wound.  Neurological: Negative for tremors and numbness other than noted  Psychiatric/Behavioral: Negative for decreased concentration or agitation other than above       Objective:   Physical Exam BP 120/82  Pulse 97  Temp(Src) 98 F (36.7 C) (Oral)  Wt 206 lb (93.441 kg)  SpO2 95% VS noted, not ill appearing Constitutional: Pt appears well-developed, well-nourished.  HENT: Head: NCAT.  Right Ear: External ear normal.  Left Ear: External ear normal.  Eyes: . Pupils are equal, round, and reactive to light. Conjunctivae and EOM are normal Neck: Normal range of motion. Neck supple.  Cardiovascular: Normal rate and regular rhythm.   Pulmonary/Chest: Effort normal and breath sounds normal.  Abd:  Soft,  NT, ND, + BS Neurological: Pt is alert. Not confused , motor grossly intact, gait normal Skin: Skin is warm. No rash Psychiatric: Pt behavior is normal. slight agitation and tense, mod stressed , not depressed affect    Assessment & Plan:

## 2014-08-27 DIAGNOSIS — R030 Elevated blood-pressure reading, without diagnosis of hypertension: Secondary | ICD-10-CM | POA: Insufficient documentation

## 2014-08-27 NOTE — Assessment & Plan Note (Signed)
No evidence for sustained elev HTN, but we did discuss again the recommendation to at least take low dose ace or arb due to hx of DM and risk of renal adverse outcome, but he declines, trying to minimize med use

## 2014-08-27 NOTE — Assessment & Plan Note (Addendum)
stable overall by history and exam, recent data reviewed with pt, and pt to continue medical treatment as before,  to f/u any worsening symptoms or concerns Lab Results  Component Value Date   HGBA1C 8.7* 03/09/2014   decliens f/u lab at this time

## 2014-08-27 NOTE — Assessment & Plan Note (Signed)
Situational recurrences assoc with stressors leading to neg physical consequences, for occas xanax prn only, limited rx given, declines counseling, declines ssri trial, verified no SI or HI

## 2014-09-09 ENCOUNTER — Ambulatory Visit: Payer: BC Managed Care – PPO | Admitting: Internal Medicine

## 2014-10-05 ENCOUNTER — Other Ambulatory Visit (INDEPENDENT_AMBULATORY_CARE_PROVIDER_SITE_OTHER): Payer: BC Managed Care – PPO

## 2014-10-05 ENCOUNTER — Telehealth: Payer: Self-pay | Admitting: Internal Medicine

## 2014-10-05 DIAGNOSIS — Z0189 Encounter for other specified special examinations: Secondary | ICD-10-CM

## 2014-10-05 DIAGNOSIS — Z0000 Encounter for general adult medical examination without abnormal findings: Secondary | ICD-10-CM

## 2014-10-05 DIAGNOSIS — E119 Type 2 diabetes mellitus without complications: Secondary | ICD-10-CM

## 2014-10-05 DIAGNOSIS — E785 Hyperlipidemia, unspecified: Secondary | ICD-10-CM

## 2014-10-05 LAB — MICROALBUMIN / CREATININE URINE RATIO
CREATININE, U: 156.1 mg/dL
Microalb Creat Ratio: 0.6 mg/g (ref 0.0–30.0)
Microalb, Ur: 0.9 mg/dL (ref 0.0–1.9)

## 2014-10-05 LAB — HEPATIC FUNCTION PANEL
ALT: 25 U/L (ref 0–53)
AST: 18 U/L (ref 0–37)
Albumin: 3.9 g/dL (ref 3.5–5.2)
Alkaline Phosphatase: 54 U/L (ref 39–117)
BILIRUBIN DIRECT: 0.1 mg/dL (ref 0.0–0.3)
Total Bilirubin: 0.6 mg/dL (ref 0.2–1.2)
Total Protein: 6.1 g/dL (ref 6.0–8.3)

## 2014-10-05 LAB — CBC WITH DIFFERENTIAL/PLATELET
BASOS ABS: 0 10*3/uL (ref 0.0–0.1)
Basophils Relative: 0.5 % (ref 0.0–3.0)
EOS ABS: 0.1 10*3/uL (ref 0.0–0.7)
Eosinophils Relative: 1.6 % (ref 0.0–5.0)
HEMATOCRIT: 48.7 % (ref 39.0–52.0)
HEMOGLOBIN: 16.3 g/dL (ref 13.0–17.0)
LYMPHS ABS: 2.3 10*3/uL (ref 0.7–4.0)
Lymphocytes Relative: 26.4 % (ref 12.0–46.0)
MCHC: 33.4 g/dL (ref 30.0–36.0)
MCV: 89.7 fl (ref 78.0–100.0)
MONO ABS: 0.5 10*3/uL (ref 0.1–1.0)
Monocytes Relative: 5.5 % (ref 3.0–12.0)
NEUTROS ABS: 5.8 10*3/uL (ref 1.4–7.7)
Neutrophils Relative %: 66 % (ref 43.0–77.0)
PLATELETS: 127 10*3/uL — AB (ref 150.0–400.0)
RBC: 5.43 Mil/uL (ref 4.22–5.81)
RDW: 13 % (ref 11.5–15.5)
WBC: 8.8 10*3/uL (ref 4.0–10.5)

## 2014-10-05 LAB — BASIC METABOLIC PANEL
BUN: 20 mg/dL (ref 6–23)
CALCIUM: 9.3 mg/dL (ref 8.4–10.5)
CO2: 25 mEq/L (ref 19–32)
Chloride: 104 mEq/L (ref 96–112)
Creatinine, Ser: 0.9 mg/dL (ref 0.4–1.5)
GFR: 88.69 mL/min (ref >60.00)
Glucose, Bld: 212 mg/dL — ABNORMAL HIGH (ref 70–99)
Potassium: 4.5 mEq/L (ref 3.5–5.1)
SODIUM: 138 meq/L (ref 135–145)

## 2014-10-05 LAB — URINALYSIS, ROUTINE W REFLEX MICROSCOPIC
BILIRUBIN URINE: NEGATIVE
Hgb urine dipstick: NEGATIVE
Ketones, ur: NEGATIVE
LEUKOCYTES UA: NEGATIVE
Nitrite: NEGATIVE
PH: 6 (ref 5.0–8.0)
Total Protein, Urine: NEGATIVE
Urine Glucose: >=1000 — AB
Urobilinogen, UA: 0.2 (ref 0.0–1.0)

## 2014-10-05 LAB — LIPID PANEL
Cholesterol: 167 mg/dL (ref 0–200)
HDL: 26.8 mg/dL — AB (ref >39.00)
NonHDL: 140.2
Total CHOL/HDL Ratio: 6
Triglycerides: 290 mg/dL — ABNORMAL HIGH (ref 0.0–149.0)
VLDL: 58 mg/dL — AB (ref 0.0–40.0)

## 2014-10-05 LAB — TSH: TSH: 2.44 u[IU]/mL (ref 0.35–4.50)

## 2014-10-05 LAB — LDL CHOLESTEROL, DIRECT: Direct LDL: 94.6 mg/dL

## 2014-10-05 LAB — HEMOGLOBIN A1C: Hgb A1c MFr Bld: 9.4 % — ABNORMAL HIGH (ref 4.6–6.5)

## 2014-10-05 LAB — PSA: PSA: 0.3 ng/mL (ref 0.10–4.00)

## 2014-10-05 NOTE — Telephone Encounter (Signed)
Ok with me if Ok with [facility], thanks

## 2014-10-05 NOTE — Telephone Encounter (Signed)
Patient is requesting to transfer from [facility] to [name].  Please advise.

## 2014-10-05 NOTE — Telephone Encounter (Signed)
Ok with me 

## 2014-10-05 NOTE — Telephone Encounter (Signed)
Scheduled with [facility]

## 2014-10-07 ENCOUNTER — Ambulatory Visit (INDEPENDENT_AMBULATORY_CARE_PROVIDER_SITE_OTHER): Payer: BC Managed Care – PPO | Admitting: Family

## 2014-10-07 ENCOUNTER — Encounter: Payer: Self-pay | Admitting: Family

## 2014-10-07 VITALS — BP 138/82 | HR 95 | Temp 98.1°F | Resp 18 | Ht 69.0 in | Wt 209.8 lb

## 2014-10-07 DIAGNOSIS — E119 Type 2 diabetes mellitus without complications: Secondary | ICD-10-CM

## 2014-10-07 DIAGNOSIS — E785 Hyperlipidemia, unspecified: Secondary | ICD-10-CM

## 2014-10-07 MED ORDER — SITAGLIP PHOS-METFORMIN HCL ER 100-1000 MG PO TB24: 1.0000 | ORAL_TABLET | Freq: Every day | ORAL | Status: DC

## 2014-10-07 NOTE — Progress Notes (Signed)
Pre visit review using our clinic review tool, if applicable. No additional management support is needed unless otherwise documented below in the visit note. 

## 2014-10-07 NOTE — Assessment & Plan Note (Signed)
Cholesterol remains stable at this point. Continue Pravachol at current dose. Kidney function appropriate willl check liver function at next appointment.Follow up in about 6 months.

## 2014-10-07 NOTE — Assessment & Plan Note (Signed)
Diabetes remains uncontrolled with an A1c of 9.4. Discussed options with patient on increasing medication or adding additional medication. Patient wishes to increase Janumet. Continue with Lantus at 40 units daily. Foot exam completed today and within normal limits. Patient will schedule diabetic eye exam with Dr. Liana CrockerHegar. Follow up in about 3 months.

## 2014-10-07 NOTE — Patient Instructions (Signed)
Thank you for choosing [facility].  Summary/Instructions:  Your prescription(s) have been submitted to your pharmacy. Please take as directed and contact our office if you believe you are having problem(s) with the medication(s).  If your symptoms worsen or fail to improve, please contact our office for further instruction, or in case of emergency go directly to the emergency room at the closest medical facility.   Please schedule an eye exam with Dr. Melton AlarHagger.  Start taking increased Janumet and continue with the Lantus.   Try to eat some more fish to help with the good cholesterol.

## 2014-10-07 NOTE — Progress Notes (Signed)
   Subjective:    Patient ID: Wayne Diaz, male    DOB: 05/13/1961, 53 y.o.   MRN: [id number]  Chief Complaint  Patient presents with  . Establish Care    would like blood work, not fasting, stress    HPI:  Wayne Diaz is a 53 y.o. male who presents today to establish care and review blood work.   1) Diabetes - Currently maintained on 40 U of lantus daily and Janumet. Due for diabetic eye exam (Dr. Melton AlarHagger). Due for foot exam. Denies changes in vision, numbness or tingling, excessive hunger, or excessive thirst.  Lab Results  Component Value Date   HGBA1C 9.4* 10/05/2014     Chemistry      Component Value Date/Time   NA 138 10/05/2014 0853   K 4.5 10/05/2014 0853   CL 104 10/05/2014 0853   CO2 25 10/05/2014 0853   BUN 20 10/05/2014 0853   CREATININE 0.9 10/05/2014 0853      Component Value Date/Time   CALCIUM 9.3 10/05/2014 0853   ALKPHOS 54 10/05/2014 0853   AST 18 10/05/2014 0853   ALT 25 10/05/2014 0853   BILITOT 0.6 10/05/2014 0853     2) Hyperlipidemia - Maintained on pravastatin. Denies any muscle pain.   Lab Results  Component Value Date   CHOL 167 10/05/2014   HDL 26.80* 10/05/2014   LDLCALC 55 03/09/2014   LDLDIRECT 94.6 10/05/2014   TRIG 290.0* 10/05/2014   CHOLHDL 6 10/05/2014   No Known Allergies  Current Outpatient Prescriptions on File Prior to Visit  Medication Sig Dispense Refill  . ALPRAZolam (XANAX) 0.5 MG tablet Take 1 tablet (0.5 mg total) by mouth daily as needed for anxiety. 30 tablet 1  . glucose blood (ONE TOUCH ULTRA TEST) test strip Use as directed once daily to check blood sugar.  Diagnosis code 250.00 100 each 11  . Insulin Glargine (LANTUS SOLOSTAR) 100 UNIT/ML Solostar Pen Inject 40 Units into the skin daily after breakfast.    . pravastatin (PRAVACHOL) 40 MG tablet Take 80 mg by mouth daily.      No current facility-administered medications on file prior to visit.    Review of Systems    See HPI Objective:    BP 138/82 mmHg   Pulse 95  Temp(Src) 98.1 F (36.7 C) (Oral)  Resp 18  Ht 5\' 9"  (1.753 m)  Wt 209 lb 12.8 oz (95.165 kg)  BMI 30.97 kg/m2  SpO2 96% Nursing note and vital signs reviewed.  Physical Exam  Constitutional: He is oriented to person, place, and time. He appears well-developed and well-nourished. No distress.  Cardiovascular: Normal rate, regular rhythm, normal heart sounds and intact distal pulses.   Pulmonary/Chest: Effort normal and breath sounds normal.  Musculoskeletal:  Diabetic foot exam performed. No evidence of skin breakdown. Pulses are intact and appropriate. Sensation intact to monofilament.  Neurological: He is alert and oriented to person, place, and time.  Skin: Skin is warm and dry.  Psychiatric: He has a normal mood and affect. His behavior is normal. Judgment and thought content normal.       Assessment & Plan:

## 2014-11-14 NOTE — Addendum Note (Signed)
Addended by: JOHN, JAMES W on: 11/14/2014 06:37 PM   Modules accepted: SmartSet  

## 2014-12-07 ENCOUNTER — Ambulatory Visit (INDEPENDENT_AMBULATORY_CARE_PROVIDER_SITE_OTHER): Payer: BLUE CROSS/BLUE SHIELD | Admitting: Internal Medicine

## 2014-12-07 ENCOUNTER — Encounter: Payer: Self-pay | Admitting: Internal Medicine

## 2014-12-07 VITALS — BP 138/80 | HR 98 | Temp 98.5°F | Ht 69.0 in | Wt 207.0 lb

## 2014-12-07 DIAGNOSIS — J0100 Acute maxillary sinusitis, unspecified: Secondary | ICD-10-CM

## 2014-12-07 MED ORDER — AMOXICILLIN 500 MG PO CAPS: 500.0000 mg | ORAL_CAPSULE | Freq: Three times a day (TID) | ORAL | Status: DC

## 2014-12-07 NOTE — Progress Notes (Signed)
Pre visit review using our clinic review tool, if applicable. No additional management support is needed unless otherwise documented below in the visit note. 

## 2014-12-07 NOTE — Progress Notes (Signed)
   Subjective:    Patient ID: Wayne Diaz, male    DOB: 03/16/1961, 54 y.o.   MRN: [id number]  HPI Symptoms began 1.5 weeks ago as pressure in the left orbit/retro-orbital area.  As of yesterday he's developed sore throat on the left.  He describes congestion /obstruction of the left nostril ; left maxillary sinus pain; and pressure in the left ear. He also has a dry cough.  He has no extrinsic symptoms or other upper respiratory tract infection symptoms.  His daughter has been ill recently.  He smokes a pack a day and has no interest in stopping. Risk were discussed.  He also declines flu shot.  Review of Systems He denies fever, chills, sweats, dental pain, wheezing, or shortness of breath. He has a chronic smoker's cough.    Objective:   Physical Exam  Pertinent or positive findings include: Extraocular motion and vision is intact. There is no clinical suggestion of orbital cellulitis. Nares are erythematous and edematous without exudate.  General appearance:Adequately nourished; no acute distress or increased work of breathing is present.  No  lymphadenopathy about the head, neck, or axilla noted. Eyes: No conjunctival inflammation or lid edema is present. There is no scleral icterus. Ears:  External ear exam shows no significant lesions or deformities.  Otoscopic examination reveals clear canals, tympanic membranes are intact bilaterally without bulging, retraction, inflammation or discharge. Nose:  External nasal examination shows no deformity or inflammation.No septal dislocation or deviation.No obstruction to airflow.  Oral exam: Dental hygiene is good; lips and gums are healthy appearing.There is no oropharyngeal erythema or exudate noted.  Neck:  No deformities, thyromegaly, masses, or tenderness noted.   Supple with full range of motion without pain.  Heart:  Normal rate and regular rhythm. S1 and S2 normal without gallop, murmur, click, rub or other extra sounds.    Lungs:Chest clear to auscultation; no wheezes, rhonchi,rales ,or rubs present. Extremities:  No cyanosis, edema, or clubbing  noted  Skin: Warm & dry w/o jaundice or tenting.       Assessment & Plan:  #1 rhinosinusitis without significant bronchitis  Plan: Nasal hygiene interventions discussed. See prescription medications

## 2014-12-07 NOTE — Patient Instructions (Signed)

## 2014-12-28 ENCOUNTER — Encounter: Payer: BC Managed Care – PPO | Admitting: Internal Medicine

## 2015-03-08 ENCOUNTER — Other Ambulatory Visit (INDEPENDENT_AMBULATORY_CARE_PROVIDER_SITE_OTHER): Payer: BLUE CROSS/BLUE SHIELD

## 2015-03-08 DIAGNOSIS — Z0189 Encounter for other specified special examinations: Secondary | ICD-10-CM | POA: Diagnosis not present

## 2015-03-08 DIAGNOSIS — Z0000 Encounter for general adult medical examination without abnormal findings: Secondary | ICD-10-CM

## 2015-03-08 DIAGNOSIS — E119 Type 2 diabetes mellitus without complications: Secondary | ICD-10-CM | POA: Diagnosis not present

## 2015-03-08 DIAGNOSIS — R7989 Other specified abnormal findings of blood chemistry: Secondary | ICD-10-CM | POA: Diagnosis not present

## 2015-03-08 LAB — LIPID PANEL
CHOL/HDL RATIO: 5
Cholesterol: 156 mg/dL (ref 0–200)
HDL: 30.4 mg/dL — ABNORMAL LOW (ref >39.00)
NonHDL: 125.6
TRIGLYCERIDES: 301 mg/dL — AB (ref 0.0–149.0)
VLDL: 60.2 mg/dL — AB (ref 0.0–40.0)

## 2015-03-08 LAB — HEPATIC FUNCTION PANEL
ALK PHOS: 50 U/L (ref 39–117)
ALT: 19 U/L (ref 0–53)
AST: 15 U/L (ref 0–37)
Albumin: 3.8 g/dL (ref 3.5–5.2)
BILIRUBIN TOTAL: 0.4 mg/dL (ref 0.2–1.2)
Bilirubin, Direct: 0.1 mg/dL (ref 0.0–0.3)
Total Protein: 6.5 g/dL (ref 6.0–8.3)

## 2015-03-08 LAB — BASIC METABOLIC PANEL
BUN: 18 mg/dL (ref 6–23)
CALCIUM: 9.3 mg/dL (ref 8.4–10.5)
CO2: 28 mEq/L (ref 19–32)
CREATININE: 1 mg/dL (ref 0.40–1.50)
Chloride: 103 mEq/L (ref 96–112)
GFR: 82.45 mL/min (ref >60.00)
Glucose, Bld: 164 mg/dL — ABNORMAL HIGH (ref 70–99)
Potassium: 4.2 mEq/L (ref 3.5–5.1)
Sodium: 138 mEq/L (ref 135–145)

## 2015-03-08 LAB — LDL CHOLESTEROL, DIRECT: LDL DIRECT: 79 mg/dL

## 2015-03-08 LAB — HEMOGLOBIN A1C: Hgb A1c MFr Bld: 9.2 % — ABNORMAL HIGH (ref 4.6–6.5)

## 2015-03-11 ENCOUNTER — Ambulatory Visit (INDEPENDENT_AMBULATORY_CARE_PROVIDER_SITE_OTHER): Payer: BLUE CROSS/BLUE SHIELD | Admitting: Family

## 2015-03-11 ENCOUNTER — Encounter: Payer: Self-pay | Admitting: Family

## 2015-03-11 VITALS — BP 138/78 | HR 96 | Temp 98.2°F | Resp 18 | Ht 69.0 in | Wt 204.0 lb

## 2015-03-11 DIAGNOSIS — Z23 Encounter for immunization: Secondary | ICD-10-CM

## 2015-03-11 DIAGNOSIS — E119 Type 2 diabetes mellitus without complications: Secondary | ICD-10-CM | POA: Diagnosis not present

## 2015-03-11 DIAGNOSIS — E785 Hyperlipidemia, unspecified: Secondary | ICD-10-CM | POA: Diagnosis not present

## 2015-03-11 MED ORDER — TADALAFIL 10 MG PO TABS: 10.0000 mg | ORAL_TABLET | Freq: Every day | ORAL | Status: DC | PRN

## 2015-03-11 MED ORDER — PRAVASTATIN SODIUM 40 MG PO TABS: 80.0000 mg | ORAL_TABLET | Freq: Every day | ORAL | Status: DC

## 2015-03-11 MED ORDER — SITAGLIP PHOS-METFORMIN HCL ER 100-1000 MG PO TB24: 1.0000 | ORAL_TABLET | Freq: Every day | ORAL | Status: DC

## 2015-03-11 MED ORDER — GLUCOSE BLOOD VI STRP: ORAL_STRIP | Status: DC

## 2015-03-11 MED ORDER — INSULIN GLARGINE 100 UNIT/ML SOLOSTAR PEN: 40.0000 [IU] | PEN_INJECTOR | Freq: Every day | SUBCUTANEOUS | Status: DC

## 2015-03-11 NOTE — Progress Notes (Signed)
   Subjective:    Patient ID: Wayne Diaz, male    DOB: 03/15/1961, 54 y.o.   MRN: [id number]  Chief Complaint  Patient presents with  . Follow-up    blood sugar usually runs between 170-210 in the mornings when he wakes up, forgets to take night time pill    HPI:  Wayne Diaz is a 54 y.o. male who presents today for an office follow up.  1.) Diabetes - Previously uncontrolled Type 2 diabetes with an A1c of 9.2 which is down from 9.4. Current regimen includes 40 Units of Lantus and Metformin 1,000 mg. Indicates that he has not yet started the Janumet. Takes the other medications as prescribed and denies any adverse effects. Denies any incidence of hypoglycemia. Takes his blood sugar 3-4 days per week in the morning and the average range is between 170-210. Reports that he does not eat too much of anything but has room in his diet and exercise to improve.   Lab Results  Component Value Date   HGBA1C 9.2* 03/08/2015    No Known Allergies  Current Outpatient Prescriptions on File Prior to Visit  Medication Sig Dispense Refill  . glucose blood (ONE TOUCH ULTRA TEST) test strip Use as directed once daily to check blood sugar.  Diagnosis code 250.00 100 each 11  . Insulin Glargine (LANTUS SOLOSTAR) 100 UNIT/ML Solostar Pen Inject 40 Units into the skin daily after breakfast.    . pravastatin (PRAVACHOL) 40 MG tablet Take 80 mg by mouth daily.     . SitaGLIPtin-MetFORMIN HCl [phone] MG TB24 Take 1 tablet by mouth daily. 30 tablet 0   No current facility-administered medications on file prior to visit.    Review of Systems  Eyes:       Negative for changes in vision.   Respiratory: Negative for chest tightness and shortness of breath.   Neurological: Negative for numbness.      Objective:    BP 138/78 mmHg  Pulse 96  Temp(Src) 98.2 F (36.8 C) (Oral)  Resp 18  Ht 5\' 9"  (1.753 m)  Wt 204 lb (92.534 kg)  BMI 30.11 kg/m2  SpO2 95% Nursing note and vital signs  reviewed.  Physical Exam  Constitutional: He is oriented to person, place, and time. He appears well-developed and well-nourished. No distress.  Cardiovascular: Normal rate, regular rhythm, normal heart sounds and intact distal pulses.   Pulmonary/Chest: Effort normal and breath sounds normal.  Neurological: He is alert and oriented to person, place, and time.  Skin: Skin is warm and dry.  Psychiatric: He has a normal mood and affect. His behavior is normal. Judgment and thought content normal.       Assessment & Plan:

## 2015-03-11 NOTE — Patient Instructions (Signed)
Thank you for choosing [facility].  Summary/Instructions:  Please start taking the Janumet daily. Please increase your Lantus to 46 units daily.   Your prescription(s) have been submitted to your pharmacy or been printed and provided for you. Please take as directed and contact our office if you believe you are having problem(s) with the medication(s) or have any questions.  If your symptoms worsen or fail to improve, please contact our office for further instruction, or in case of emergency go directly to the emergency room at the closest medical facility.

## 2015-03-11 NOTE — Assessment & Plan Note (Signed)
Diabetes remains uncontrolled during the past 3 months as patient did not start the Janumet. Discussed with patient importance of decreasing his blood sugars. Increase Lantus to 46 Units per night and start Janumet. If too expensive, will continue metformin and add Trajenta to current regimen. Discuss potential need for multiple insulins if this is unsuccessful. Note: Patient is about to be laid off of his job and is currently seeking new employment. Follow up in 3 months.

## 2015-03-11 NOTE — Progress Notes (Signed)
Pre visit review using our clinic review tool, if applicable. No additional management support is needed unless otherwise documented below in the visit note. 

## 2015-03-11 NOTE — Assessment & Plan Note (Signed)
Lab work results indicate HDL below goal of 39. Discussed with patient sources of omega-3 fatty acids into his diet to assist with increasing HDL. Continue current dosage of pravastatin.

## 2015-04-26 ENCOUNTER — Telehealth: Payer: Self-pay | Admitting: *Deleted

## 2015-04-26 NOTE — Telephone Encounter (Signed)
Patient has appt on 06/10/15 will have his a1c rechecked then.

## 2015-05-02 ENCOUNTER — Other Ambulatory Visit: Payer: Self-pay

## 2015-05-13 ENCOUNTER — Other Ambulatory Visit: Payer: Self-pay | Admitting: Family

## 2015-05-17 IMAGING — CT CT HEAD W/O CM
3 of 5 series · 16 of 47 positions shown, 19 images · non-contrast
Comparison: None.

CLINICAL DATA: Recent traumatic injury

EXAM:
CT HEAD WITHOUT CONTRAST
CT MAXILLOFACIAL WITHOUT CONTRAST
TECHNIQUE: Multidetector CT imaging of the head and maxillofacial structures
were performed using the standard protocol without intravenous
contrast. Multiplanar CT image reconstructions of the maxillofacial
structures were also generated.

[Series 4: facial bones · axial · 0.40mm/px · z∈[+36,+184]mm · 10 of 86 slices shown, 13 images]
[im 6/86  brain]
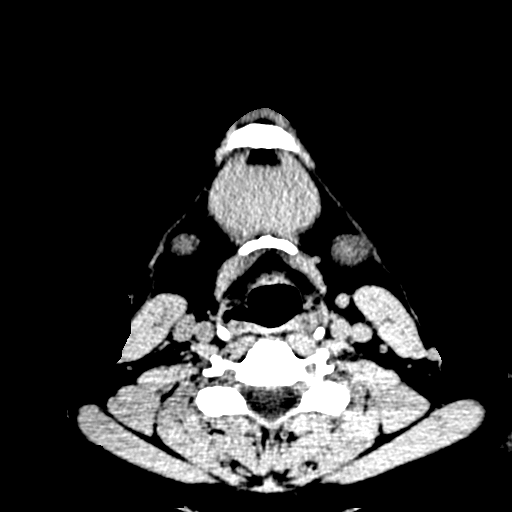
[im 6/86  bone]
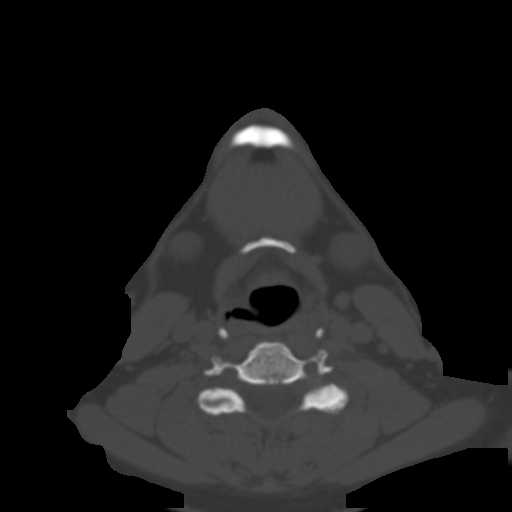
[im 16/86  brain]
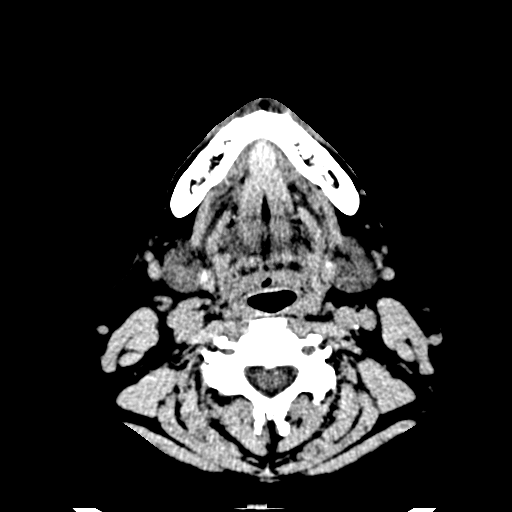
[im 22/86  brain]
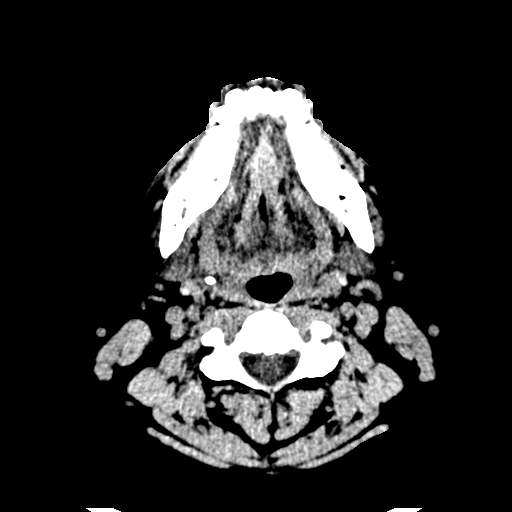
[im 32/86  brain]
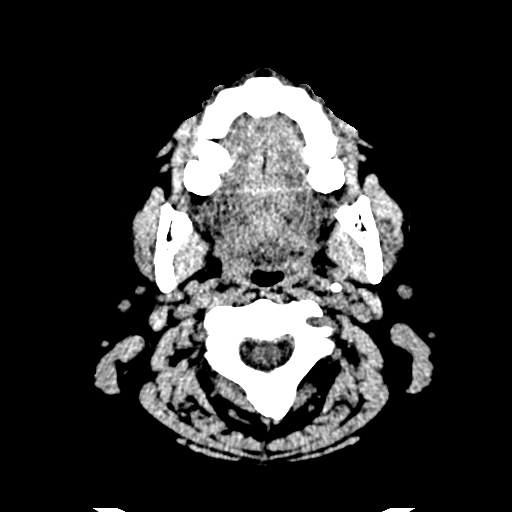
[im 38/86  brain]
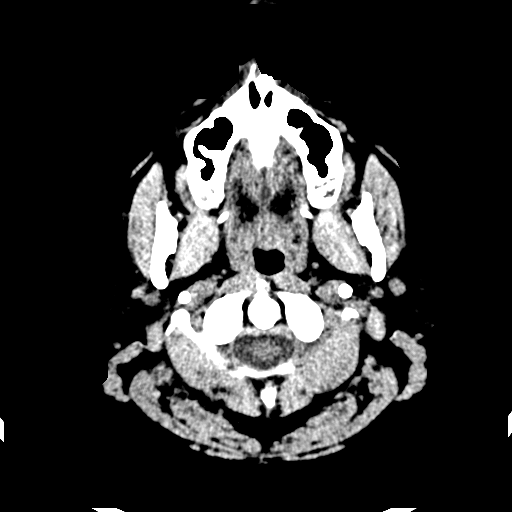
[im 38/86  bone]
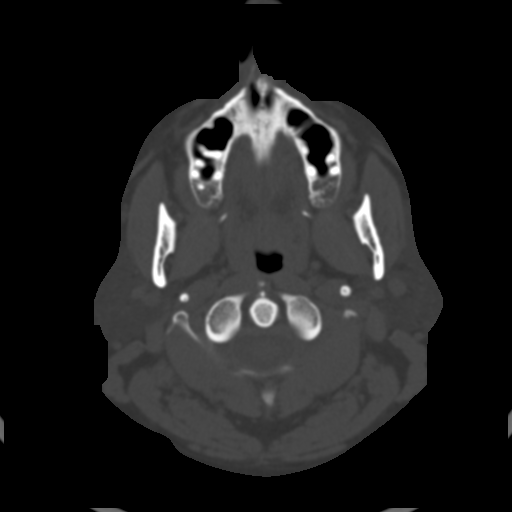
[im 48/86  brain]
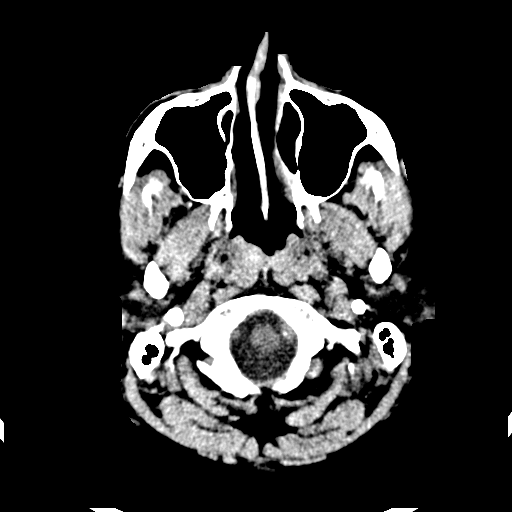
[im 54/86  brain]
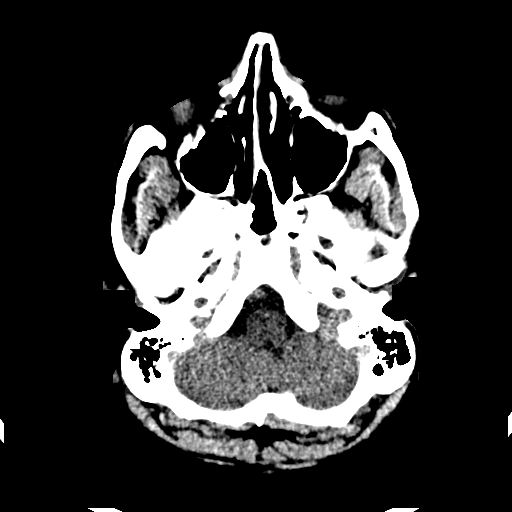
[im 64/86  brain]
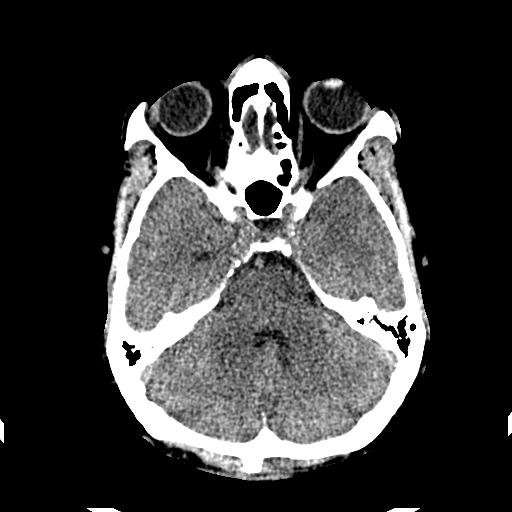
[im 70/86  brain]
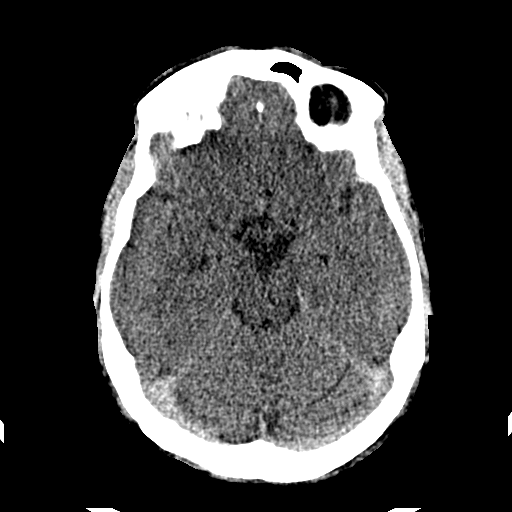
[im 70/86  bone]
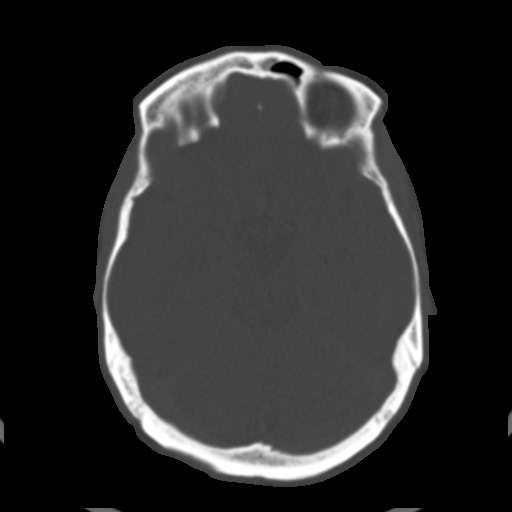
[im 80/86  brain]
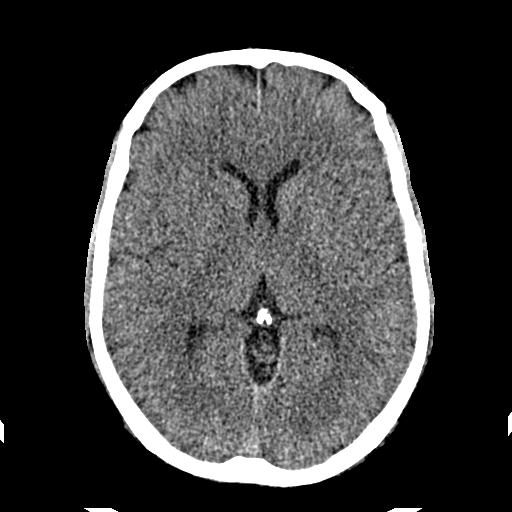

[mpr, coronal std, coronal · coronal · 0.40mm/px · 3 of 63 slices shown]
[im 21/63  brain]
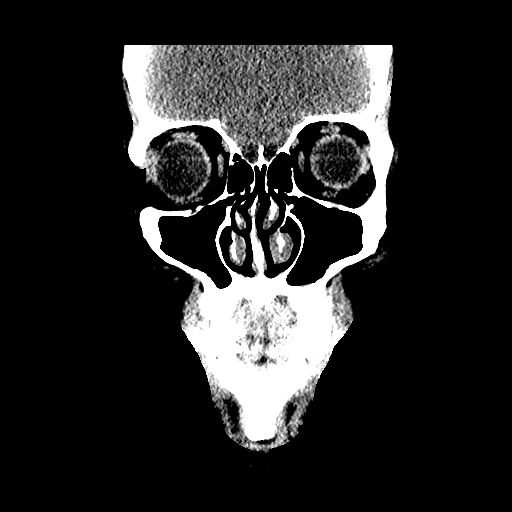
[im 28/63  brain]
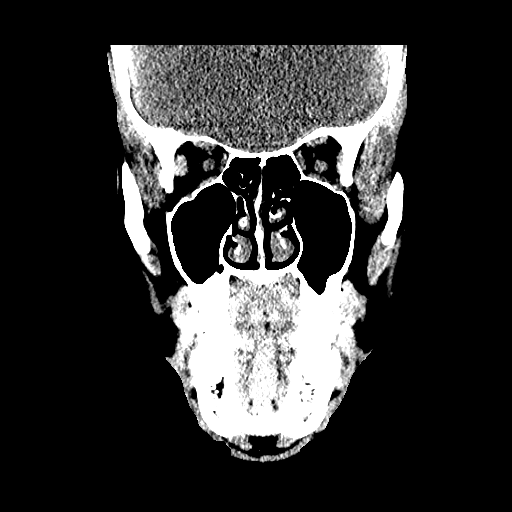
[im 35/63  brain]
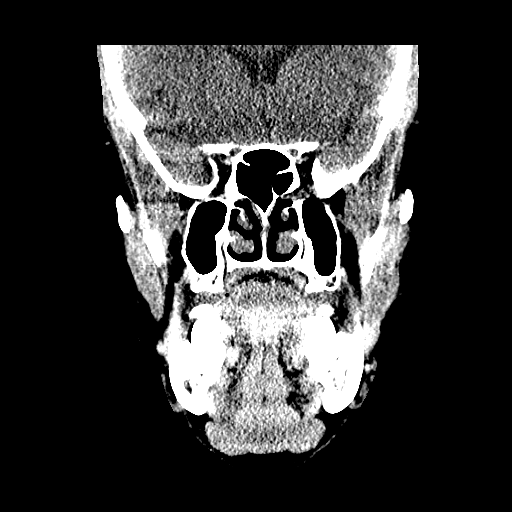

[mpr, sagittal std, sagittal · sagittal · 0.40mm/px · 3 of 66 slices shown]
[im 22/66  brain]
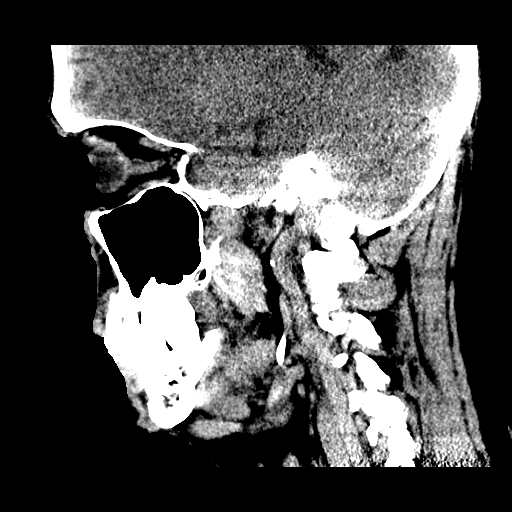
[im 33/66  brain]
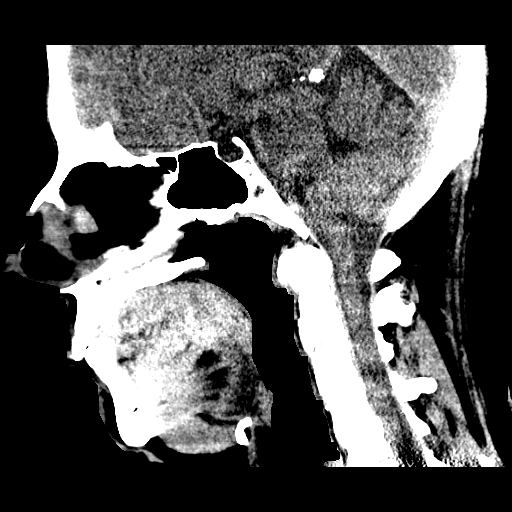
[im 44/66  brain]
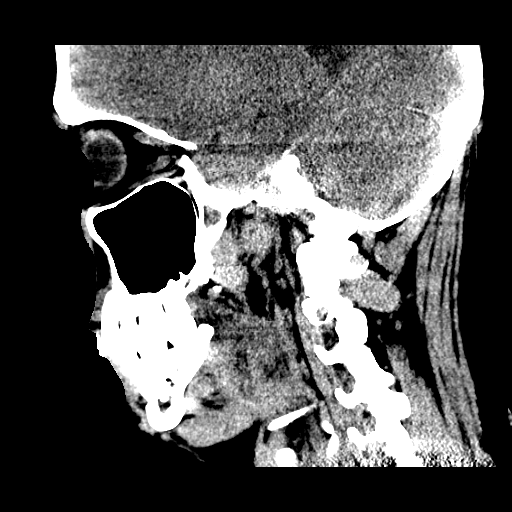

[16 of 47 positions shown; findings below may reference images not displayed]

FINDINGS: CT HEAD FINDINGS

The bony calvarium is intact. No gross soft tissue abnormality is
noted. The ventricles are normal in configuration. No findings to
suggest acute hemorrhage, acute infarction or space-occupying mass
lesion are noted.

CT MAXILLOFACIAL FINDINGS

Degenerative changes of the cervical spine are noted. The bony
structures of the face are within normal limits. No acute fracture
is seen. The surrounding soft tissues are within normal limits. No
focal mass lesion is noted. The orbits and their contents are within
normal limits. Skull base and its contents are unremarkable.
IMPRESSION: CT of the head:

Unremarkable CT of the head.

CT of the maxillofacial bones:

Unremarkable CT of the maxillofacial bones.

## 2015-06-03 ENCOUNTER — Encounter: Payer: Self-pay | Admitting: Gastroenterology

## 2015-06-10 ENCOUNTER — Ambulatory Visit: Payer: BLUE CROSS/BLUE SHIELD | Admitting: Family

## 2015-06-10 ENCOUNTER — Telehealth: Payer: Self-pay | Admitting: Family

## 2015-06-10 DIAGNOSIS — Z0289 Encounter for other administrative examinations: Secondary | ICD-10-CM

## 2015-06-10 NOTE — Telephone Encounter (Signed)
Patient no showed for three month fu.  Please advised.

## 2015-06-14 ENCOUNTER — Telehealth: Payer: Self-pay | Admitting: Family

## 2015-06-14 NOTE — Telephone Encounter (Signed)
Tried to contact patient.  Patient does not have voice mail.

## 2015-06-14 NOTE — Telephone Encounter (Signed)
Ok to reschedule

## 2015-06-14 NOTE — Telephone Encounter (Signed)
Error

## 2015-06-27 ENCOUNTER — Other Ambulatory Visit: Payer: Self-pay | Admitting: Family

## 2015-08-15 ENCOUNTER — Telehealth: Payer: Self-pay | Admitting: *Deleted

## 2015-08-15 MED ORDER — PRAVASTATIN SODIUM 40 MG PO TABS: 80.0000 mg | ORAL_TABLET | Freq: Every day | ORAL | Status: DC

## 2015-08-15 NOTE — Telephone Encounter (Signed)
Left msg on triage needing refill sent on his Pravastain.sent rx to [facility].../lmb

## 2015-08-19 ENCOUNTER — Telehealth: Payer: Self-pay | Admitting: *Deleted

## 2015-08-19 MED ORDER — PRAVASTATIN SODIUM 80 MG PO TABS: 80.0000 mg | ORAL_TABLET | Freq: Every day | ORAL | Status: DC

## 2015-08-19 NOTE — Telephone Encounter (Signed)
Receive call pt wife states checking to see if greg change pravastatin he been take pravastatin 40mg  2 pill day. Inform wife per chart refill was sent back to [facility] for Pravastatin 40 mg (2) daily.  Pharmacy told them he can only take 1 1/2 due to insurance. Explain to the wife sometimes the insurance does not like to cover the second pill. I will send a rx in for pravastatin 80 mg but he only take 1 day....Raechel Chute/lmb

## 2015-10-10 ENCOUNTER — Telehealth: Payer: Self-pay | Admitting: Family

## 2015-10-10 DIAGNOSIS — Z794 Long term (current) use of insulin: Principal | ICD-10-CM

## 2015-10-10 DIAGNOSIS — E119 Type 2 diabetes mellitus without complications: Secondary | ICD-10-CM

## 2015-10-10 NOTE — Telephone Encounter (Signed)
Patient wife is calling to request a referral to Endo preferably dr Everardo Allellison. Thank you.

## 2015-10-10 NOTE — Telephone Encounter (Signed)
Please inform patient referral placed

## 2015-10-11 NOTE — Telephone Encounter (Signed)
Called and advise ?

## 2015-10-17 ENCOUNTER — Encounter: Payer: Self-pay | Admitting: Endocrinology

## 2015-10-17 ENCOUNTER — Ambulatory Visit (INDEPENDENT_AMBULATORY_CARE_PROVIDER_SITE_OTHER): Payer: BLUE CROSS/BLUE SHIELD | Admitting: Endocrinology

## 2015-10-17 VITALS — BP 132/84 | HR 97 | Temp 98.2°F | Ht 69.0 in | Wt 196.0 lb

## 2015-10-17 DIAGNOSIS — Z794 Long term (current) use of insulin: Secondary | ICD-10-CM | POA: Diagnosis not present

## 2015-10-17 DIAGNOSIS — E119 Type 2 diabetes mellitus without complications: Secondary | ICD-10-CM | POA: Diagnosis not present

## 2015-10-17 LAB — POCT GLYCOSYLATED HEMOGLOBIN (HGB A1C): HEMOGLOBIN A1C: 8.5

## 2015-10-17 MED ORDER — INSULIN GLARGINE 100 UNIT/ML SOLOSTAR PEN: 55.0000 [IU] | PEN_INJECTOR | SUBCUTANEOUS | Status: DC

## 2015-10-17 MED ORDER — GLUCOSE BLOOD VI STRP: 1.0000 | ORAL_STRIP | Freq: Two times a day (BID) | Status: DC

## 2015-10-17 NOTE — Patient Instructions (Addendum)
good diet and exercise significantly improve the control of your diabetes.  please let me know if you wish to be referred to a dietician.  high blood sugar is very risky to your health.  you should see an eye doctor and dentist every year.  It is very important to get all recommended vaccinations.  controlling your blood pressure and cholesterol drastically reduces the damage diabetes does to your body.  Those who smoke should quit.  please discuss these with your doctor.  check your blood sugar twice a day.  vary the time of day when you check, between before the 3 meals, and at bedtime.  also check if you have symptoms of your blood sugar being too high or too low.  please keep a record of the readings and bring it to your next appointment here (or you can bring the meter itself).  You can write it on any piece of paper.  please call us sooner if your blood sugar goes below 70, or if you have a lot of readings over 200. For now, please stop taking the janumet, and:  Increase the lantus to 55 units each morning.   Please come back for a follow-up appointment in 1 month.

## 2015-10-17 NOTE — Progress Notes (Signed)
Subjective:    Patient ID: Wayne Diaz, male    DOB: 08-Dec-1960, 54 y.o.   MRN: [id number]  HPI pt states DM was dx'ed in 2002; he has mild if any neuropathy of the lower extremities; he is unaware of any associated chronic complications; he has been on insulin since 2010; pt says his diet and exercise are pretty good; he has never had pancreatitis, severe hypoglycemia or DKA.  He will lose his insurance on 12/06/15.  He check cbg's in the fasting state (mid-100's).  He denies hypoglycemia.   Past Medical History  Diagnosis Date  . HYPERLIPIDEMIA 05/05/2008  . GERD 05/05/2008  . DIABETES MELLITUS, TYPE II 04/28/2008  . ASTHMA 05/05/2008  . ALLERGIC RHINITIS 05/05/2008  . SMOKER 03/10/2009  . HYPERSOMNIA 03/10/2009    Past Surgical History  Procedure Laterality Date  . S/p left knee surgury      ACL repair  . Tonsillectomy      Social History   Social History  . Marital Status: Married    Spouse Name: N/A  . Number of Children: 4  . Years of Education: 16   Occupational History  . banking business    Social History Main Topics  . Smoking status: Current Every Day Smoker -- 1.00 packs/day for 30 years    Types: Cigarettes  . Smokeless tobacco: Never Used  . Alcohol Use: No  . Drug Use: No  . Sexual Activity: Not on file   Other Topics Concern  . Not on file   Social History Narrative   Born and raised in [place], [place]. [place]ised [place]. Currently resides in house with his wife and daughter. Fun: Yard work, baseball.   Denies religious beliefs effecting health care.     Current Outpatient Prescriptions on File Prior to Visit  Medication Sig Dispense Refill  . pravastatin (PRAVACHOL) 40 MG tablet Take 2 tablets (80 mg total) by mouth daily. 180 tablet 1  . pravastatin (PRAVACHOL) 80 MG tablet Take 1 tablet (80 mg total) by mouth daily. D/C THE RX FOR 40 MG 90 tablet 3  . tadalafil (CIALIS) 10 MG tablet Take 1 tablet (10 mg total) by mouth daily as needed for erectile  dysfunction. 10 tablet 0   No current facility-administered medications on file prior to visit.    No Known Allergies  Family History  Problem Relation Age of Onset  . Arthritis Other   . Hyperlipidemia Other   . Heart disease Other   . Hypertension Other   . Diabetes Other     BP 132/84 mmHg  Pulse 97  Temp(Src) 98.2 F (36.8 C) (Oral)  Ht  (1.753 m)  Wt 196 lb (88.905 kg)  BMI 28.93 kg/m2  SpO2 97%   Review of Systems denies blurry vision, headache, chest pain, sob, n/v, urinary frequency, muscle cramps, excessive diaphoresis, cold intolerance, rhinorrhea, and easy bruising.  He has lost a few lbs.  He has anxiety.     Objective:   Physical Exam VS: see vs page GEN: no distress HEAD: head: no deformity eyes: no periorbital swelling, no proptosis external nose and ears are normal mouth: no lesion seen NECK: supple, thyroid is not enlarged.   CHEST WALL: no deformity LUNGS: clear to auscultation BREASTS:  No gynecomastia CV: reg rate and rhythm, no murmur ABD: abdomen is soft, nontender.  no hepatosplenomegaly.  not distended.  no hernia MUSCULOSKELETAL: muscle bulk and strength are grossly normal.  no obvious joint swelling.  gait  is normal and steady EXTEMITIES: no deformity.  no ulcer on the feet.  feet are of normal color and temp.  no edema PULSES: dorsalis pedis intact bilat.  no carotid bruit NEURO:  cn 2-12 grossly intact.   readily moves all 4's.  sensation is intact to touch on the feet SKIN:  Normal texture and temperature.  No rash or suspicious lesion is visible.   NODES:  None palpable at the neck PSYCH: alert, well-oriented.  Does not appear anxious nor depressed.  i personally reviewed electrocardiogram tracing (today): Indication: DM Impression: normal  A1c=8.5%    Assessment & Plan:  Insulin-requiring type 2 DM: new to me he needs increased rx.  we will need to take this complex situation in stages (he has multiple needs:  discontunation of oral rx he cannot afford, changing to multiple daily injections if he agrees, and changing to inexpensive insulin for when he loses his insurance).   Patient is advised the following: Patient Instructions  good diet and exercise significantly improve the control of your diabetes.  please let me know if you wish to be referred to a dietician.  high blood sugar is very risky to your health.  you should see an eye doctor and dentist every year.  It is very important to get all recommended vaccinations.  controlling your blood pressure and cholesterol drastically reduces the damage diabetes does to your body.  Those who smoke should quit.  please discuss these with your doctor.  check your blood sugar twice a day.  vary the time of day when you check, between before the 3 meals, and at bedtime.  also check if you have symptoms of your blood sugar being too high or too low.  please keep a record of the readings and bring it to your next appointment here (or you can bring the meter itself).  You can write it on any piece of paper.  please call us sooner if your blood sugar goes below 70, or if you have a lot of readings over 200. For now, please stop taking the janumet, and:  Increase the lantus to 55 units each morning.   Please come back for a follow-up appointment in 1 month.

## 2015-11-17 ENCOUNTER — Ambulatory Visit: Payer: BLUE CROSS/BLUE SHIELD | Admitting: Endocrinology

## 2015-11-18 ENCOUNTER — Encounter: Payer: Self-pay | Admitting: Endocrinology

## 2015-11-18 ENCOUNTER — Ambulatory Visit (INDEPENDENT_AMBULATORY_CARE_PROVIDER_SITE_OTHER): Payer: BLUE CROSS/BLUE SHIELD | Admitting: Endocrinology

## 2015-11-18 VITALS — BP 134/84 | HR 96 | Temp 98.4°F | Ht 69.0 in | Wt 197.0 lb

## 2015-11-18 DIAGNOSIS — E119 Type 2 diabetes mellitus without complications: Secondary | ICD-10-CM

## 2015-11-18 DIAGNOSIS — Z794 Long term (current) use of insulin: Secondary | ICD-10-CM | POA: Diagnosis not present

## 2015-11-18 MED ORDER — INSULIN LISPRO 100 UNIT/ML (KWIKPEN): 20.0000 [IU] | PEN_INJECTOR | Freq: Three times a day (TID) | SUBCUTANEOUS | Status: DC

## 2015-11-18 NOTE — Patient Instructions (Addendum)
check your blood sugar twice a day.  vary the time of day when you check, between before the 3 meals, and at bedtime.  also check if you have symptoms of your blood sugar being too high or too low.  please keep a record of the readings and bring it to your next appointment here (or you can bring the meter itself).  You can write it on any piece of paper.  please call us sooner if your blood sugar goes below 70, or if you have a lot of readings over 200.  change the lantus to humalog, 20 units 3 times a day (just before each meal), and go off the janumet.  Please call us next week, to tell us how the blood sugar is doing, as we'll need to further adjust this.   Please come back for a follow-up appointment in 1 month.

## 2015-11-18 NOTE — Progress Notes (Signed)
Subjective:    Patient ID: Wayne Diaz, male    DOB: 11/27/1960, 55 y.o.   MRN: [id number]  HPI Pt returns for f/u of diabetes mellitus: DM type: Insulin-requiring type 2 Dx'ed: 2002 Complications: none Therapy: insulin since 2010 DKA: never Severe hypoglycemia: never Pancreatitis: never Other: he may lose his insurance soon Interval history: no cbg record, but states cbg's vary from the 100's-200's.  He has decreased insulin back to 45 units qd, and he resumed janumet.  pt states he feels well in general. Past Medical History  Diagnosis Date  . HYPERLIPIDEMIA 05/05/2008  . GERD 05/05/2008  . DIABETES MELLITUS, TYPE II 04/28/2008  . ASTHMA 05/05/2008  . ALLERGIC RHINITIS 05/05/2008  . SMOKER 03/10/2009  . HYPERSOMNIA 03/10/2009    Past Surgical History  Procedure Laterality Date  . S/p left knee surgury      ACL repair  . Tonsillectomy      Social History   Social History  . Marital Status: Married    Spouse Name: N/A  . Number of Children: 4  . Years of Education: 16   Occupational History  . banking business    Social History Main Topics  . Smoking status: Current Every Day Smoker -- 1.00 packs/day for 30 years    Types: Cigarettes  . Smokeless tobacco: Never Used  . Alcohol Use: No  . Drug Use: No  . Sexual Activity: Not on file   Other Topics Concern  . Not on file   Social History Narrative   Born [place] raised [place] Currently resides in house with his wife and daughter. Fun: Yard work, baseball.   Denies religious beliefs effecting health care.     Current Outpatient Prescriptions on File Prior to Visit  Medication Sig Dispense Refill  . glucose blood (ONE TOUCH ULTRA TEST) test strip 1 each by Other route 2 (two) times daily. And lancets 2/day. 180 each 3  . pravastatin (PRAVACHOL) 40 MG tablet Take 2 tablets (80 mg total) by mouth daily. 180 tablet 1  . pravastatin (PRAVACHOL) 80 MG tablet Take 1 tablet (80 mg total) by  mouth daily. D/C THE RX FOR 40 MG 90 tablet 3  . tadalafil (CIALIS) 10 MG tablet Take 1 tablet (10 mg total) by mouth daily as needed for erectile dysfunction. 10 tablet 0   No current facility-administered medications on file prior to visit.    No Known Allergies  Family History  Problem Relation Age of Onset  . Arthritis Other   . Hyperlipidemia Other   . Heart disease Other   . Hypertension Other   . Diabetes Other     BP 134/84 mmHg  Pulse 96  Temp(Src) 98.4 F (36.9 C) (Oral)  Ht 5\' 9"  (1.753 m)  Wt 197 lb (89.359 kg)  BMI 29.08 kg/m2  SpO2 94%  Review of Systems He denies hypoglycemia.      Objective:   Physical Exam VITAL SIGNS:  See vs page.   GENERAL: no distress.  SKIN:  Insulin injection sites at the anterior abdomen are normal.     Lab Results  Component Value Date   HGBA1C 8.5 10/17/2015      Assessment & Plan:  DM: he needs increased rx.  We discussed. He agrees to take multiple daily injections.  Patient is advised the following: Patient Instructions  check your blood sugar twice a day.  vary the time of day when you check, between before the 3  meals, and at bedtime.  also check if you have symptoms of your blood sugar being too high or too low.  please keep a record of the readings and bring it to your next appointment here (or you can bring the meter itself).  You can write it on any piece of paper.  please call us sooner if your blood sugar goes below 70, or if you have a lot of readings over 200.  change the lantus to humalog, 20 units 3 times a day (just before each meal), and go off the janumet.  Please call us next week, to tell us how the blood sugar is doing, as we'll need to further adjust this.   Please come back for a follow-up appointment in 1 month.

## 2015-11-30 ENCOUNTER — Other Ambulatory Visit: Payer: Self-pay | Admitting: Endocrinology

## 2015-11-30 NOTE — Telephone Encounter (Signed)
Please advise if ok to refill. Medication is not on current med list.  

## 2015-12-20 ENCOUNTER — Ambulatory Visit: Payer: BLUE CROSS/BLUE SHIELD | Admitting: Endocrinology

## 2015-12-21 ENCOUNTER — Telehealth: Payer: Self-pay | Admitting: Endocrinology

## 2015-12-21 NOTE — Telephone Encounter (Signed)
Please come back for a follow-up appointment in 2 weeks 

## 2015-12-21 NOTE — Telephone Encounter (Signed)
Patient no showed today's appt. Please advise on how to follow up. °A. No follow up necessary. °B. Follow up urgent. Contact patient immediately. °C. Follow up necessary. Contact patient and schedule visit in ___ days. °D. Follow up advised. Contact patient and schedule visit in ____weeks. ° °

## 2015-12-22 NOTE — Telephone Encounter (Signed)
Patricia, Could you contact the pt and reschedule.  Thanks! 

## 2016-01-27 ENCOUNTER — Telehealth: Payer: Self-pay | Admitting: Endocrinology

## 2016-01-27 NOTE — Telephone Encounter (Signed)
please call patient: We got PA form, but I have that this med was d/c'ed.  Are you still on it? Also, ov is due

## 2016-01-30 NOTE — Telephone Encounter (Signed)
And continue insulin

## 2016-01-30 NOTE — Telephone Encounter (Signed)
I contacted the Wayne Diaz and advised of note below. Wayne Diaz stated he is currently taking janument, but he will call back to schedule he was unavailable at this time to schedule.

## 2016-01-30 NOTE — Telephone Encounter (Signed)
I assume you can't come in due to no insurance. Therefore, it is best to d/c janumet

## 2016-01-31 NOTE — Telephone Encounter (Addendum)
Pt stated he would be able to come in for an appointment and has a supply of Janument at this time. Pt was unable to schedule yesterday because he was entertaining friends.

## 2016-02-02 ENCOUNTER — Telehealth: Payer: Self-pay | Admitting: Endocrinology

## 2016-02-02 NOTE — Telephone Encounter (Signed)
I contacted the pt's wife and advised we have received the PA form for the Janument. Pt was advised on 01/30/2016 to schedule an appointment so Dr. Everardo AllEllison could address if the pt needs to continue on the medication. She voiced understanding. Pt has scheduled an appointment for 02/03/2016.

## 2016-02-02 NOTE — Telephone Encounter (Signed)
Pt said that insurance is requiring PA for Janumet and he is almost out and needs that ASAP and also that [facility] said they faxed over the information but haven't heard anything back yet.

## 2016-02-03 ENCOUNTER — Encounter: Payer: Self-pay | Admitting: Endocrinology

## 2016-02-03 ENCOUNTER — Ambulatory Visit (INDEPENDENT_AMBULATORY_CARE_PROVIDER_SITE_OTHER): Payer: BLUE CROSS/BLUE SHIELD | Admitting: Endocrinology

## 2016-02-03 ENCOUNTER — Telehealth: Payer: Self-pay | Admitting: Family Medicine

## 2016-02-03 VITALS — BP 134/84 | HR 98 | Temp 98.7°F | Ht 69.0 in | Wt 203.0 lb

## 2016-02-03 DIAGNOSIS — E119 Type 2 diabetes mellitus without complications: Secondary | ICD-10-CM | POA: Diagnosis not present

## 2016-02-03 DIAGNOSIS — Z794 Long term (current) use of insulin: Secondary | ICD-10-CM

## 2016-02-03 LAB — POCT GLYCOSYLATED HEMOGLOBIN (HGB A1C): Hemoglobin A1C: 8.9

## 2016-02-03 MED ORDER — INSULIN GLARGINE 100 UNIT/ML SOLOSTAR PEN: 45.0000 [IU] | PEN_INJECTOR | SUBCUTANEOUS | Status: DC

## 2016-02-03 MED ORDER — TADALAFIL 10 MG PO TABS: 10.0000 mg | ORAL_TABLET | Freq: Every day | ORAL | Status: DC | PRN

## 2016-02-03 MED ORDER — SITAGLIP PHOS-METFORMIN HCL ER 100-1000 MG PO TB24: 1.0000 | ORAL_TABLET | Freq: Every day | ORAL | Status: DC

## 2016-02-03 MED ORDER — PIOGLITAZONE HCL 45 MG PO TABS: 45.0000 mg | ORAL_TABLET | Freq: Every day | ORAL | Status: DC

## 2016-02-03 NOTE — Progress Notes (Signed)
Subjective:    Patient ID: Wayne Diaz, male    DOB: Aug 04, 1961, 55 y.o.   MRN: [id number]  HPI Pt returns for f/u of diabetes mellitus: DM type: Insulin-requiring type 2 Dx'ed: 2002 Complications: none Therapy: insulin since 2010 DKA: never Severe hypoglycemia: never Pancreatitis: never Other: he has regained his insurance.   Interval history: He has having frequent mild hypoglycemia, at any time of day--sometimes 5 hrs after eating (and injecting humalog).  It is highest (300) right after eating.   Past Medical History  Diagnosis Date  . HYPERLIPIDEMIA 05/05/2008  . GERD 05/05/2008  . DIABETES MELLITUS, TYPE II 04/28/2008  . ASTHMA 05/05/2008  . ALLERGIC RHINITIS 05/05/2008  . SMOKER 03/10/2009  . HYPERSOMNIA 03/10/2009    Past Surgical History  Procedure Laterality Date  . S/p left knee surgury      ACL repair  . Tonsillectomy      Social History   Social History  . Marital Status: Married    Spouse Name: N/A  . Number of Children: 4  . Years of Education: 16   Occupational History  . banking business    Social History Main Topics  . Smoking status: Current Every Day Smoker -- 1.00 packs/day for 30 years    Types: Cigarettes  . Smokeless tobacco: Never Used  . Alcohol Use: No  . Drug Use: No  . Sexual Activity: Not on file   Other Topics Concern  . Not on file   Social History Narrative   Born and raised in [place], [place]. [place]. Currently resides in house with his wife and daughter. Fun: Yard work, baseball.   Denies religious beliefs effecting health care.     Current Outpatient Prescriptions on File Prior to Visit  Medication Sig Dispense Refill  . glucose blood (ONE TOUCH ULTRA TEST) test strip 1 each by Other route 2 (two) times daily. And lancets 2/day. 180 each 3  . pravastatin (PRAVACHOL) 80 MG tablet Take 1 tablet (80 mg total) by mouth daily. D/C THE RX FOR 40 MG 90 tablet 3  . albuterol (PROAIR HFA) 108 (90 Base) MCG/ACT inhaler  Inhale into the lungs every 6 (six) hours as needed for wheezing or shortness of breath. Reported on 02/03/2016     No current facility-administered medications on file prior to visit.    No Known Allergies  Family History  Problem Relation Age of Onset  . Arthritis Other   . Hyperlipidemia Other   . Heart disease Other   . Hypertension Other   . Diabetes Other     BP 134/84 mmHg  Pulse 98  Temp(Src) 98.7 F (37.1 C) (Oral)  Ht  (1.753 m)  Wt 203 lb (92.08 kg)  BMI 29.96 kg/m2  SpO2 94%  Review of Systems Denies LOC.     Objective:   Physical Exam VITAL SIGNS:  See vs page GENERAL: no distress Pulses: dorsalis pedis intact bilat.   MSK: no deformity of the feet CV: no leg edema Skin:  no ulcer on the feet.  normal color and temp on the feet. Neuro: sensation is intact to touch on the feet Ext: There is bilateral onychomycosis of the toenails.     A1c=8.9%    Assessment & Plan:  DM: worse.  He seems to not be gaining the benefits of multiple daily injections, due to long duration of action of humalog.  We'll try to add more orals, and to reduce insulin.   Hypoglycemia: this  is limiting how aggressive we can be with insulin.  Obesity: worse.   Patient is advised the following: Patient Instructions  check your blood sugar twice a day.  vary the time of day when you check, between before the 3 meals, and at bedtime.  also check if you have symptoms of your blood sugar being too high or too low.  please keep a record of the readings and bring it to your next appointment here (or you can bring the meter itself).  You can write it on any piece of paper.  please call us sooner if your blood sugar goes below 70, or if you have a lot of readings over 200.  change the humalog back to lantus, and also resume the janumet.  i have sent a prescription to your pharmacy, to add "pioglitizone."  Please come back for a follow-up appointment in 2 months.

## 2016-02-03 NOTE — Telephone Encounter (Signed)
Received call from [facility] regarding this patient. RN reports that the patient was seen by endocrinology today and was advised to resume janumet and reports he was to have a prescription sent to the pharmacy, though there was no prescription at the pharmacy. He has had to borrow the medication earlier in the week and asked for the date to be changed to earlier this week so his borrowed medication could be covered. I reviewed the notes and noted that it was recommended that he restart this medication, though it does not appear this was sent in. I reviewed his chart and PMH and medications and the note from today. I advised I would send this in for him and sent in a month supply. After getting off the phone with the RN I realized that back dating the prescription would likely not be legal and called the RN at [facility] back to advise her not to have the pharmacy back date the prescription. She noted she called the pharmacy and the issue was not whether the prescription was sent, though it was that the patient needed a prior authorization for the medication and that he would need to contact his office on Monday for this. She noted they would not be back dating the prescription. I voiced understanding.

## 2016-02-03 NOTE — Patient Instructions (Addendum)
check your blood sugar twice a day.  vary the time of day when you check, between before the 3 meals, and at bedtime.  also check if you have symptoms of your blood sugar being too high or too low.  please keep a record of the readings and bring it to your next appointment here (or you can bring the meter itself).  You can write it on any piece of paper.  please call us sooner if your blood sugar goes below 70, or if you have a lot of readings over 200.  change the humalog back to lantus, and also resume the janumet.  i have sent a prescription to your pharmacy, to add "pioglitizone."  Please come back for a follow-up appointment in 2 months.

## 2016-02-06 ENCOUNTER — Telehealth: Payer: Self-pay | Admitting: Endocrinology

## 2016-02-06 NOTE — Telephone Encounter (Signed)
[phone] °

## 2016-02-06 NOTE — Telephone Encounter (Signed)
PA received and placed on MD's desk to review.

## 2016-02-06 NOTE — Telephone Encounter (Signed)
[employer] requires PA for the [employer] RX

## 2016-02-07 ENCOUNTER — Telehealth: Payer: Self-pay | Admitting: Endocrinology

## 2016-02-07 ENCOUNTER — Other Ambulatory Visit: Payer: Self-pay | Admitting: Endocrinology

## 2016-02-07 MED ORDER — METFORMIN HCL ER 500 MG PO TB24: 2000.0000 mg | ORAL_TABLET | Freq: Every day | ORAL | Status: DC

## 2016-02-07 NOTE — Telephone Encounter (Signed)
please call patient: Ins wants us to do metformin and pioglitizone first. Then we can revisit the [place].

## 2016-02-07 NOTE — Telephone Encounter (Signed)
See note below alternative medications have been placed on your desk to review. Thanks!

## 2016-02-07 NOTE — Telephone Encounter (Signed)
Patient stated that she just got off the phone with [facility] they denied his medication Janumet RX, please call

## 2016-02-07 NOTE — Telephone Encounter (Signed)
I contacted the pt's wife and advised of note below. She voiced understanding and will advise the pt.  

## 2016-03-12 DIAGNOSIS — L0390 Cellulitis, unspecified: Secondary | ICD-10-CM | POA: Diagnosis not present

## 2016-03-12 DIAGNOSIS — R601 Generalized edema: Secondary | ICD-10-CM | POA: Diagnosis not present

## 2016-03-12 DIAGNOSIS — L03113 Cellulitis of right upper limb: Secondary | ICD-10-CM | POA: Diagnosis not present

## 2016-04-04 ENCOUNTER — Encounter: Payer: Self-pay | Admitting: Endocrinology

## 2016-04-04 ENCOUNTER — Ambulatory Visit (INDEPENDENT_AMBULATORY_CARE_PROVIDER_SITE_OTHER): Payer: BLUE CROSS/BLUE SHIELD | Admitting: Endocrinology

## 2016-04-04 VITALS — BP 136/80 | HR 98 | Temp 98.3°F | Ht 69.0 in | Wt 210.0 lb

## 2016-04-04 DIAGNOSIS — E119 Type 2 diabetes mellitus without complications: Secondary | ICD-10-CM

## 2016-04-04 DIAGNOSIS — Z794 Long term (current) use of insulin: Secondary | ICD-10-CM | POA: Diagnosis not present

## 2016-04-04 LAB — MICROALBUMIN / CREATININE URINE RATIO
CREATININE, U: 156.8 mg/dL
MICROALB/CREAT RATIO: 1 mg/g (ref 0.0–30.0)
Microalb, Ur: 1.6 mg/dL (ref 0.0–1.9)

## 2016-04-04 LAB — POCT GLYCOSYLATED HEMOGLOBIN (HGB A1C): Hemoglobin A1C: 8.5

## 2016-04-04 NOTE — Progress Notes (Signed)
Subjective:    Patient ID: Wayne Diaz, male    DOB: 02/03/1961, 55 y.o.   MRN: [id number]  HPI Pt returns for f/u of diabetes mellitus: DM type: Insulin-requiring type 2 Dx'ed: 2002 Complications: none Therapy: insulin since 2010 (and 3 oral meds).  DKA: never Severe hypoglycemia: never Pancreatitis: never Other: he has regained his insurance; he declined multiple daily injections. Interval history: He started taking janumet approx 1 month ago, after it was approved by insurance.  However, he says it has since been denied again.  Meter is downloaded today, and the printout is scanned into the record.  It varies from 100-280.  There is no trend throughout the day, but most are checked in am. Past Medical History  Diagnosis Date  . HYPERLIPIDEMIA 05/05/2008  . GERD 05/05/2008  . DIABETES MELLITUS, TYPE II 04/28/2008  . ASTHMA 05/05/2008  . ALLERGIC RHINITIS 05/05/2008  . SMOKER 03/10/2009  . HYPERSOMNIA 03/10/2009    Past Surgical History  Procedure Laterality Date  . S/p left knee surgury      ACL repair  . Tonsillectomy      Social History   Social History  . Marital Status: Married    Spouse Name: N/A  . Number of Children: 4  . Years of Education: 16   Occupational History  . banking business    Social History Main Topics  . Smoking status: Current Every Day Smoker -- 1.00 packs/day for 30 years    Types: Cigarettes  . Smokeless tobacco: Never Used  . Alcohol Use: No  . Drug Use: No  . Sexual Activity: Not on file   Other Topics Concern  . Not on file   Social History Narrative   Born [place] raised in [place], [place]. [place] Currently resides in house with his wife and daughter. Fun: Yard work, baseball.   Denies religious beliefs effecting health care.     Current Outpatient Prescriptions on File Prior to Visit  Medication Sig Dispense Refill  . albuterol (PROAIR HFA) 108 (90 Base) MCG/ACT inhaler Inhale into the lungs every 6 (six) hours as  needed for wheezing or shortness of breath. Reported on 02/03/2016    . glucose blood (ONE TOUCH ULTRA TEST) test strip 1 each by Other route 2 (two) times daily. And lancets 2/day. 180 each 3  . Insulin Glargine (LANTUS SOLOSTAR) 100 UNIT/ML Solostar Pen Inject 45 Units into the skin every morning. And pen needles 1/day 5 pen PRN  . pioglitazone (ACTOS) 45 MG tablet Take 1 tablet (45 mg total) by mouth daily. 30 tablet 11  . pravastatin (PRAVACHOL) 80 MG tablet Take 1 tablet (80 mg total) by mouth daily. D/C THE RX FOR 40 MG 90 tablet 3  . tadalafil (CIALIS) 10 MG tablet Take 1 tablet (10 mg total) by mouth daily as needed for erectile dysfunction. 10 tablet 11   No current facility-administered medications on file prior to visit.    No Known Allergies  Family History  Problem Relation Age of Onset  . Arthritis Other   . Hyperlipidemia Other   . Heart disease Other   . Hypertension Other   . Diabetes Other     BP 136/80 mmHg  Pulse 98  Temp(Src) 98.3 F (36.8 C) (Oral)  Ht 5\' 9"  (1.753 m)  Wt 210 lb (95.255 kg)  BMI 31.00 kg/m2  SpO2 93%  Review of Systems He denies hypoglycemia.      Objective:   Physical Exam VITAL SIGNS:  See vs page GENERAL: no distress Pulses: dorsalis pedis intact bilat.   MSK: no deformity of the feet CV: no leg edema Skin:  no ulcer on the feet.  normal color and temp on the feet. Neuro: sensation is intact to touch on the feet.   A1c=8.5%    Assessment & Plan:  DM: he needs increased rx.    Patient is advised the following: Patient Instructions  check your blood sugar twice a day.  vary the time of day when you check, between before the 3 meals, and at bedtime.  also check if you have symptoms of your blood sugar being too high or too low.  please keep a record of the readings and bring it to your next appointment here (or you can bring the meter itself).  You can write it on any piece of paper.  please call us sooner if your blood sugar  goes below 70, or if you have a lot of readings over 200.  Please continue the same medications for now.  We'll redo the prior authorization for janumet.  As the pioglitizone continues to work, you have need less insulin, so please call if you have lows.   Please come back for a follow-up appointment in 3 months.      Romero Belling, MD

## 2016-04-04 NOTE — Patient Instructions (Addendum)
check your blood sugar twice a day.  vary the time of day when you check, between before the 3 meals, and at bedtime.  also check if you have symptoms of your blood sugar being too high or too low.  please keep a record of the readings and bring it to your next appointment here (or you can bring the meter itself).  You can write it on any piece of paper.  please call us sooner if your blood sugar goes below 70, or if you have a lot of readings over 200.  Please continue the same medications for now.  We'll redo the prior authorization for janumet.  As the pioglitizone continues to work, you have need less insulin, so please call if you have lows.   Please come back for a follow-up appointment in 3 months.

## 2016-04-06 ENCOUNTER — Other Ambulatory Visit: Payer: Self-pay | Admitting: Endocrinology

## 2016-05-07 ENCOUNTER — Telehealth: Payer: Self-pay | Admitting: Endocrinology

## 2016-05-07 NOTE — Telephone Encounter (Signed)
Patient wife ask if her husband could have another coupon card for his medication JANUMET XR [phone] MG TB24, please advise

## 2016-05-07 NOTE — Telephone Encounter (Signed)
Attempted to reach the pt and the pt's wife. Neither were available and did not have working VM's. Will try again at a later time

## 2016-05-10 NOTE — Telephone Encounter (Signed)
I contacted the pt and advised at this time we do not have any discount cards for Janumet at this time. Pt was advised to contact PCP and verify if they had any discount cards. Pt voiced understanding.

## 2016-05-15 ENCOUNTER — Telehealth: Payer: Self-pay | Admitting: Family

## 2016-05-15 NOTE — Telephone Encounter (Signed)
Pt called wondering if we have discount card for JANUMET XR [phone] MG TB24, Dr. Everardo AllEllison office ran out of it. Please call him back

## 2016-05-17 NOTE — Telephone Encounter (Signed)
Called pt back to let him know that we do have discount card available. I have sent card in the mail. Pt is aware

## 2016-07-03 ENCOUNTER — Ambulatory Visit (INDEPENDENT_AMBULATORY_CARE_PROVIDER_SITE_OTHER): Payer: BLUE CROSS/BLUE SHIELD | Admitting: Endocrinology

## 2016-07-03 ENCOUNTER — Encounter: Payer: Self-pay | Admitting: Endocrinology

## 2016-07-03 VITALS — BP 112/76 | HR 96 | Wt 214.0 lb

## 2016-07-03 DIAGNOSIS — Z794 Long term (current) use of insulin: Secondary | ICD-10-CM | POA: Diagnosis not present

## 2016-07-03 DIAGNOSIS — E119 Type 2 diabetes mellitus without complications: Secondary | ICD-10-CM

## 2016-07-03 LAB — POCT GLYCOSYLATED HEMOGLOBIN (HGB A1C): Hemoglobin A1C: 8.4

## 2016-07-03 MED ORDER — CANAGLIFLOZIN 100 MG PO TABS: 100.0000 mg | ORAL_TABLET | Freq: Every day | ORAL | 11 refills | Status: DC

## 2016-07-03 MED ORDER — SITAGLIP PHOS-METFORMIN HCL ER 50-1000 MG PO TB24: 2.0000 | ORAL_TABLET | ORAL | 11 refills | Status: DC

## 2016-07-03 NOTE — Patient Instructions (Addendum)
check your blood sugar twice a day.  vary the time of day when you check, between before the 3 meals, and at bedtime.  also check if you have symptoms of your blood sugar being too high or too low.  please keep a record of the readings and bring it to your next appointment here (or you can bring the meter itself).  You can write it on any piece of paper.  please call us sooner if your blood sugar goes below 70, or if you have a lot of readings over 200.  Please start taking the janumet.  I have also sent a prescription to your pharmacy, to add invokana.   Please come back for a follow-up appointment in 2 months.

## 2016-07-03 NOTE — Progress Notes (Signed)
Subjective:    Patient ID: Wayne Diaz, male    DOB: 1960-11-08, 55 y.o.   MRN: [id number]  HPI Pt returns for f/u of diabetes mellitus: DM type: Insulin-requiring type 2 Dx'ed: 2002 Complications: none Therapy: insulin since 2010 (and 3 oral meds).  DKA: never Severe hypoglycemia: never Pancreatitis: never Other: he has regained his insurance; he declined multiple daily injections; plan is to see if he can be controlled with oral rx.   Interval history: Thera Flake has been approved by insurance, and he had it filled.  However, he does not take.  Meter is downloaded today, and the printout is scanned into the record.  It varies from 130-250.  Most are checked in am.   Past Medical History:  Diagnosis Date  . ALLERGIC RHINITIS 05/05/2008  . ASTHMA 05/05/2008  . DIABETES MELLITUS, TYPE II 04/28/2008  . GERD 05/05/2008  . HYPERLIPIDEMIA 05/05/2008  . HYPERSOMNIA 03/10/2009  . SMOKER 03/10/2009    Past Surgical History:  Procedure Laterality Date  . s/p left knee surgury     ACL repair  . TONSILLECTOMY      Social History   Social History  . Marital status: Married    Spouse name: N/A  . Number of children: 4  . Years of education: 42   Occupational History  . banking business    Social History Main Topics  . Smoking status: Current Every Day Smoker    Packs/day: 1.00    Years: 30.00    Types: Cigarettes  . Smokeless tobacco: Never Used  . Alcohol use No  . Drug use: No  . Sexual activity: Not on file   Other Topics Concern  . Not on file   Social History Narrative   Born and raised in [place], [place]. Raised [place]. Currently resides in house with his wife and daughter. Fun: Yard work, baseball.   Denies religious beliefs effecting health care.     Current Outpatient Prescriptions on File Prior to Visit  Medication Sig Dispense Refill  . albuterol (PROAIR HFA) 108 (90 Base) MCG/ACT inhaler Inhale into the lungs every 6 (six) hours as needed for wheezing or  shortness of breath. Reported on 02/03/2016    . glucose blood (ONE TOUCH ULTRA TEST) test strip 1 each by Other route 2 (two) times daily. And lancets 2/day. 180 each 3  . Insulin Glargine (LANTUS SOLOSTAR) 100 UNIT/ML Solostar Pen Inject 45 Units into the skin every morning. And pen needles 1/day 5 pen PRN  . pioglitazone (ACTOS) 45 MG tablet Take 1 tablet (45 mg total) by mouth daily. 30 tablet 11  . pravastatin (PRAVACHOL) 80 MG tablet Take 1 tablet (80 mg total) by mouth daily. D/C THE RX FOR 40 MG 90 tablet 3  . tadalafil (CIALIS) 10 MG tablet Take 1 tablet (10 mg total) by mouth daily as needed for erectile dysfunction. 10 tablet 11   No current facility-administered medications on file prior to visit.     No Known Allergies  Family History  Problem Relation Age of Onset  . Arthritis Other   . Hyperlipidemia Other   . Heart disease Other   . Hypertension Other   . Diabetes Other     BP 112/76   Pulse 96   Wt 214 lb (97.1 kg)   BMI 31.60 kg/m   Review of Systems He denies hypoglycemia.     Objective:   Physical Exam VITAL SIGNS:  See vs page GENERAL: no distress Pulses: dorsalis  pedis intact bilat.   MSK: no deformity of the feet CV: 1+ bilat leg edema Skin:  no ulcer on the feet.  normal color and temp on the feet. Neuro: sensation is intact to touch on the feet.     A1c=8.5%    Assessment & Plan:  Insulin-requiring type 2 DM: he needs increased rx Edema: new: prob due to pioglitizone + insulin Noncompliance with medication: this complicates the rx of DM

## 2016-07-05 ENCOUNTER — Telehealth: Payer: Self-pay | Admitting: Endocrinology

## 2016-07-05 NOTE — Telephone Encounter (Signed)
Patient returning your call.

## 2016-07-10 NOTE — Telephone Encounter (Signed)
I contacted the patient. Patient stated on 07/03/2016 he had received a call from our office, but no voicemail. I advised the patient and advised him we have no documentation for a phone call from 07/03/2016 and no new information at this time. Patient voiced understanding.

## 2016-08-26 ENCOUNTER — Other Ambulatory Visit: Payer: Self-pay | Admitting: Family

## 2016-09-04 ENCOUNTER — Ambulatory Visit (INDEPENDENT_AMBULATORY_CARE_PROVIDER_SITE_OTHER): Payer: BLUE CROSS/BLUE SHIELD | Admitting: Endocrinology

## 2016-09-04 ENCOUNTER — Encounter: Payer: Self-pay | Admitting: Endocrinology

## 2016-09-04 VITALS — BP 132/80 | HR 96 | Ht 69.0 in | Wt 214.0 lb

## 2016-09-04 DIAGNOSIS — Z794 Long term (current) use of insulin: Secondary | ICD-10-CM

## 2016-09-04 DIAGNOSIS — E119 Type 2 diabetes mellitus without complications: Secondary | ICD-10-CM

## 2016-09-04 LAB — POCT GLYCOSYLATED HEMOGLOBIN (HGB A1C): HEMOGLOBIN A1C: 8.4

## 2016-09-04 MED ORDER — GLUCOSE BLOOD VI STRP: 1.0000 | ORAL_STRIP | Freq: Two times a day (BID) | 3 refills | Status: DC

## 2016-09-04 MED ORDER — TADALAFIL 10 MG PO TABS: 10.0000 mg | ORAL_TABLET | Freq: Every day | ORAL | 11 refills | Status: DC | PRN

## 2016-09-04 NOTE — Patient Instructions (Addendum)
check your blood sugar twice a day.  vary the time of day when you check, between before the 3 meals, and at bedtime.  also check if you have symptoms of your blood sugar being too high or too low.  please keep a record of the readings and bring it to your next appointment here (or you can bring the meter itself).  You can write it on any piece of paper.  please call us sooner if your blood sugar goes below 70, or if you have a lot of readings over 200.   Please continue the same medications for diabetes.  Please come back for a follow-up appointment in 3 months.    

## 2016-09-04 NOTE — Progress Notes (Signed)
Subjective:    Patient ID: Wayne CritchleyJose Diaz, male    DOB: 06/30/1961, 55 y.o.   MRN: [id number]  HPI Pt returns for f/u of diabetes mellitus: DM type: Insulin-requiring type 2 Dx'ed: 2002 Complications: none Therapy: insulin since 2010 (and 4 oral meds).  DKA: never Severe hypoglycemia: never.  Pancreatitis: never.   Other: he has regained his insurance; he declined multiple daily injections; plan is to see if he can be controlled with oral rx.   Interval history: no cbg record, but states cbg's vary from 73 up to the low-200's.  He says glycemic control is better over the past week, due to eating pickles.  He takes meds as rx'ed.   Past Medical History:  Diagnosis Date  . ALLERGIC RHINITIS 05/05/2008  . ASTHMA 05/05/2008  . DIABETES MELLITUS, TYPE II 04/28/2008  . GERD 05/05/2008  . HYPERLIPIDEMIA 05/05/2008  . HYPERSOMNIA 03/10/2009  . SMOKER 03/10/2009    Past Surgical History:  Procedure Laterality Date  . s/p left knee surgury     ACL repair  . TONSILLECTOMY      Social History   Social History  . Marital status: Married    Spouse name: N/A  . Number of children: 4  . Years of education: 4616   Occupational History  . banking business    Social History Main Topics  . Smoking status: Current Every Day Smoker    Packs/day: 1.00    Years: 30.00    Types: Cigarettes  . Smokeless tobacco: Never Used  . Alcohol use No  . Drug use: No  . Sexual activity: Not on file   Other Topics Concern  . Not on file   Social History Narrative   Born and raised in [place], [place] Currently resides in house with his wife and daughter. Fun: Yard work, baseball.   Denies religious beliefs effecting health care.     Current Outpatient Prescriptions on File Prior to Visit  Medication Sig Dispense Refill  . albuterol (PROAIR HFA) 108 (90 Base) MCG/ACT inhaler Inhale into the lungs every 6 (six) hours as needed for wheezing or shortness of breath. Reported on  02/03/2016    . canagliflozin (INVOKANA) 100 MG TABS tablet Take 1 tablet (100 mg total) by mouth daily before breakfast. 30 tablet 11  . Insulin Glargine (LANTUS SOLOSTAR) 100 UNIT/ML Solostar Pen Inject 45 Units into the skin every morning. And pen needles 1/day 5 pen PRN  . pioglitazone (ACTOS) 45 MG tablet Take 1 tablet (45 mg total) by mouth daily. 30 tablet 11  . pravastatin (PRAVACHOL) 80 MG tablet TAKE 1 TABLET BY MOUTH EVERY DAY 90 tablet 3  . SitaGLIPtin-MetFORMIN HCl (JANUMET XR) 50-1000 MG TB24 Take 2 tablets by mouth every morning. 60 tablet 11   No current facility-administered medications on file prior to visit.     No Known Allergies  Family History  Problem Relation Age of Onset  . Arthritis Other   . Hyperlipidemia Other   . Heart disease Other   . Hypertension Other   . Diabetes Other    BP 132/80   Pulse 96   Ht 5\' 9"  (1.753 m)   Wt 214 lb (97.1 kg)   SpO2 92%   BMI 31.60 kg/m   Review of Systems He denies hypoglycemia    Objective:   Physical Exam VITAL SIGNS:  See vs page GENERAL: no distress Pulses: dorsalis pedis intact bilat.   MSK: no deformity of the feet  CV: trace bilat leg edema.   Skin:  no ulcer on the feet.  normal color and temp on the feet. Neuro: sensation is intact to touch on the feet.   A1c=8.2%    Assessment & Plan:  Insulin-requiring type 2 DM: he declines to increase rx.    Patient is advised the following: Patient Instructions  check your blood sugar twice a day.  vary the time of day when you check, between before the 3 meals, and at bedtime.  also check if you have symptoms of your blood sugar being too high or too low.  please keep a record of the readings and bring it to your next appointment here (or you can bring the meter itself).  You can write it on any piece of paper.  please call us sooner if your blood sugar goes below 70, or if you have a lot of readings over 200.  Please continue the same medications for diabetes.    Please come back for a follow-up appointment in 3 months.

## 2016-11-11 DIAGNOSIS — J0100 Acute maxillary sinusitis, unspecified: Secondary | ICD-10-CM | POA: Diagnosis not present

## 2016-12-04 ENCOUNTER — Ambulatory Visit (INDEPENDENT_AMBULATORY_CARE_PROVIDER_SITE_OTHER): Payer: BLUE CROSS/BLUE SHIELD | Admitting: Endocrinology

## 2016-12-04 ENCOUNTER — Encounter: Payer: Self-pay | Admitting: Endocrinology

## 2016-12-04 VITALS — BP 136/84 | HR 101 | Ht 69.0 in | Wt 211.0 lb

## 2016-12-04 DIAGNOSIS — Z794 Long term (current) use of insulin: Secondary | ICD-10-CM

## 2016-12-04 DIAGNOSIS — E119 Type 2 diabetes mellitus without complications: Secondary | ICD-10-CM

## 2016-12-04 LAB — POCT GLYCOSYLATED HEMOGLOBIN (HGB A1C): HEMOGLOBIN A1C: 8

## 2016-12-04 MED ORDER — SITAGLIP PHOS-METFORMIN HCL ER 50-1000 MG PO TB24: 2.0000 | ORAL_TABLET | ORAL | 11 refills | Status: DC

## 2016-12-04 NOTE — Progress Notes (Signed)
Subjective:    Patient ID: Wayne Diaz, male    DOB: 1961/03/19, 56 y.o.   MRN: [id number]  HPI Pt returns for f/u of diabetes mellitus: DM type: Insulin-requiring type 2 Dx'ed: 2002 Complications: none Therapy: insulin since 2010 (and 4 oral meds).  DKA: never.  Severe hypoglycemia: never.  Pancreatitis: never.   Other: he has regained his insurance; he declined multiple daily injections;  Interval history: He takes metformin rather than janumet.  no cbg record, but states cbg's are well-controlled.   Past Medical History:  Diagnosis Date  . ALLERGIC RHINITIS 05/05/2008  . ASTHMA 05/05/2008  . DIABETES MELLITUS, TYPE II 04/28/2008  . GERD 05/05/2008  . HYPERLIPIDEMIA 05/05/2008  . HYPERSOMNIA 03/10/2009  . SMOKER 03/10/2009    Past Surgical History:  Procedure Laterality Date  . s/p left knee surgury     ACL repair  . TONSILLECTOMY      Social History   Social History  . Marital status: Married    Spouse name: N/A  . Number of children: 4  . Years of education: 42   Occupational History  . banking business    Social History Main Topics  . Smoking status: Current Every Day Smoker    Packs/day: 1.00    Years: 30.00    Types: Cigarettes  . Smokeless tobacco: Never Used  . Alcohol use No  . Drug use: No  . Sexual activity: Not on file   Other Topics Concern  . Not on file   Social History Narrative   Born and raised in [place], [place]. Raised [place]. Currently resides in house with his wife and daughter. Fun: Yard work, baseball.   Denies religious beliefs effecting health care.     Current Outpatient Prescriptions on File Prior to Visit  Medication Sig Dispense Refill  . canagliflozin (INVOKANA) 100 MG TABS tablet Take 1 tablet (100 mg total) by mouth daily before breakfast. 30 tablet 11  . glucose blood (ONE TOUCH ULTRA TEST) test strip 1 each by Other route 2 (two) times daily. And lancets 2/day. 180 each 3  . Insulin Glargine (LANTUS SOLOSTAR) 100  UNIT/ML Solostar Pen Inject 45 Units into the skin every morning. And pen needles 1/day 5 pen PRN  . pioglitazone (ACTOS) 45 MG tablet Take 1 tablet (45 mg total) by mouth daily. 30 tablet 11  . pravastatin (PRAVACHOL) 80 MG tablet TAKE 1 TABLET BY MOUTH EVERY DAY 90 tablet 3  . tadalafil (CIALIS) 10 MG tablet Take 1 tablet (10 mg total) by mouth daily as needed for erectile dysfunction. 10 tablet 11   No current facility-administered medications on file prior to visit.     No Known Allergies  Family History  Problem Relation Age of Onset  . Arthritis Other   . Hyperlipidemia Other   . Heart disease Other   . Hypertension Other   . Diabetes Other     BP 136/84   Pulse (!) 101   Ht 5\' 9"  (1.753 m)   Wt 211 lb (95.7 kg)   SpO2 92%   BMI 31.16 kg/m   Review of Systems He denies hypoglycemia.      Objective:   Physical Exam VITAL SIGNS:  See vs page GENERAL: no distress Pulses: dorsalis pedis intact bilat.   MSK: no deformity of the feet CV: trace bilat leg edema.   Skin:  no ulcer on the feet.  normal color and temp on the feet. Neuro: sensation is intact to touch  on the feet. Ext: There is bilateral onychomycosis of the toenails.    A1c=8.0%     Assessment & Plan:  Type 2 DM: he needs increased rx.   Patient is advised the following: Patient Instructions  check your blood sugar twice a day.  vary the time of day when you check, between before the 3 meals, and at bedtime.  also check if you have symptoms of your blood sugar being too high or too low.  please keep a record of the readings and bring it to your next appointment here (or you can bring the meter itself).  You can write it on any piece of paper.  please call us sooner if your blood sugar goes below 70, or if you have a lot of readings over 200.  Please change the metformin back to janumet.  Please continue the same other diabetes medications.  Please come back for a follow-up appointment in 3 months.

## 2016-12-04 NOTE — Patient Instructions (Addendum)
check your blood sugar twice a day.  vary the time of day when you check, between before the 3 meals, and at bedtime.  also check if you have symptoms of your blood sugar being too high or too low.  please keep a record of the readings and bring it to your next appointment here (or you can bring the meter itself).  You can write it on any piece of paper.  please call us sooner if your blood sugar goes below 70, or if you have a lot of readings over 200.  Please change the metformin back to janumet.  Please continue the same other diabetes medications.  Please come back for a follow-up appointment in 3 months.

## 2017-02-07 ENCOUNTER — Other Ambulatory Visit: Payer: Self-pay | Admitting: Endocrinology

## 2017-02-08 ENCOUNTER — Other Ambulatory Visit: Payer: Self-pay | Admitting: Endocrinology

## 2017-02-24 ENCOUNTER — Other Ambulatory Visit: Payer: Self-pay | Admitting: Endocrinology

## 2017-03-01 ENCOUNTER — Ambulatory Visit (INDEPENDENT_AMBULATORY_CARE_PROVIDER_SITE_OTHER): Payer: BLUE CROSS/BLUE SHIELD | Admitting: Endocrinology

## 2017-03-01 ENCOUNTER — Encounter: Payer: Self-pay | Admitting: Endocrinology

## 2017-03-01 VITALS — BP 122/82 | HR 95 | Ht 69.0 in | Wt 211.0 lb

## 2017-03-01 DIAGNOSIS — E119 Type 2 diabetes mellitus without complications: Secondary | ICD-10-CM

## 2017-03-01 DIAGNOSIS — Z794 Long term (current) use of insulin: Secondary | ICD-10-CM

## 2017-03-01 LAB — POCT GLYCOSYLATED HEMOGLOBIN (HGB A1C): HEMOGLOBIN A1C: 8

## 2017-03-01 MED ORDER — METFORMIN HCL ER 500 MG PO TB24: 2000.0000 mg | ORAL_TABLET | ORAL | 3 refills | Status: DC

## 2017-03-01 MED ORDER — INSULIN GLARGINE 100 UNIT/ML SOLOSTAR PEN: 50.0000 [IU] | PEN_INJECTOR | SUBCUTANEOUS | 7 refills | Status: DC

## 2017-03-01 NOTE — Progress Notes (Signed)
Subjective:    Patient ID: Wayne Diaz, male    DOB: 16-May-1961, 56 y.o.   MRN: [id number]  HPI Pt returns for f/u of diabetes mellitus: DM type: Insulin-requiring type 2 Dx'ed: 2002 Complications: none Therapy: insulin since 2010 (and 4 oral meds).  DKA: never.  Severe hypoglycemia: never.  Pancreatitis: never.   Other: he has regained his insurance; he declined multiple daily injections.   Interval history: He says exercise has a great effect on cbg's, but he cannot predict what days he will be active.  no cbg record, but states cbg's are well-controlled.  He says cbg's vary from 94-200's.  He changed janumet back to metformin.   Past Medical History:  Diagnosis Date  . ALLERGIC RHINITIS 05/05/2008  . ASTHMA 05/05/2008  . DIABETES MELLITUS, TYPE II 04/28/2008  . GERD 05/05/2008  . HYPERLIPIDEMIA 05/05/2008  . HYPERSOMNIA 03/10/2009  . SMOKER 03/10/2009    Past Surgical History:  Procedure Laterality Date  . s/p left knee surgury     ACL repair  . TONSILLECTOMY      Social History   Social History  . Marital status: Married    Spouse name: N/A  . Number of children: 4  . Years of education: 71   Occupational History  . banking business    Social History Main Topics  . Smoking status: Current Every Day Smoker    Packs/day: 1.00    Years: 30.00    Types: Cigarettes  . Smokeless tobacco: Never Used  . Alcohol use No  . Drug use: No  . Sexual activity: Not on file   Other Topics Concern  . Not on file   Social History Narrative   Born and raised in [place], [place]. [place]. Currently resides in house with his wife and daughter. Fun: Yard work, baseball.   Denies religious beliefs effecting health care.     Current Outpatient Prescriptions on File Prior to Visit  Medication Sig Dispense Refill  . canagliflozin (INVOKANA) 100 MG TABS tablet Take 1 tablet (100 mg total) by mouth daily before breakfast. 30 tablet 11  . glucose blood (ONE TOUCH ULTRA  TEST) test strip 1 each by Other route 2 (two) times daily. And lancets 2/day. 180 each 3  . pioglitazone (ACTOS) 45 MG tablet TAKE 1 TABLET (45 MG TOTAL) BY MOUTH DAILY. 30 tablet 11  . pravastatin (PRAVACHOL) 80 MG tablet TAKE 1 TABLET BY MOUTH EVERY DAY 90 tablet 3  . tadalafil (CIALIS) 10 MG tablet Take 1 tablet (10 mg total) by mouth daily as needed for erectile dysfunction. 10 tablet 11   No current facility-administered medications on file prior to visit.     No Known Allergies  Family History  Problem Relation Age of Onset  . Arthritis Other   . Hyperlipidemia Other   . Heart disease Other   . Hypertension Other   . Diabetes Other     BP 122/82   Pulse 95   Ht  (1.753 m)   Wt 211 lb (95.7 kg)   SpO2 93%   BMI 31.16 kg/m    Review of Systems He denies hypoglycemia.     Objective:   Physical Exam VITAL SIGNS:  See vs page GENERAL: no distress Pulses: dorsalis pedis intact bilat.   MSK: no deformity of the feet CV: trace bilat leg edema.   Skin:  no ulcer on the feet.  normal color and temp on the feet. Neuro: sensation is intact  to touch on the feet.  Ext: There is bilateral onychomycosis of the toenails.   Lab Results  Component Value Date   HGBA1C 8.0 03/01/2017      Assessment & Plan:  Insulin-requiring type 2 DM: he needs increased rx.    Patient Instructions  check your blood sugar twice a day.  vary the time of day when you check, between before the 3 meals, and at bedtime.  also check if you have symptoms of your blood sugar being too high or too low.  please keep a record of the readings and bring it to your next appointment here (or you can bring the meter itself).  You can write it on any piece of paper.  please call us sooner if your blood sugar goes below 70, or if you have a lot of readings over 200.  Please increase the insulin to 50 units each morning, and:  Please continue the same other diabetes medications.  Please come back for a  follow-up appointment in 3 months.

## 2017-03-01 NOTE — Patient Instructions (Addendum)
check your blood sugar twice a day.  vary the time of day when you check, between before the 3 meals, and at bedtime.  also check if you have symptoms of your blood sugar being too high or too low.  please keep a record of the readings and bring it to your next appointment here (or you can bring the meter itself).  You can write it on any piece of paper.  please call us sooner if your blood sugar goes below 70, or if you have a lot of readings over 200.  Please increase the insulin to 50 units each morning, and:  Please continue the same other diabetes medications.  Please come back for a follow-up appointment in 3 months.

## 2017-05-31 ENCOUNTER — Ambulatory Visit: Payer: BLUE CROSS/BLUE SHIELD | Admitting: Endocrinology

## 2017-07-20 ENCOUNTER — Other Ambulatory Visit: Payer: Self-pay | Admitting: Endocrinology

## 2017-07-20 NOTE — Telephone Encounter (Signed)
Please refill x 1 Ov is due  

## 2017-07-22 ENCOUNTER — Other Ambulatory Visit: Payer: Self-pay

## 2017-07-22 MED ORDER — ONETOUCH DELICA LANCING DEV MISC: 0 refills | Status: AC

## 2017-08-20 DIAGNOSIS — J0100 Acute maxillary sinusitis, unspecified: Secondary | ICD-10-CM | POA: Diagnosis not present

## 2017-08-20 DIAGNOSIS — J209 Acute bronchitis, unspecified: Secondary | ICD-10-CM | POA: Diagnosis not present

## 2017-10-08 ENCOUNTER — Other Ambulatory Visit: Payer: Self-pay | Admitting: Family

## 2017-11-07 ENCOUNTER — Ambulatory Visit: Payer: BLUE CROSS/BLUE SHIELD | Admitting: Urgent Care

## 2017-11-07 ENCOUNTER — Encounter: Payer: Self-pay | Admitting: Urgent Care

## 2017-11-07 VITALS — BP 131/85 | HR 95 | Temp 97.9°F | Resp 18 | Ht 69.0 in | Wt 219.8 lb

## 2017-11-07 DIAGNOSIS — L089 Local infection of the skin and subcutaneous tissue, unspecified: Secondary | ICD-10-CM

## 2017-11-07 MED ORDER — CEPHALEXIN 500 MG PO CAPS: 500.0000 mg | ORAL_CAPSULE | Freq: Three times a day (TID) | ORAL | 0 refills | Status: DC

## 2017-11-07 NOTE — Progress Notes (Signed)
  MRN: [id number] DOB: 11/08/1960  Subjective:   Wayne Diaz is a 57 y.o. male presenting for 1 day history of left sided "mass" near his left eye. He notes slight drainage but denies pain, redness, eye pain, blurred vision, trauma.   Elita Diaz has a current medication list which includes the following prescription(s): glucose blood, invokana, one touch delica lancing dev, metformin, pioglitazone, pravastatin, and tadalafil. Also has No Known Allergies.  Elita Diaz  has a past medical history of ALLERGIC RHINITIS (05/05/2008), ASTHMA (05/05/2008), DIABETES MELLITUS, TYPE II (04/28/2008), GERD (05/05/2008), HYPERLIPIDEMIA (05/05/2008), HYPERSOMNIA (03/10/2009), and SMOKER (03/10/2009). Also  has a past surgical history that includes s/p left knee surgury and Tonsillectomy.  Objective:   Vitals: BP 131/85   Pulse 95   Temp 97.9 F (36.6 C) (Oral)   Resp 18   Ht 5\' 9"  (1.753 m)   Wt 219 lb 12.8 oz (99.7 kg)   SpO2 99%   BMI 32.46 kg/m   Physical Exam  Constitutional: He is oriented to person, place, and time. He appears well-developed and well-nourished.  Eyes:    Cardiovascular: Normal rate.  Pulmonary/Chest: Effort normal.  Neurological: He is alert and oriented to person, place, and time.   Assessment and Plan :   Superficial skin infection  Will start Keflex, use warm compresses. Return-to-clinic precautions discussed, patient verbalized understanding.   Wallis BambergMario Javonnie Illescas, PA-C [facility] [phone] 11/07/2017  2:55 PM

## 2017-11-07 NOTE — Patient Instructions (Addendum)
Use warm compresses 3 times daily for 20 minutes. Take Keflex with food.     IF you received an x-ray today, you will receive an invoice from [facility]. Please contact [facility] at [phone] with questions or concerns regarding your invoice.   IF you received labwork today, you will receive an invoice from [facility]. Please contact [facility] at [phone] with questions or concerns regarding your invoice.   Our billing staff will not be able to assist you with questions regarding bills from these companies.  You will be contacted with the lab results as soon as they are available. The fastest way to get your results is to activate your My Chart account. Instructions are located on the last page of this paperwork. If you have not heard from us regarding the results in 2 weeks, please contact this office.

## 2018-02-25 ENCOUNTER — Other Ambulatory Visit: Payer: Self-pay | Admitting: Endocrinology

## 2018-03-02 ENCOUNTER — Other Ambulatory Visit: Payer: Self-pay | Admitting: Endocrinology

## 2018-03-14 ENCOUNTER — Encounter: Payer: Self-pay | Admitting: Endocrinology

## 2018-03-14 ENCOUNTER — Ambulatory Visit: Payer: BLUE CROSS/BLUE SHIELD | Admitting: Endocrinology

## 2018-03-14 VITALS — BP 132/72 | HR 96 | Wt 218.4 lb

## 2018-03-14 DIAGNOSIS — E119 Type 2 diabetes mellitus without complications: Secondary | ICD-10-CM

## 2018-03-14 DIAGNOSIS — Z794 Long term (current) use of insulin: Secondary | ICD-10-CM | POA: Diagnosis not present

## 2018-03-14 LAB — POCT GLYCOSYLATED HEMOGLOBIN (HGB A1C): HEMOGLOBIN A1C: 7.8

## 2018-03-14 MED ORDER — INSULIN GLARGINE 100 UNIT/ML SOLOSTAR PEN: 55.0000 [IU] | PEN_INJECTOR | SUBCUTANEOUS | 11 refills | Status: DC

## 2018-03-14 MED ORDER — METFORMIN HCL ER 500 MG PO TB24: 1000.0000 mg | ORAL_TABLET | ORAL | 3 refills | Status: DC

## 2018-03-14 NOTE — Patient Instructions (Addendum)
check your blood sugar twice a day.  vary the time of day when you check, between before the 3 meals, and at bedtime.  also check if you have symptoms of your blood sugar being too high or too low.  please keep a record of the readings and bring it to your next appointment here (or you can bring the meter itself).  You can write it on any piece of paper.  please call us sooner if your blood sugar goes below 70, or if you have a lot of readings over 200.  Please increase the insulin to 55 units each morning on work days, and 50 units on off days Please continue the same other diabetes medications.  Please come back for a follow-up appointment in 4 months.

## 2018-03-14 NOTE — Progress Notes (Signed)
Subjective:    Patient ID: Wayne Diaz, male    DOB: September 26, 1961, 57 y.o.   MRN: [id number]  HPI Pt returns for f/u of diabetes mellitus: DM type: Insulin-requiring type 2 Dx'ed: 2002 Complications: none Therapy: insulin since 2010 (and 3 oral meds).  DKA: never.  Severe hypoglycemia: never.  Pancreatitis: never.   Other: he has regained his insurance; he declined multiple daily injections.   Interval history: no cbg record, but states cbg's vary from 150-170.  He says he seldom misses the insulin.  He says cbg's are higher on work days.  Past Medical History:  Diagnosis Date  . ALLERGIC RHINITIS 05/05/2008  . ASTHMA 05/05/2008  . DIABETES MELLITUS, TYPE II 04/28/2008  . GERD 05/05/2008  . HYPERLIPIDEMIA 05/05/2008  . HYPERSOMNIA 03/10/2009  . SMOKER 03/10/2009    Past Surgical History:  Procedure Laterality Date  . s/p left knee surgury     ACL repair  . TONSILLECTOMY      Social History   Socioeconomic History  . Marital status: Married    Spouse name: Not on file  . Number of children: 4  . Years of education: 69  . Highest education level: Not on file  Occupational History  . Occupation: Photographer business  Social Needs  . Financial resource strain: Not on file  . Food insecurity:    Worry: Not on file    Inability: Not on file  . Transportation needs:    Medical: Not on file    Non-medical: Not on file  Tobacco Use  . Smoking status: Current Every Day Smoker    Packs/day: 1.00    Years: 30.00    Pack years: 30.00    Types: Cigarettes  . Smokeless tobacco: Never Used  Substance and Sexual Activity  . Alcohol use: No  . Drug use: No  . Sexual activity: Not on file  Lifestyle  . Physical activity:    Days per week: Not on file    Minutes per session: Not on file  . Stress: Not on file  Relationships  . Social connections:    Talks on phone: Not on file    Gets together: Not on file    Attends religious service: Not on file    Active member of club or  organization: Not on file    Attends meetings of clubs or organizations: Not on file    Relationship status: Not on file  . Intimate partner violence:    Fear of current or ex partner: Not on file    Emotionally abused: Not on file    Physically abused: Not on file    Forced sexual activity: Not on file  Other Topics Concern  . Not on file  Social History Narrative   Born and raised in [place], [place]. Raised [place]. Currently resides in house with his wife and daughter. Fun: Yard work, baseball.   Denies religious beliefs effecting health care.     Current Outpatient Medications on File Prior to Visit  Medication Sig Dispense Refill  . glucose blood (ONE TOUCH ULTRA TEST) test strip 1 each by Other route 2 (two) times daily. And lancets 2/day. 180 each 3  . INVOKANA 100 MG TABS tablet TAKE 1 TABLET (100 MG TOTAL) BY MOUTH DAILY BEFORE BREAKFAST. 30 tablet 11  . Lancet Devices (ONE TOUCH DELICA LANCING DEV) MISC Used to check blood sugars. 1 each 0  . pioglitazone (ACTOS) 45 MG tablet TAKE 1 TABLET (45 MG TOTAL)  BY MOUTH DAILY. 30 tablet 11  . pravastatin (PRAVACHOL) 80 MG tablet TAKE 1 TABLET BY MOUTH EVERY DAY 90 tablet 3  . tadalafil (CIALIS) 10 MG tablet Take 1 tablet (10 mg total) by mouth daily as needed for erectile dysfunction. 10 tablet 11   No current facility-administered medications on file prior to visit.     No Known Allergies  Family History  Problem Relation Age of Onset  . Arthritis Other   . Hyperlipidemia Other   . Heart disease Other   . Hypertension Other   . Diabetes Other     BP 132/72   Pulse 96   Wt 218 lb 6.4 oz (99.1 kg)   SpO2 95%   BMI 32.25 kg/m    Review of Systems He denies hypoglycemia.  He says diarrhea limits metformin dosage to 1000 mg qd.     Objective:   Physical Exam VITAL SIGNS:  See vs page GENERAL: no distress Pulses: dorsalis pedis intact bilat.   MSK: no deformity of the feet CV: trace bilat leg edema Skin:   no ulcer on the feet.  normal color and temp on the feet. Neuro: sensation is intact to touch on the feet.   Ext: There is bilateral onychomycosis of the toenails.    A1c=7.8%     Assessment & Plan:  Insulin-requiring type 2 DM: he needs increased rx. Occupational status: he needs to adjust insulin for this Diarrhea: he needs to reduce metformin.   Patient Instructions  check your blood sugar twice a day.  vary the time of day when you check, between before the 3 meals, and at bedtime.  also check if you have symptoms of your blood sugar being too high or too low.  please keep a record of the readings and bring it to your next appointment here (or you can bring the meter itself).  You can write it on any piece of paper.  please call us sooner if your blood sugar goes below 70, or if you have a lot of readings over 200.  Please increase the insulin to 55 units each morning on work days, and 50 units on off days Please continue the same other diabetes medications.  Please come back for a follow-up appointment in 4 months.

## 2018-06-29 DIAGNOSIS — J209 Acute bronchitis, unspecified: Secondary | ICD-10-CM | POA: Diagnosis not present

## 2018-06-29 DIAGNOSIS — J45909 Unspecified asthma, uncomplicated: Secondary | ICD-10-CM | POA: Diagnosis not present

## 2018-07-18 ENCOUNTER — Ambulatory Visit: Payer: BLUE CROSS/BLUE SHIELD | Admitting: Endocrinology

## 2018-07-18 VITALS — BP 124/80 | HR 90 | Ht 69.0 in | Wt 218.0 lb

## 2018-07-18 DIAGNOSIS — Z794 Long term (current) use of insulin: Secondary | ICD-10-CM | POA: Diagnosis not present

## 2018-07-18 DIAGNOSIS — E119 Type 2 diabetes mellitus without complications: Secondary | ICD-10-CM

## 2018-07-18 LAB — POCT GLYCOSYLATED HEMOGLOBIN (HGB A1C): HEMOGLOBIN A1C: 7.2 % — AB (ref 4.0–5.6)

## 2018-07-18 MED ORDER — PIOGLITAZONE HCL 15 MG PO TABS: 15.0000 mg | ORAL_TABLET | Freq: Every day | ORAL | 3 refills | Status: DC

## 2018-07-18 NOTE — Patient Instructions (Addendum)
check your blood sugar twice a day.  vary the time of day when you check, between before the 3 meals, and at bedtime.  also check if you have symptoms of your blood sugar being too high or too low.  please keep a record of the readings and bring it to your next appointment here (or you can bring the meter itself).  You can write it on any piece of paper.  please call us sooner if your blood sugar goes below 70, or if you have a lot of readings over 200.  Please continue the same insulin: 55 units each morning on work days, and 50 units on off days.   Please reduce the pioglitazone to 15 mg daily Please continue the same metformin.  Please come back for a follow-up appointment in 4-5 months.

## 2018-07-18 NOTE — Progress Notes (Signed)
Subjective:    Patient ID: Wayne Diaz, male    DOB: 17-Jul-1961, 57 y.o.   MRN: [id number]  HPI Pt returns for f/u of diabetes mellitus: DM type: Insulin-requiring type 2 Dx'ed: 2002 Complications: none Therapy: insulin since 2010 (and 3 oral meds).  DKA: never.  Severe hypoglycemia: never.  Pancreatitis: never.   Other: he declines multiple daily injections.   Interval history: Meter is downloaded today, and the printout is scanned into the record. All are checked in the morning.  It varies from 110-250.  It is still higher on work days, despite higher insulin dosage then.  He says he never misses the insulin.   Past Medical History:  Diagnosis Date  . ALLERGIC RHINITIS 05/05/2008  . ASTHMA 05/05/2008  . DIABETES MELLITUS, TYPE II 04/28/2008  . GERD 05/05/2008  . HYPERLIPIDEMIA 05/05/2008  . HYPERSOMNIA 03/10/2009  . SMOKER 03/10/2009    Past Surgical History:  Procedure Laterality Date  . s/p left knee surgury     ACL repair  . TONSILLECTOMY      Social History   Socioeconomic History  . Marital status: Married    Spouse name: Not on file  . Number of children: 4  . Years of education: 5  . Highest education level: Not on file  Occupational History  . Occupation: Photographer business  Social Needs  . Financial resource strain: Not on file  . Food insecurity:    Worry: Not on file    Inability: Not on file  . Transportation needs:    Medical: Not on file    Non-medical: Not on file  Tobacco Use  . Smoking status: Current Every Day Smoker    Packs/day: 1.00    Years: 30.00    Pack years: 30.00    Types: Cigarettes  . Smokeless tobacco: Never Used  Substance and Sexual Activity  . Alcohol use: No  . Drug use: No  . Sexual activity: Not on file  Lifestyle  . Physical activity:    Days per week: Not on file    Minutes per session: Not on file  . Stress: Not on file  Relationships  . Social connections:    Talks on phone: Not on file    Gets together: Not on file    Attends religious service: Not on file    Active member of club or organization: Not on file    Attends meetings of clubs or organizations: Not on file    Relationship status: Not on file  . Intimate partner violence:    Fear of current or ex partner: Not on file    Emotionally abused: Not on file    Physically abused: Not on file    Forced sexual activity: Not on file  Other Topics Concern  . Not on file  Social History Narrative   Born and raised in [place], [place]. Raised [place]. Currently resides in house with his wife and daughter. Fun: Yard work, baseball.   Denies religious beliefs effecting health care.     Current Outpatient Medications on File Prior to Visit  Medication Sig Dispense Refill  . glucose blood (ONE TOUCH ULTRA TEST) test strip 1 each by Other route 2 (two) times daily. And lancets 2/day. 180 each 3  . Insulin Glargine (LANTUS SOLOSTAR) 100 UNIT/ML Solostar Pen Inject 55 Units into the skin every morning. 10 pen 11  . INVOKANA 100 MG TABS tablet TAKE 1 TABLET (100 MG TOTAL) BY MOUTH DAILY BEFORE BREAKFAST.  30 tablet 11  . Lancet Devices (ONE TOUCH DELICA LANCING DEV) MISC Used to check blood sugars. 1 each 0  . metFORMIN (GLUCOPHAGE-XR) 500 MG 24 hr tablet Take 2 tablets (1,000 mg total) by mouth every morning. 180 tablet 3  . pravastatin (PRAVACHOL) 80 MG tablet TAKE 1 TABLET BY MOUTH EVERY DAY 90 tablet 3  . tadalafil (CIALIS) 10 MG tablet Take 1 tablet (10 mg total) by mouth daily as needed for erectile dysfunction. 10 tablet 11   No current facility-administered medications on file prior to visit.     No Known Allergies  Family History  Problem Relation Age of Onset  . Arthritis Other   . Hyperlipidemia Other   . Heart disease Other   . Hypertension Other   . Diabetes Other     BP 124/80   Pulse 90   Ht 5\' 9"  (1.753 m)   Wt 218 lb (98.9 kg)   SpO2 96%   BMI 32.19 kg/m   Review of Systems He denies hypoglycemia.      Objective:     Physical Exam VITAL SIGNS:  See vs page GENERAL: no distress Pulses: dorsalis pedis intact bilat.   MSK: no deformity of the feet CV: 1+ bilat leg edema Skin:  no ulcer on the feet.  normal color and temp on the feet.  Neuro: sensation is intact to touch on the feet.   Ext: There is bilateral onychomycosis of the toenails.     Lab Results  Component Value Date   HGBA1C 7.2 (A) 07/18/2018      Assessment & Plan:  Insulin-requiring type 2 DM: this is the best control this pt should aim for, given this regimen, which does match insulin to his changing needs throughout the day.  Edema: worse occupational status: he needs to adjust insulin for this  Patient Instructions  check your blood sugar twice a day.  vary the time of day when you check, between before the 3 meals, and at bedtime.  also check if you have symptoms of your blood sugar being too high or too low.  please keep a record of the readings and bring it to your next appointment here (or you can bring the meter itself).  You can write it on any piece of paper.  please call us sooner if your blood sugar goes below 70, or if you have a lot of readings over 200.  Please continue the same insulin: 55 units each morning on work days, and 50 units on off days.   Please reduce the pioglitazone to 15 mg daily Please continue the same metformin.  Please come back for a follow-up appointment in 4-5 months.

## 2018-08-05 ENCOUNTER — Other Ambulatory Visit: Payer: Self-pay | Admitting: Endocrinology

## 2018-09-28 DIAGNOSIS — M542 Cervicalgia: Secondary | ICD-10-CM | POA: Diagnosis not present

## 2018-12-19 ENCOUNTER — Ambulatory Visit: Payer: BLUE CROSS/BLUE SHIELD | Admitting: Endocrinology

## 2018-12-19 ENCOUNTER — Encounter: Payer: Self-pay | Admitting: Endocrinology

## 2018-12-19 VITALS — BP 140/70 | HR 106 | Ht 69.0 in | Wt 215.4 lb

## 2018-12-19 DIAGNOSIS — Z794 Long term (current) use of insulin: Secondary | ICD-10-CM

## 2018-12-19 DIAGNOSIS — E119 Type 2 diabetes mellitus without complications: Secondary | ICD-10-CM | POA: Diagnosis not present

## 2018-12-19 LAB — POCT GLYCOSYLATED HEMOGLOBIN (HGB A1C): HEMOGLOBIN A1C: 6.9 % — AB (ref 4.0–5.6)

## 2018-12-19 MED ORDER — PIOGLITAZONE HCL 15 MG PO TABS: 15.0000 mg | ORAL_TABLET | Freq: Every day | ORAL | 3 refills | Status: DC

## 2018-12-19 NOTE — Progress Notes (Signed)
Subjective:    Patient ID: Wayne Diaz, male    DOB: 1961-06-30, 58 y.o.   MRN: [id number]  HPI Pt returns for f/u of diabetes mellitus: DM type: Insulin-requiring type 2 Dx'ed: 2002 Complications: none Therapy: insulin since 2010 (and 3 oral meds).  DKA: never.  Severe hypoglycemia: never.  Pancreatitis: never.   Other: he declines multiple daily injections.   Interval history: Meter is downloaded today, and the printout is scanned into the record. All are checked in the morning.  It varies from 109-201.  It is higher on work days, despite higher insulin dosage then.  He says he never misses the insulin.  He has not recently seen eye dr.   Past Medical History:  Diagnosis Date  . ALLERGIC RHINITIS 05/05/2008  . ASTHMA 05/05/2008  . DIABETES MELLITUS, TYPE II 04/28/2008  . GERD 05/05/2008  . HYPERLIPIDEMIA 05/05/2008  . HYPERSOMNIA 03/10/2009  . SMOKER 03/10/2009    Past Surgical History:  Procedure Laterality Date  . s/p left knee surgury     ACL repair  . TONSILLECTOMY      Social History   Socioeconomic History  . Marital status: Married    Spouse name: Not on file  . Number of children: 4  . Years of education: 21  . Highest education level: Not on file  Occupational History  . Occupation: Photographer business  Social Needs  . Financial resource strain: Not on file  . Food insecurity:    Worry: Not on file    Inability: Not on file  . Transportation needs:    Medical: Not on file    Non-medical: Not on file  Tobacco Use  . Smoking status: Current Every Day Smoker    Packs/day: 1.00    Years: 30.00    Pack years: 30.00    Types: Cigarettes  . Smokeless tobacco: Never Used  Substance and Sexual Activity  . Alcohol use: No  . Drug use: No  . Sexual activity: Not on file  Lifestyle  . Physical activity:    Days per week: Not on file    Minutes per session: Not on file  . Stress: Not on file  Relationships  . Social connections:    Talks on phone: Not on file   Gets together: Not on file    Attends religious service: Not on file    Active member of club or organization: Not on file    Attends meetings of clubs or organizations: Not on file    Relationship status: Not on file  . Intimate partner violence:    Fear of current or ex partner: Not on file    Emotionally abused: Not on file    Physically abused: Not on file    Forced sexual activity: Not on file  Other Topics Concern  . Not on file  Social History Narrative   Born and raised in [place], [place]. Raised [place]. Currently resides in house with his wife and daughter. Fun: Yard work, baseball.   Denies religious beliefs effecting health care.     Current Outpatient Medications on File Prior to Visit  Medication Sig Dispense Refill  . glucose blood (ONE TOUCH ULTRA TEST) test strip 1 each by Other route 2 (two) times daily. And lancets 2/day. 180 each 3  . Insulin Glargine (LANTUS SOLOSTAR) 100 UNIT/ML Solostar Pen Inject 55 Units into the skin every morning. (Patient taking differently: Inject 55 Units into the skin every morning. Take 55 units through  the week and 50 and the weekend) 10 pen 11  . INVOKANA 100 MG TABS tablet TAKE 1 TABLET (100 MG TOTAL) BY MOUTH DAILY BEFORE BREAKFAST. 30 tablet 11  . Lancet Devices (ONE TOUCH DELICA LANCING DEV) MISC Used to check blood sugars. 1 each 0  . metFORMIN (GLUCOPHAGE-XR) 500 MG 24 hr tablet Take 2 tablets (1,000 mg total) by mouth every morning. 180 tablet 3  . tadalafil (CIALIS) 10 MG tablet Take 1 tablet (10 mg total) by mouth daily as needed for erectile dysfunction. 10 tablet 11   No current facility-administered medications on file prior to visit.     No Known Allergies  Family History  Problem Relation Age of Onset  . Arthritis Other   . Hyperlipidemia Other   . Heart disease Other   . Hypertension Other   . Diabetes Other     BP 140/70 (BP Location: Right Arm, Patient Position: Sitting, Cuff Size: Normal)   Pulse  (!) 106   Ht 5\' 9"  (1.753 m)   Wt 215 lb 6.4 oz (97.7 kg)   SpO2 95%   BMI 31.81 kg/m    Review of Systems He denies hypoglycemia.      Objective:   Physical Exam VITAL SIGNS:  See vs page GENERAL: no distress Pulses: dorsalis pedis intact bilat.   MSK: no deformity of the feet CV: trace bilat leg edema Skin:  no ulcer on the feet.  normal color and temp on the feet.  Neuro: sensation is intact to touch on the feet.   Ext: There is bilateral onychomycosis of the toenails.   A1c=6.9%     Assessment & Plan:  Insulin-requiring type 2 DM: overcontrolled, given this insulin regimen, which does match insulin to her changing needs throughout the day edema: improved on reduced pioglitazone. Occupational status: he needs to adjust insulin for this.  Patient Instructions  check your blood sugar twice a day.  vary the time of day when you check, between before the 3 meals, and at bedtime.  also check if you have symptoms of your blood sugar being too high or too low.  please keep a record of the readings and bring it to your next appointment here (or you can bring the meter itself).  You can write it on any piece of paper.  please call us sooner if your blood sugar goes below 70, or if you have a lot of readings over 200.  Please take 55 units each morning on work days, and 45 units on off days.   Please see an eye doctor, as it has been a few years.  Please continue the same pioglitazone:15 mg daily.  Please continue the same metformin and invokana.   Please come back for a follow-up appointment in 4 months.

## 2018-12-19 NOTE — Patient Instructions (Addendum)
check your blood sugar twice a day.  vary the time of day when you check, between before the 3 meals, and at bedtime.  also check if you have symptoms of your blood sugar being too high or too low.  please keep a record of the readings and bring it to your next appointment here (or you can bring the meter itself).  You can write it on any piece of paper.  please call us sooner if your blood sugar goes below 70, or if you have a lot of readings over 200.  Please take 55 units each morning on work days, and 45 units on off days.   Please see an eye doctor, as it has been a few years.  Please continue the same pioglitazone:15 mg daily.  Please continue the same metformin and invokana.   Please come back for a follow-up appointment in 4 months.

## 2018-12-22 ENCOUNTER — Telehealth: Payer: Self-pay

## 2018-12-22 NOTE — Telephone Encounter (Signed)
Okay to schedule a new patient visit.

## 2018-12-22 NOTE — Telephone Encounter (Signed)
Pt referred by Dr. Everardo All, please advise.

## 2018-12-22 NOTE — Telephone Encounter (Signed)
Please call Pt- okay to schedule NP appt w/ Dr. Drue Novel at his convenience.

## 2018-12-22 NOTE — Telephone Encounter (Signed)
Copied from CRM [id number]. Topic: Appointment Scheduling - New Patient >> Dec 19, 2018  4:42 PM Baldo Daub L wrote: Pt calling to see if he can be seen by Dr. Drue Novel.  Pt states that he was given Dr. Leta Jungling name by Dr. Everardo All. Pt can be reached at [phone] or [phone]

## 2018-12-22 NOTE — Telephone Encounter (Signed)
Left msg with patients wife to inform him to cal in an make an appt with Dr. Drue Novel

## 2018-12-29 DIAGNOSIS — M542 Cervicalgia: Secondary | ICD-10-CM | POA: Diagnosis not present

## 2019-01-06 DIAGNOSIS — M542 Cervicalgia: Secondary | ICD-10-CM | POA: Diagnosis not present

## 2019-01-19 DIAGNOSIS — M542 Cervicalgia: Secondary | ICD-10-CM | POA: Diagnosis not present

## 2019-03-10 ENCOUNTER — Other Ambulatory Visit: Payer: Self-pay | Admitting: Endocrinology

## 2019-03-15 ENCOUNTER — Other Ambulatory Visit: Payer: Self-pay | Admitting: Endocrinology

## 2019-04-14 ENCOUNTER — Other Ambulatory Visit: Payer: Self-pay

## 2019-04-17 ENCOUNTER — Other Ambulatory Visit: Payer: Self-pay

## 2019-04-17 ENCOUNTER — Telehealth: Payer: Self-pay | Admitting: Endocrinology

## 2019-04-17 ENCOUNTER — Encounter: Payer: Self-pay | Admitting: Endocrinology

## 2019-04-17 ENCOUNTER — Ambulatory Visit: Payer: BC Managed Care – PPO | Admitting: Endocrinology

## 2019-04-17 VITALS — BP 132/82 | HR 94 | Temp 98.4°F | Wt 198.8 lb

## 2019-04-17 DIAGNOSIS — Z794 Long term (current) use of insulin: Secondary | ICD-10-CM | POA: Diagnosis not present

## 2019-04-17 DIAGNOSIS — E119 Type 2 diabetes mellitus without complications: Secondary | ICD-10-CM

## 2019-04-17 LAB — POCT GLYCOSYLATED HEMOGLOBIN (HGB A1C): Hemoglobin A1C: 7.1 % — AB (ref 4.0–5.6)

## 2019-04-17 MED ORDER — LANTUS SOLOSTAR 100 UNIT/ML ~~LOC~~ SOPN: 40.0000 [IU] | PEN_INJECTOR | SUBCUTANEOUS | 3 refills | Status: DC

## 2019-04-17 MED ORDER — JANUMET XR 50-1000 MG PO TB24: 2.0000 | ORAL_TABLET | ORAL | 3 refills | Status: DC

## 2019-04-17 MED ORDER — VARDENAFIL HCL 20 MG PO TABS: 20.0000 mg | ORAL_TABLET | Freq: Every day | ORAL | 11 refills | Status: DC | PRN

## 2019-04-17 NOTE — Telephone Encounter (Signed)
Patient states they do cover the generic (vardenfil hsl)  Please Advise, Thanks

## 2019-04-17 NOTE — Telephone Encounter (Signed)
Fine with me to fill as generic.

## 2019-04-17 NOTE — Patient Instructions (Addendum)
Please take the insulin, 40 units each morning. check your blood sugar twice a day.  vary the time of day when you check, between before the 3 meals, and at bedtime.  also check if you have symptoms of your blood sugar being too high or too low.  please keep a record of the readings and bring it to your next appointment here (or you can bring the meter itself).  You can write it on any piece of paper.  please call us sooner if your blood sugar goes below 70, or if you have a lot of readings over 200. I have sent a prescription to your pharmacy, to change the metformin back to janumet.  I have also sent a prescription to your pharmacy, to change cialis to levitra.  Please come back for a follow-up appointment in 3 months.

## 2019-04-17 NOTE — Telephone Encounter (Signed)
Called pt and advised to inquire with insurance company to determine what ED medication is covered by his plan. Advised to call with information. Rx can be changed based on insurance coverage.

## 2019-04-17 NOTE — Progress Notes (Signed)
Subjective:    Patient ID: Wayne Diaz, male    DOB: 17-Oct-1961, 58 y.o.   MRN: [id number]  HPI Pt returns for f/u of diabetes mellitus: DM type: Insulin-requiring type 2 Dx'ed: 8144 Complications: none Therapy: insulin since 2010 (and 4 oral meds).  DKA: never.  Severe hypoglycemia: never.  Pancreatitis: never.   Other: he declines multiple daily injections.   Interval history: He brings a record of his cbg's which I have reviewed today.  It varies from 115-220.  He varies the insulin from 35-45 units qamIt is higher on work days, despite higher insulin dosage then.  He says he never misses the insulin. He says the janumet was changed back to metformin 2 weeks.   Past Medical History:  Diagnosis Date  . ALLERGIC RHINITIS 05/05/2008  . ASTHMA 05/05/2008  . DIABETES MELLITUS, TYPE II 04/28/2008  . GERD 05/05/2008  . HYPERLIPIDEMIA 05/05/2008  . HYPERSOMNIA 03/10/2009  . SMOKER 03/10/2009    Past Surgical History:  Procedure Laterality Date  . s/p left knee surgury     ACL repair  . TONSILLECTOMY      Social History   Socioeconomic History  . Marital status: Married    Spouse name: Not on file  . Number of children: 4  . Years of education: 44  . Highest education level: Not on file  Occupational History  . Occupation: Science writer business  Social Needs  . Financial resource strain: Not on file  . Food insecurity    Worry: Not on file    Inability: Not on file  . Transportation needs    Medical: Not on file    Non-medical: Not on file  Tobacco Use  . Smoking status: Current Every Day Smoker    Packs/day: 1.00    Years: 30.00    Pack years: 30.00    Types: Cigarettes  . Smokeless tobacco: Never Used  Substance and Sexual Activity  . Alcohol use: No  . Drug use: No  . Sexual activity: Not on file  Lifestyle  . Physical activity    Days per week: Not on file    Minutes per session: Not on file  . Stress: Not on file  Relationships  . Social Herbalist on  phone: Not on file    Gets together: Not on file    Attends religious service: Not on file    Active member of club or organization: Not on file    Attends meetings of clubs or organizations: Not on file    Relationship status: Not on file  . Intimate partner violence    Fear of current or ex partner: Not on file    Emotionally abused: Not on file    Physically abused: Not on file    Forced sexual activity: Not on file  Other Topics Concern  . Not on file  Social History Narrative   Born and raised in [place], [place]. Raised [place]. Currently resides in house with his wife and daughter. Fun: Yard work, baseball.   Denies religious beliefs effecting health care.     Current Outpatient Medications on File Prior to Visit  Medication Sig Dispense Refill  . glucose blood (ONE TOUCH ULTRA TEST) test strip 1 each by Other route 2 (two) times daily. And lancets 2/day. 180 each 3  . INVOKANA 100 MG TABS tablet TAKE 1 TABLET (100 MG TOTAL) BY MOUTH DAILY BEFORE BREAKFAST. 30 tablet 11  . Lancet Devices (ONE  TOUCH DELICA LANCING DEV) MISC Used to check blood sugars. 1 each 0  . pioglitazone (ACTOS) 15 MG tablet Take 1 tablet (15 mg total) by mouth daily. 90 tablet 3   No current facility-administered medications on file prior to visit.     No Known Allergies  Family History  Problem Relation Age of Onset  . Arthritis Other   . Hyperlipidemia Other   . Heart disease Other   . Hypertension Other   . Diabetes Other     BP 132/82 (BP Location: Right Arm, Patient Position: Sitting, Cuff Size: Normal)   Pulse 94   Temp 98.4 F (36.9 C) (Oral)   Wt 198 lb 12.8 oz (90.2 kg)   SpO2 97%   BMI 29.36 kg/m   Review of Systems He denies hypoglycemia.  He says cialis no longer works.      Objective:   Physical Exam VITAL SIGNS:  See vs page GENERAL: no distress Pulses: dorsalis pedis intact bilat.   MSK: no deformity of the feet CV: 1+ bilat leg edema Skin:  no ulcer on the  feet.  normal color and temp on the feet. Neuro: sensation is intact to touch on the feet   Lab Results  Component Value Date   HGBA1C 7.1 (A) 04/17/2019       Assessment & Plan:  Insulin-requiring type 2 DM: this is the best control this pt should aim for, given this regimen, which does match insulin to his changing needs throughout the day Edema: This limits rx options ED: worse  Patient Instructions  Please take the insulin, 40 units each morning. check your blood sugar twice a day.  vary the time of day when you check, between before the 3 meals, and at bedtime.  also check if you have symptoms of your blood sugar being too high or too low.  please keep a record of the readings and bring it to your next appointment here (or you can bring the meter itself).  You can write it on any piece of paper.  please call us sooner if your blood sugar goes below 70, or if you have a lot of readings over 200. I have sent a prescription to your pharmacy, to change the metformin back to janumet.  I have also sent a prescription to your pharmacy, to change cialis to levitra.  Please come back for a follow-up appointment in 3 months.

## 2019-04-17 NOTE — Telephone Encounter (Signed)
Please review pt request to change to generic vardenfil

## 2019-04-17 NOTE — Telephone Encounter (Signed)
Patient called to advise that pharmacy stated that the Levitra needs a prior authorization.  Patient states needs to know how to proceed, contact is [phone]

## 2019-04-20 NOTE — Telephone Encounter (Signed)
vardenafil (LEVITRA) 20 MG tablet 10 tablet 11 04/17/2019    Sig - Route: Take 1 tablet (20 mg total) by mouth daily as needed for erectile dysfunction. - Oral   Sent to pharmacy as: vardenafil (LEVITRA) 20 MG tablet   E-Prescribing Status: Receipt confirmed by pharmacy (04/17/2019 9:12 AM EDT)

## 2019-04-22 ENCOUNTER — Telehealth: Payer: Self-pay | Admitting: Endocrinology

## 2019-04-22 NOTE — Telephone Encounter (Signed)
Patient called to advise that he is required yo have prior authorization for Vardenafil.  Contact number for PA is [phone]   Patients contact number is [phone], patient working from home. If he does not answer call back in 30 minutes

## 2019-04-23 ENCOUNTER — Other Ambulatory Visit: Payer: Self-pay

## 2019-04-23 DIAGNOSIS — N529 Male erectile dysfunction, unspecified: Secondary | ICD-10-CM

## 2019-04-23 MED ORDER — VARDENAFIL HCL 20 MG PO TABS: 20.0000 mg | ORAL_TABLET | Freq: Every day | ORAL | 11 refills | Status: DC | PRN

## 2019-04-23 NOTE — Telephone Encounter (Signed)
PA initiated TODAY through Cover My Meds for VARDENAFIL. Will await insurance response re: approval/denialMEHKAI GALLO (Key: [id number])  Your information has been submitted to [place]. [employer] [place] will review the request and fax you a determination directly, typically within 3 business days of your submission once all necessary information is received.  If [employer] has not responded in 3 business days or if you have any questions about your submission, contact [facility] at [phone].  ANDREN BETHEA Key: [id number] - Rx #: [id number] Need help? Call us at [phone] Status Sent to [facility] Vardenafil HCl 20MG  tablets Form Blue Building control surveyor Form (CB) Original Claim Info 76,75 RESTRICTED ACCESS DRUG--PA REQUIREDMAX 6 QUANTITY IN 30 DAYSMAXIMUM DAILY DOSE OF .200000

## 2019-04-27 DIAGNOSIS — M9902 Segmental and somatic dysfunction of thoracic region: Secondary | ICD-10-CM | POA: Diagnosis not present

## 2019-04-27 DIAGNOSIS — M5384 Other specified dorsopathies, thoracic region: Secondary | ICD-10-CM | POA: Diagnosis not present

## 2019-04-27 DIAGNOSIS — M62830 Muscle spasm of back: Secondary | ICD-10-CM | POA: Diagnosis not present

## 2019-04-29 DIAGNOSIS — M9902 Segmental and somatic dysfunction of thoracic region: Secondary | ICD-10-CM | POA: Diagnosis not present

## 2019-04-29 DIAGNOSIS — M62830 Muscle spasm of back: Secondary | ICD-10-CM | POA: Diagnosis not present

## 2019-04-29 DIAGNOSIS — M5384 Other specified dorsopathies, thoracic region: Secondary | ICD-10-CM | POA: Diagnosis not present

## 2019-05-01 DIAGNOSIS — M9902 Segmental and somatic dysfunction of thoracic region: Secondary | ICD-10-CM | POA: Diagnosis not present

## 2019-05-01 DIAGNOSIS — M9901 Segmental and somatic dysfunction of cervical region: Secondary | ICD-10-CM | POA: Diagnosis not present

## 2019-05-01 DIAGNOSIS — M62830 Muscle spasm of back: Secondary | ICD-10-CM | POA: Diagnosis not present

## 2019-05-01 DIAGNOSIS — M5384 Other specified dorsopathies, thoracic region: Secondary | ICD-10-CM | POA: Diagnosis not present

## 2019-05-04 DIAGNOSIS — M9901 Segmental and somatic dysfunction of cervical region: Secondary | ICD-10-CM | POA: Diagnosis not present

## 2019-05-04 DIAGNOSIS — M62830 Muscle spasm of back: Secondary | ICD-10-CM | POA: Diagnosis not present

## 2019-05-04 DIAGNOSIS — M5384 Other specified dorsopathies, thoracic region: Secondary | ICD-10-CM | POA: Diagnosis not present

## 2019-05-04 DIAGNOSIS — M9902 Segmental and somatic dysfunction of thoracic region: Secondary | ICD-10-CM | POA: Diagnosis not present

## 2019-05-06 DIAGNOSIS — M62830 Muscle spasm of back: Secondary | ICD-10-CM | POA: Diagnosis not present

## 2019-05-06 DIAGNOSIS — M9901 Segmental and somatic dysfunction of cervical region: Secondary | ICD-10-CM | POA: Diagnosis not present

## 2019-05-06 DIAGNOSIS — M5384 Other specified dorsopathies, thoracic region: Secondary | ICD-10-CM | POA: Diagnosis not present

## 2019-05-06 DIAGNOSIS — M9902 Segmental and somatic dysfunction of thoracic region: Secondary | ICD-10-CM | POA: Diagnosis not present

## 2019-05-06 NOTE — Telephone Encounter (Signed)
Received notification from [facility] that PA for Vardenafil is not required as this is an approved medication on pt plan for NO MORE than 6 tablets/30days. PA request was canceled since medication is an approved medication on pt plan.

## 2019-05-11 DIAGNOSIS — M5384 Other specified dorsopathies, thoracic region: Secondary | ICD-10-CM | POA: Diagnosis not present

## 2019-05-11 DIAGNOSIS — M62830 Muscle spasm of back: Secondary | ICD-10-CM | POA: Diagnosis not present

## 2019-05-11 DIAGNOSIS — M9902 Segmental and somatic dysfunction of thoracic region: Secondary | ICD-10-CM | POA: Diagnosis not present

## 2019-05-11 DIAGNOSIS — M9901 Segmental and somatic dysfunction of cervical region: Secondary | ICD-10-CM | POA: Diagnosis not present

## 2019-05-13 DIAGNOSIS — M9902 Segmental and somatic dysfunction of thoracic region: Secondary | ICD-10-CM | POA: Diagnosis not present

## 2019-05-13 DIAGNOSIS — M9901 Segmental and somatic dysfunction of cervical region: Secondary | ICD-10-CM | POA: Diagnosis not present

## 2019-05-13 DIAGNOSIS — M62830 Muscle spasm of back: Secondary | ICD-10-CM | POA: Diagnosis not present

## 2019-05-13 DIAGNOSIS — M5384 Other specified dorsopathies, thoracic region: Secondary | ICD-10-CM | POA: Diagnosis not present

## 2019-05-20 DIAGNOSIS — M5384 Other specified dorsopathies, thoracic region: Secondary | ICD-10-CM | POA: Diagnosis not present

## 2019-05-20 DIAGNOSIS — M9901 Segmental and somatic dysfunction of cervical region: Secondary | ICD-10-CM | POA: Diagnosis not present

## 2019-05-20 DIAGNOSIS — M9902 Segmental and somatic dysfunction of thoracic region: Secondary | ICD-10-CM | POA: Diagnosis not present

## 2019-05-20 DIAGNOSIS — M62830 Muscle spasm of back: Secondary | ICD-10-CM | POA: Diagnosis not present

## 2019-07-24 ENCOUNTER — Other Ambulatory Visit: Payer: Self-pay

## 2019-07-24 ENCOUNTER — Encounter: Payer: Self-pay | Admitting: Endocrinology

## 2019-07-24 ENCOUNTER — Ambulatory Visit: Payer: BC Managed Care – PPO | Admitting: Endocrinology

## 2019-07-24 VITALS — BP 124/70 | HR 98 | Ht 69.0 in | Wt 204.8 lb

## 2019-07-24 DIAGNOSIS — E119 Type 2 diabetes mellitus without complications: Secondary | ICD-10-CM | POA: Diagnosis not present

## 2019-07-24 DIAGNOSIS — Z794 Long term (current) use of insulin: Secondary | ICD-10-CM | POA: Diagnosis not present

## 2019-07-24 LAB — POCT GLYCOSYLATED HEMOGLOBIN (HGB A1C): Hemoglobin A1C: 7.2 % — AB (ref 4.0–5.6)

## 2019-07-24 NOTE — Patient Instructions (Addendum)
Please stop taking the pioglitazone, and:  continue the same other diabetes medications.   check your blood sugar twice a day.  vary the time of day when you check, between before the 3 meals, and at bedtime.  also check if you have symptoms of your blood sugar being too high or too low.  please keep a record of the readings and bring it to your next appointment here (or you can bring the meter itself).  You can write it on any piece of paper.  please call us sooner if your blood sugar goes below 70, or if you have a lot of readings over 200. Please come back for a follow-up appointment in 3-4 months.

## 2019-07-24 NOTE — Progress Notes (Signed)
Subjective:    Patient ID: Wayne Diaz, male    DOB: 1961-09-29, 58 y.o.   MRN: [id number]  HPI Pt returns for f/u of diabetes mellitus: DM type: Insulin-requiring type 2 Dx'ed: 2703 Complications: none Therapy: insulin since 2010 (and 4 oral meds).  DKA: never.  Severe hypoglycemia: never.  Pancreatitis: never.   Other: he declines multiple daily injections.   Interval history: no cbg record, but states cbg varies from 95-225.  He says he never misses the insulin.  pt states he feels well in general.   Past Medical History:  Diagnosis Date  . ALLERGIC RHINITIS 05/05/2008  . ASTHMA 05/05/2008  . DIABETES MELLITUS, TYPE II 04/28/2008  . GERD 05/05/2008  . HYPERLIPIDEMIA 05/05/2008  . HYPERSOMNIA 03/10/2009  . SMOKER 03/10/2009    Past Surgical History:  Procedure Laterality Date  . s/p left knee surgury     ACL repair  . TONSILLECTOMY      Social History   Socioeconomic History  . Marital status: Married    Spouse name: Not on file  . Number of children: 4  . Years of education: 6  . Highest education level: Not on file  Occupational History  . Occupation: Science writer business  Social Needs  . Financial resource strain: Not on file  . Food insecurity    Worry: Not on file    Inability: Not on file  . Transportation needs    Medical: Not on file    Non-medical: Not on file  Tobacco Use  . Smoking status: Current Every Day Smoker    Packs/day: 1.00    Years: 30.00    Pack years: 30.00    Types: Cigarettes  . Smokeless tobacco: Never Used  Substance and Sexual Activity  . Alcohol use: No  . Drug use: No  . Sexual activity: Not on file  Lifestyle  . Physical activity    Days per week: Not on file    Minutes per session: Not on file  . Stress: Not on file  Relationships  . Social Herbalist on phone: Not on file    Gets together: Not on file    Attends religious service: Not on file    Active member of club or organization: Not on file    Attends  meetings of clubs or organizations: Not on file    Relationship status: Not on file  . Intimate partner violence    Fear of current or ex partner: Not on file    Emotionally abused: Not on file    Physically abused: Not on file    Forced sexual activity: Not on file  Other Topics Concern  . Not on file  Social History Narrative   Born and raised in [place], [place]. Raised [place]. Currently resides in house with his wife and daughter. Fun: Yard work, baseball.   Denies religious beliefs effecting health care.     Current Outpatient Medications on File Prior to Visit  Medication Sig Dispense Refill  . glucose blood (ONE TOUCH ULTRA TEST) test strip 1 each by Other route 2 (two) times daily. And lancets 2/day. 180 each 3  . Insulin Glargine (LANTUS SOLOSTAR) 100 UNIT/ML Solostar Pen Inject 40 Units into the skin every morning. 15 pen 3  . INVOKANA 100 MG TABS tablet TAKE 1 TABLET (100 MG TOTAL) BY MOUTH DAILY BEFORE BREAKFAST. 30 tablet 11  . Lancet Devices (ONE TOUCH DELICA LANCING DEV) MISC Used to check blood  sugars. 1 each 0  . SitaGLIPtin-MetFORMIN HCl (JANUMET XR) 50-1000 MG TB24 Take 2 tablets by mouth every morning. 180 tablet 3  . vardenafil (LEVITRA) 20 MG tablet Take 1 tablet (20 mg total) by mouth daily as needed for erectile dysfunction. 6 tablet 11   No current facility-administered medications on file prior to visit.     No Known Allergies  Family History  Problem Relation Age of Onset  . Arthritis Other   . Hyperlipidemia Other   . Heart disease Other   . Hypertension Other   . Diabetes Other     BP 124/70 (BP Location: Left Arm, Patient Position: Sitting, Cuff Size: Normal)   Pulse 98   Ht 5\' 9"  (1.753 m)   Wt 204 lb 12.8 oz (92.9 kg)   SpO2 95%   BMI 30.24 kg/m   Review of Systems He denies hypoglycemia    Objective:   Physical Exam VITAL SIGNS:  See vs page GENERAL: no distress Pulses: dorsalis pedis intact bilat.   MSK: no deformity of  the feet CV: trace bilat leg edema.  Skin:  no ulcer on the feet.  normal color and temp on the feet. Neuro: sensation is intact to touch on the feet.    Lab Results  Component Value Date   HGBA1C 7.2 (A) 07/24/2019      Assessment & Plan:  Insulin-requiring type 2 DM: this is the best control this pt should aim for, given this regimen, which does match insulin to his changing needs throughout the day Edema, persistent.   Patient Instructions  Please stop taking the pioglitazone, and:  continue the same other diabetes medications.   check your blood sugar twice a day.  vary the time of day when you check, between before the 3 meals, and at bedtime.  also check if you have symptoms of your blood sugar being too high or too low.  please keep a record of the readings and bring it to your next appointment here (or you can bring the meter itself).  You can write it on any piece of paper.  please call us sooner if your blood sugar goes below 70, or if you have a lot of readings over 200. Please come back for a follow-up appointment in 3-4 months.

## 2019-08-07 ENCOUNTER — Other Ambulatory Visit: Payer: Self-pay | Admitting: Endocrinology

## 2019-10-03 DIAGNOSIS — J3489 Other specified disorders of nose and nasal sinuses: Secondary | ICD-10-CM | POA: Diagnosis not present

## 2019-10-03 DIAGNOSIS — Z20828 Contact with and (suspected) exposure to other viral communicable diseases: Secondary | ICD-10-CM | POA: Diagnosis not present

## 2019-10-04 DIAGNOSIS — Z20828 Contact with and (suspected) exposure to other viral communicable diseases: Secondary | ICD-10-CM | POA: Diagnosis not present

## 2019-10-21 ENCOUNTER — Other Ambulatory Visit: Payer: Self-pay

## 2019-10-23 ENCOUNTER — Ambulatory Visit: Payer: BC Managed Care – PPO | Admitting: Endocrinology

## 2019-10-23 ENCOUNTER — Encounter: Payer: Self-pay | Admitting: Endocrinology

## 2019-10-23 ENCOUNTER — Other Ambulatory Visit: Payer: Self-pay

## 2019-10-23 VITALS — BP 128/78 | HR 100 | Ht 69.0 in | Wt 202.2 lb

## 2019-10-23 DIAGNOSIS — Z794 Long term (current) use of insulin: Secondary | ICD-10-CM

## 2019-10-23 DIAGNOSIS — M7541 Impingement syndrome of right shoulder: Secondary | ICD-10-CM | POA: Diagnosis not present

## 2019-10-23 DIAGNOSIS — E119 Type 2 diabetes mellitus without complications: Secondary | ICD-10-CM

## 2019-10-23 DIAGNOSIS — M25511 Pain in right shoulder: Secondary | ICD-10-CM | POA: Diagnosis not present

## 2019-10-23 LAB — POCT GLYCOSYLATED HEMOGLOBIN (HGB A1C): Hemoglobin A1C: 7.4 % — AB (ref 4.0–5.6)

## 2019-10-23 MED ORDER — PIOGLITAZONE HCL 15 MG PO TABS: 15.0000 mg | ORAL_TABLET | Freq: Every day | ORAL | 3 refills | Status: DC

## 2019-10-23 NOTE — Patient Instructions (Signed)
continue the same diabetes medications.   check your blood sugar twice a day.  vary the time of day when you check, between before the 3 meals, and at bedtime.  also check if you have symptoms of your blood sugar being too high or too low.  please keep a record of the readings and bring it to your next appointment here (or you can bring the meter itself).  You can write it on any piece of paper.  please call us sooner if your blood sugar goes below 70, or if you have a lot of readings over 200. Please come back for a follow-up appointment in 3-4 months.

## 2019-10-23 NOTE — Progress Notes (Signed)
Subjective:    Patient ID: Wayne Diaz, male    DOB: [birth year], 58 y.o.   MRN: [id number]  HPI Pt returns for f/u of diabetes mellitus: DM type: Insulin-requiring type 2 Dx'ed: 2002 Complications: none Therapy: insulin since 2010 (and 4 oral meds).  DKA: never.  Severe hypoglycemia: never.  Pancreatitis: never.   Other: he declines multiple daily injections.   Interval history: no cbg record, but states cbg's are well-controlled.  He says he never misses the insulin.  pt states he feels well in general.  He resumed the pioglitazone, due to elev cbg's.   Past Medical History:  Diagnosis Date  . ALLERGIC RHINITIS 05/05/2008  . ASTHMA 05/05/2008  . DIABETES MELLITUS, TYPE II 04/28/2008  . GERD 05/05/2008  . HYPERLIPIDEMIA 05/05/2008  . HYPERSOMNIA 03/10/2009  . SMOKER 03/10/2009    Past Surgical History:  Procedure Laterality Date  . s/p left knee surgury     ACL repair  . TONSILLECTOMY      Social History   Socioeconomic History  . Marital status: Married    Spouse name: Not on file  . Number of children: 4  . Years of education: 49  . Highest education level: Not on file  Occupational History  . Occupation: Photographer business  Tobacco Use  . Smoking status: Current Every Day Smoker    Packs/day: 1.00    Years: 30.00    Pack years: 30.00    Types: Cigarettes  . Smokeless tobacco: Never Used  Substance and Sexual Activity  . Alcohol use: No  . Drug use: No  . Sexual activity: Not on file  Other Topics Concern  . Not on file  Social History Narrative   Born and raised in [place], [place]. Raised [place]. Currently resides in house with his wife and daughter. Fun: Yard work, baseball.   Denies religious beliefs effecting health care.    Social Determinants of Health   Financial Resource Strain:   . Difficulty of Paying Living Expenses: Not on file  Food Insecurity:   . Worried About Programme researcher, broadcasting/film/video in the Last Year: Not on file  . Ran Out of Food in the  Last Year: Not on file  Transportation Needs:   . Lack of Transportation (Medical): Not on file  . Lack of Transportation (Non-Medical): Not on file  Physical Activity:   . Days of Exercise per Week: Not on file  . Minutes of Exercise per Session: Not on file  Stress:   . Feeling of Stress : Not on file  Social Connections:   . Frequency of Communication with Friends and Family: Not on file  . Frequency of Social Gatherings with Friends and Family: Not on file  . Attends Religious Services: Not on file  . Active Member of Clubs or Organizations: Not on file  . Attends Banker Meetings: Not on file  . Marital Status: Not on file  Intimate Partner Violence:   . Fear of Current or Ex-Partner: Not on file  . Emotionally Abused: Not on file  . Physically Abused: Not on file  . Sexually Abused: Not on file    Current Outpatient Medications on File Prior to Visit  Medication Sig Dispense Refill  . glucose blood (ONE TOUCH ULTRA TEST) test strip 1 each by Other route 2 (two) times daily. And lancets 2/day. 180 each 3  . Insulin Glargine (LANTUS SOLOSTAR) 100 UNIT/ML Solostar Pen Inject 40 Units into the skin every morning.  15 pen 3  . INVOKANA 100 MG TABS tablet TAKE 1 TABLET (100 MG TOTAL) BY MOUTH DAILY BEFORE BREAKFAST. 30 tablet 2  . Lancet Devices (ONE TOUCH DELICA LANCING DEV) MISC Used to check blood sugars. 1 each 0  . SitaGLIPtin-MetFORMIN HCl (JANUMET XR) 50-1000 MG TB24 Take 2 tablets by mouth every morning. (Patient taking differently: Take 1 tablet by mouth 2 (two) times daily. ) 180 tablet 3  . vardenafil (LEVITRA) 20 MG tablet Take 1 tablet (20 mg total) by mouth daily as needed for erectile dysfunction. 6 tablet 11   No current facility-administered medications on file prior to visit.    No Known Allergies  Family History  Problem Relation Age of Onset  . Arthritis Other   . Hyperlipidemia Other   . Heart disease Other   . Hypertension Other   .  Diabetes Other     BP 128/78 (BP Location: Left Arm, Patient Position: Sitting, Cuff Size: Large)   Pulse 100   Ht 5\' 9"  (1.753 m)   Wt 202 lb 3.2 oz (91.7 kg)   SpO2 98%   BMI 29.86 kg/m    Review of Systems He denies hypoglycemia.     Objective:   Physical Exam VITAL SIGNS:  See vs page GENERAL: no distress Pulses: dorsalis pedis intact bilat.   MSK: no deformity of the feet CV: no leg edema Skin:  no ulcer on the feet.  normal color and temp on the feet. Neuro: sensation is intact to touch on the feet.   Ext: there is bilateral onychomycosis of the toenails.   Lab Results  Component Value Date   HGBA1C 7.4 (A) 10/23/2019      Assessment & Plan:  Insulin-requiring type 2 DM: this is the best control this pt should aim for, given this regimen, which does match insulin to hisr changing needs throughout the day Edema, due to pioglitazone.  Better, but he is at risk for recurrence, back on it  Patient Instructions  continue the same diabetes medications.   check your blood sugar twice a day.  vary the time of day when you check, between before the 3 meals, and at bedtime.  also check if you have symptoms of your blood sugar being too high or too low.  please keep a record of the readings and bring it to your next appointment here (or you can bring the meter itself).  You can write it on any piece of paper.  please call us sooner if your blood sugar goes below 70, or if you have a lot of readings over 200. Please come back for a follow-up appointment in 3-4 months.

## 2019-10-27 DIAGNOSIS — Z20828 Contact with and (suspected) exposure to other viral communicable diseases: Secondary | ICD-10-CM | POA: Diagnosis not present

## 2019-10-27 DIAGNOSIS — R4582 Worries: Secondary | ICD-10-CM | POA: Diagnosis not present

## 2019-11-18 DIAGNOSIS — M25511 Pain in right shoulder: Secondary | ICD-10-CM | POA: Diagnosis not present

## 2019-11-18 DIAGNOSIS — M7541 Impingement syndrome of right shoulder: Secondary | ICD-10-CM | POA: Diagnosis not present

## 2019-11-20 ENCOUNTER — Other Ambulatory Visit: Payer: Self-pay | Admitting: Endocrinology

## 2019-11-26 DIAGNOSIS — M25511 Pain in right shoulder: Secondary | ICD-10-CM | POA: Diagnosis not present

## 2019-12-07 DIAGNOSIS — M7541 Impingement syndrome of right shoulder: Secondary | ICD-10-CM | POA: Diagnosis not present

## 2019-12-07 DIAGNOSIS — M25511 Pain in right shoulder: Secondary | ICD-10-CM | POA: Diagnosis not present

## 2019-12-22 DIAGNOSIS — S46211A Strain of muscle, fascia and tendon of other parts of biceps, right arm, initial encounter: Secondary | ICD-10-CM | POA: Diagnosis not present

## 2019-12-22 DIAGNOSIS — G8911 Acute pain due to trauma: Secondary | ICD-10-CM | POA: Diagnosis not present

## 2019-12-22 DIAGNOSIS — S43491A Other sprain of right shoulder joint, initial encounter: Secondary | ICD-10-CM | POA: Diagnosis not present

## 2019-12-22 DIAGNOSIS — M7541 Impingement syndrome of right shoulder: Secondary | ICD-10-CM | POA: Diagnosis not present

## 2019-12-22 DIAGNOSIS — M659 Synovitis and tenosynovitis, unspecified: Secondary | ICD-10-CM | POA: Diagnosis not present

## 2019-12-22 DIAGNOSIS — M7551 Bursitis of right shoulder: Secondary | ICD-10-CM | POA: Diagnosis not present

## 2019-12-22 DIAGNOSIS — M19011 Primary osteoarthritis, right shoulder: Secondary | ICD-10-CM | POA: Diagnosis not present

## 2019-12-22 DIAGNOSIS — M25511 Pain in right shoulder: Secondary | ICD-10-CM | POA: Diagnosis not present

## 2019-12-22 DIAGNOSIS — R600 Localized edema: Secondary | ICD-10-CM | POA: Diagnosis not present

## 2019-12-22 DIAGNOSIS — M75111 Incomplete rotator cuff tear or rupture of right shoulder, not specified as traumatic: Secondary | ICD-10-CM | POA: Diagnosis not present

## 2019-12-22 DIAGNOSIS — S43432A Superior glenoid labrum lesion of left shoulder, initial encounter: Secondary | ICD-10-CM | POA: Diagnosis not present

## 2019-12-22 DIAGNOSIS — M65811 Other synovitis and tenosynovitis, right shoulder: Secondary | ICD-10-CM | POA: Diagnosis not present

## 2019-12-22 DIAGNOSIS — Z4889 Encounter for other specified surgical aftercare: Secondary | ICD-10-CM | POA: Diagnosis not present

## 2019-12-22 DIAGNOSIS — M67411 Ganglion, right shoulder: Secondary | ICD-10-CM | POA: Diagnosis not present

## 2019-12-24 ENCOUNTER — Other Ambulatory Visit: Payer: Self-pay | Admitting: Endocrinology

## 2020-01-01 DIAGNOSIS — M25511 Pain in right shoulder: Secondary | ICD-10-CM | POA: Diagnosis not present

## 2020-01-08 DIAGNOSIS — M25511 Pain in right shoulder: Secondary | ICD-10-CM | POA: Diagnosis not present

## 2020-01-15 DIAGNOSIS — M25511 Pain in right shoulder: Secondary | ICD-10-CM | POA: Diagnosis not present

## 2020-01-20 ENCOUNTER — Other Ambulatory Visit: Payer: Self-pay

## 2020-01-22 ENCOUNTER — Encounter: Payer: Self-pay | Admitting: Endocrinology

## 2020-01-22 ENCOUNTER — Other Ambulatory Visit: Payer: Self-pay

## 2020-01-22 ENCOUNTER — Ambulatory Visit: Payer: BC Managed Care – PPO | Admitting: Endocrinology

## 2020-01-22 VITALS — BP 100/60 | HR 98 | Ht 69.0 in | Wt 201.0 lb

## 2020-01-22 DIAGNOSIS — M25511 Pain in right shoulder: Secondary | ICD-10-CM | POA: Diagnosis not present

## 2020-01-22 DIAGNOSIS — E119 Type 2 diabetes mellitus without complications: Secondary | ICD-10-CM | POA: Diagnosis not present

## 2020-01-22 DIAGNOSIS — Z794 Long term (current) use of insulin: Secondary | ICD-10-CM | POA: Diagnosis not present

## 2020-01-22 LAB — POCT GLYCOSYLATED HEMOGLOBIN (HGB A1C): Hemoglobin A1C: 7.3 % — AB (ref 4.0–5.6)

## 2020-01-22 MED ORDER — LANTUS SOLOSTAR 100 UNIT/ML ~~LOC~~ SOPN: 30.0000 [IU] | PEN_INJECTOR | SUBCUTANEOUS | 3 refills | Status: DC

## 2020-01-22 MED ORDER — TRULICITY 0.75 MG/0.5ML ~~LOC~~ SOAJ: 0.7500 mg | SUBCUTANEOUS | 3 refills | Status: DC

## 2020-01-22 NOTE — Patient Instructions (Signed)
I have sent a prescription to your pharmacy, to add "Trulicity," and: Pleaser reduce the insulin to 30 units each morning, and: Please continue the same other medications check your blood sugar twice a day.  vary the time of day when you check, between before the 3 meals, and at bedtime.  also check if you have symptoms of your blood sugar being too high or too low.  please keep a record of the readings and bring it to your next appointment here (or you can bring the meter itself).  You can write it on any piece of paper.  please call us sooner if your blood sugar goes below 70, or if you have a lot of readings over 200. Please come back for a follow-up appointment in 2 months.

## 2020-01-22 NOTE — Progress Notes (Signed)
Subjective:    Patient ID: Wayne Diaz, male    DOB: 1961/04/16, 59 y.o.   MRN: [id number]  HPI Pt returns for f/u of diabetes mellitus: DM type: Insulin-requiring type 2 Dx'ed: 7124 Complications: none Therapy: insulin since 2010 (and 4 oral meds).  DKA: never.  Severe hypoglycemia: never.  Pancreatitis: never.   Other: he declines multiple daily injections.   Interval history: no cbg record, but states cbg's vary from 120-170.  He says he never misses the insulin.  pt states he feels well in general.  He averages approx 40 units qam.   Past Medical History:  Diagnosis Date  . ALLERGIC RHINITIS 05/05/2008  . ASTHMA 05/05/2008  . DIABETES MELLITUS, TYPE II 04/28/2008  . GERD 05/05/2008  . HYPERLIPIDEMIA 05/05/2008  . HYPERSOMNIA 03/10/2009  . SMOKER 03/10/2009    Past Surgical History:  Procedure Laterality Date  . s/p left knee surgury     ACL repair  . TONSILLECTOMY      Social History   Socioeconomic History  . Marital status: Married    Spouse name: Not on file  . Number of children: 4  . Years of education: 89  . Highest education level: Not on file  Occupational History  . Occupation: Science writer business  Tobacco Use  . Smoking status: Current Every Day Smoker    Packs/day: 1.00    Years: 30.00    Pack years: 30.00    Types: Cigarettes  . Smokeless tobacco: Never Used  Substance and Sexual Activity  . Alcohol use: No  . Drug use: No  . Sexual activity: Not on file  Other Topics Concern  . Not on file  Social History Narrative   Born and raised in [place], [place]. Raised [place] and [place]. Currently resides in house with his wife and daughter. Fun: Yard work, baseball.   Denies religious beliefs effecting health care.    Social Determinants of Health   Financial Resource Strain:   . Difficulty of Paying Living Expenses:   Food Insecurity:   . Worried About Charity fundraiser in the Last Year:   . Arboriculturist in the Last Year:   Transportation Needs:     . Film/video editor (Medical):   Marland Kitchen Lack of Transportation (Non-Medical):   Physical Activity:   . Days of Exercise per Week:   . Minutes of Exercise per Session:   Stress:   . Feeling of Stress :   Social Connections:   . Frequency of Communication with Friends and Family:   . Frequency of Social Gatherings with Friends and Family:   . Attends Religious Services:   . Active Member of Clubs or Organizations:   . Attends Archivist Meetings:   Marland Kitchen Marital Status:   Intimate Partner Violence:   . Fear of Current or Ex-Partner:   . Emotionally Abused:   Marland Kitchen Physically Abused:   . Sexually Abused:     Current Outpatient Medications on File Prior to Visit  Medication Sig Dispense Refill  . glucose blood (ONE TOUCH ULTRA TEST) test strip 1 each by Other route 2 (two) times daily. And lancets 2/day. 180 each 3  . INVOKANA 100 MG TABS tablet TAKE 1 TABLET (100 MG TOTAL) BY MOUTH DAILY BEFORE BREAKFAST. 30 tablet 2  . Lancet Devices (ONE TOUCH DELICA LANCING DEV) MISC Used to check blood sugars. 1 each 0  . pioglitazone (ACTOS) 15 MG tablet Take 1 tablet (15 mg total) by mouth  daily. 90 tablet 3  . SitaGLIPtin-MetFORMIN HCl (JANUMET XR) 50-1000 MG TB24 Take 2 tablets by mouth every morning. (Patient taking differently: Take 1 tablet by mouth 2 (two) times daily. ) 180 tablet 3  . vardenafil (LEVITRA) 20 MG tablet Take 1 tablet (20 mg total) by mouth daily as needed for erectile dysfunction. 6 tablet 11   No current facility-administered medications on file prior to visit.    No Known Allergies  Family History  Problem Relation Age of Onset  . Arthritis Other   . Hyperlipidemia Other   . Heart disease Other   . Hypertension Other   . Diabetes Other     BP 100/60   Pulse 98   Ht 5\' 9"  (1.753 m)   Wt 201 lb (91.2 kg)   SpO2 94%   BMI 29.68 kg/m   Review of Systems He denies hypoglycemia    Objective:   Physical Exam VITAL SIGNS:  See vs page.  GENERAL: no  distress.  Pulses: dorsalis pedis intact bilat.   MSK: no deformity of the feet.  CV: trace bilat leg edema.   Skin:  no ulcer on the feet.  normal color and temp on the feet.  Neuro: sensation is intact to touch on the feet.    Lab Results  Component Value Date   HGBA1C 7.3 (A) 01/22/2020   Lab Results  Component Value Date   CREATININE 1.00 03/08/2015   BUN 18 03/08/2015   NA 138 03/08/2015   K 4.2 03/08/2015   CL 103 03/08/2015   CO2 28 03/08/2015       Assessment & Plan:  Type 2 DM: he would benefit from increased rx, if it can be done with a regimen that avoids or minimizes hypoglycemia. Edema: This limits rx options Overweight: Trulicity might help.  Patient Instructions  I have sent a prescription to your pharmacy, to add "Trulicity," and: Pleaser reduce the insulin to 30 units each morning, and: Please continue the same other medications check your blood sugar twice a day.  vary the time of day when you check, between before the 3 meals, and at bedtime.  also check if you have symptoms of your blood sugar being too high or too low.  please keep a record of the readings and bring it to your next appointment here (or you can bring the meter itself).  You can write it on any piece of paper.  please call 05/08/2015 sooner if your blood sugar goes below 70, or if you have a lot of readings over 200. Please come back for a follow-up appointment in 2 months.

## 2020-01-28 DIAGNOSIS — M25511 Pain in right shoulder: Secondary | ICD-10-CM | POA: Diagnosis not present

## 2020-02-05 DIAGNOSIS — M25511 Pain in right shoulder: Secondary | ICD-10-CM | POA: Diagnosis not present

## 2020-02-08 DIAGNOSIS — M25511 Pain in right shoulder: Secondary | ICD-10-CM | POA: Diagnosis not present

## 2020-02-12 DIAGNOSIS — M25511 Pain in right shoulder: Secondary | ICD-10-CM | POA: Diagnosis not present

## 2020-02-16 DIAGNOSIS — M25511 Pain in right shoulder: Secondary | ICD-10-CM | POA: Diagnosis not present

## 2020-02-19 ENCOUNTER — Other Ambulatory Visit: Payer: Self-pay | Admitting: Endocrinology

## 2020-02-19 DIAGNOSIS — M25511 Pain in right shoulder: Secondary | ICD-10-CM | POA: Diagnosis not present

## 2020-02-21 ENCOUNTER — Other Ambulatory Visit: Payer: Self-pay | Admitting: Endocrinology

## 2020-02-23 DIAGNOSIS — M25511 Pain in right shoulder: Secondary | ICD-10-CM | POA: Diagnosis not present

## 2020-02-26 DIAGNOSIS — M25511 Pain in right shoulder: Secondary | ICD-10-CM | POA: Diagnosis not present

## 2020-03-01 DIAGNOSIS — M25511 Pain in right shoulder: Secondary | ICD-10-CM | POA: Diagnosis not present

## 2020-03-04 DIAGNOSIS — M25511 Pain in right shoulder: Secondary | ICD-10-CM | POA: Diagnosis not present

## 2020-03-11 DIAGNOSIS — M25511 Pain in right shoulder: Secondary | ICD-10-CM | POA: Diagnosis not present

## 2020-03-15 DIAGNOSIS — M25511 Pain in right shoulder: Secondary | ICD-10-CM | POA: Diagnosis not present

## 2020-03-18 DIAGNOSIS — M25511 Pain in right shoulder: Secondary | ICD-10-CM | POA: Diagnosis not present

## 2020-03-21 ENCOUNTER — Other Ambulatory Visit: Payer: Self-pay

## 2020-03-21 ENCOUNTER — Ambulatory Visit: Payer: Self-pay | Admitting: Podiatry

## 2020-03-21 ENCOUNTER — Ambulatory Visit (INDEPENDENT_AMBULATORY_CARE_PROVIDER_SITE_OTHER): Payer: BC Managed Care – PPO

## 2020-03-21 ENCOUNTER — Encounter: Payer: Self-pay | Admitting: Podiatry

## 2020-03-21 ENCOUNTER — Other Ambulatory Visit: Payer: Self-pay | Admitting: Podiatry

## 2020-03-21 VITALS — BP 151/91 | HR 106 | Temp 98.2°F

## 2020-03-21 DIAGNOSIS — L97422 Non-pressure chronic ulcer of left heel and midfoot with fat layer exposed: Secondary | ICD-10-CM | POA: Diagnosis not present

## 2020-03-21 DIAGNOSIS — W57XXXA Bitten or stung by nonvenomous insect and other nonvenomous arthropods, initial encounter: Secondary | ICD-10-CM | POA: Diagnosis not present

## 2020-03-21 DIAGNOSIS — L03119 Cellulitis of unspecified part of limb: Secondary | ICD-10-CM

## 2020-03-21 DIAGNOSIS — E119 Type 2 diabetes mellitus without complications: Secondary | ICD-10-CM

## 2020-03-21 DIAGNOSIS — Z794 Long term (current) use of insulin: Secondary | ICD-10-CM

## 2020-03-21 DIAGNOSIS — L02619 Cutaneous abscess of unspecified foot: Secondary | ICD-10-CM | POA: Diagnosis not present

## 2020-03-21 DIAGNOSIS — S90862A Insect bite (nonvenomous), left foot, initial encounter: Secondary | ICD-10-CM

## 2020-03-21 DIAGNOSIS — E11621 Type 2 diabetes mellitus with foot ulcer: Secondary | ICD-10-CM

## 2020-03-21 DIAGNOSIS — L03116 Cellulitis of left lower limb: Secondary | ICD-10-CM

## 2020-03-21 MED ORDER — AMOXICILLIN-POT CLAVULANATE 875-125 MG PO TABS: 1.0000 | ORAL_TABLET | Freq: Two times a day (BID) | ORAL | 0 refills | Status: AC

## 2020-03-21 NOTE — Patient Instructions (Addendum)
DRESSING CHANGES LEFT FOOT:  WEAR SURGICAL SHOE AT ALL TIMES    1. KEEP LEFT  FOOT DRY AT ALL TIMES!!!!  2. CLEANSE WOUND WITH SALINE.  3. DAB DRY WITH GAUZE SPONGE.  4. APPLY A LIGHT AMOUNT OF IODOSORB GEL TO BASE OF ULCER.  5. APPLY OUTER DRESSING/BAND-AID AS INSTRUCTED.  6. WEAR SURGICAL SHOE DAILY AT ALL TIMES.  7. DO NOT WALK BAREFOOT!!!  8.  IF YOU EXPERIENCE ANY FEVER, CHILLS, NIGHTSWEATS, NAUSEA OR VOMITING, ELEVATED OR LOW BLOOD SUGARS, REPORT TO EMERGENCY ROOM.  9. IF YOU EXPERIENCE INCREASED REDNESS, PAIN, SWELLING, DISCOLORATION, ODOR, PUS, DRAINAGE OR WARMTH OF YOUR FOOT, REPORT TO EMERGENCY ROOM.   Diabetes Mellitus and Foot Care Foot care is an important part of your health, especially when you have diabetes. Diabetes may cause you to have problems because of poor blood flow (circulation) to your feet and legs, which can cause your skin to:  Become thinner and drier.  Break more easily.  Heal more slowly.  Peel and crack. You may also have nerve damage (neuropathy) in your legs and feet, causing decreased feeling in them. This means that you may not notice minor injuries to your feet that could lead to more serious problems. Noticing and addressing any potential problems early is the best way to prevent future foot problems. How to care for your feet Foot hygiene  Wash your feet daily with warm water and mild soap. Do not use hot water. Then, pat your feet and the areas between your toes until they are completely dry. Do not soak your feet as this can dry your skin.  Trim your toenails straight across. Do not dig under them or around the cuticle. File the edges of your nails with an emery board or nail file.  Apply a moisturizing lotion or petroleum jelly to the skin on your feet and to dry, brittle toenails. Use lotion that does not contain alcohol and is unscented. Do not apply lotion between your toes. Shoes and socks  Wear clean socks or stockings every  day. Make sure they are not too tight. Do not wear knee-high stockings since they may decrease blood flow to your legs.  Wear shoes that fit properly and have enough cushioning. Always look in your shoes before you put them on to be sure there are no objects inside.  To break in new shoes, wear them for just a few hours a day. This prevents injuries on your feet. Wounds, scrapes, corns, and calluses  Check your feet daily for blisters, cuts, bruises, sores, and redness. If you cannot see the bottom of your feet, use a mirror or ask someone for help.  Do not cut corns or calluses or try to remove them with medicine.  If you find a minor scrape, cut, or break in the skin on your feet, keep it and the skin around it clean and dry. You may clean these areas with mild soap and water. Do not clean the area with peroxide, alcohol, or iodine.  If you have a wound, scrape, corn, or callus on your foot, look at it several times a day to make sure it is healing and not infected. Check for: ? Redness, swelling, or pain. ? Fluid or blood. ? Warmth. ? Pus or a bad smell. General instructions  Do not cross your legs. This may decrease blood flow to your feet.  Do not use heating pads or hot water bottles on your feet. They may burn your  skin. If you have lost feeling in your feet or legs, you may not know this is happening until it is too late.  Protect your feet from hot and cold by wearing shoes, such as at the beach or on hot pavement.  Schedule a complete foot exam at least once a year (annually) or more often if you have foot problems. If you have foot problems, report any cuts, sores, or bruises to your health care provider immediately. Contact a health care provider if:  You have a medical condition that increases your risk of infection and you have any cuts, sores, or bruises on your feet.  You have an injury that is not healing.  You have redness on your legs or feet.  You feel burning  or tingling in your legs or feet.  You have pain or cramps in your legs and feet.  Your legs or feet are numb.  Your feet always feel cold.  You have pain around a toenail. Get help right away if:  You have a wound, scrape, corn, or callus on your foot and: ? You have pain, swelling, or redness that gets worse. ? You have fluid or blood coming from the wound, scrape, corn, or callus. ? Your wound, scrape, corn, or callus feels warm to the touch. ? You have pus or a bad smell coming from the wound, scrape, corn, or callus. ? You have a fever. ? You have a red line going up your leg. Summary  Check your feet every day for cuts, sores, red spots, swelling, and blisters.  Moisturize feet and legs daily.  Wear shoes that fit properly and have enough cushioning.  If you have foot problems, report any cuts, sores, or bruises to your health care provider immediately.  Schedule a complete foot exam at least once a year (annually) or more often if you have foot problems. This information is not intended to replace advice given to you by your health care provider. Make sure you discuss any questions you have with your health care provider. Document Revised: 07/15/2019 Document Reviewed: 11/23/2016 Elsevier Patient Education  2020 Else[employer] Inc[employer]  Corns and Calluses Corns are small areas of thickened skin that occur on the top, sides, or tip of a toe. They contain a cone-shaped core with a point that can press on a nerve below. This causes pain.  Calluses are areas of thickened skin that can occur anywhere on the body, including the hands, fingers, palms, soles of the feet, and heels. Calluses are usually larger than corns. What are the causes? Corns and calluses are caused by rubbing (friction) or pressure, such as from shoes that are too tight or do not fit properly. What increases the risk? Corns are more likely to develop in people who have misshapen toes (toe deformities), such as  hammer toes. Calluses can occur with friction to any area of the skin. They are more likely to develop in people who:  Work with their hands.  Wear shoes that fit poorly, are too tight, or are high-heeled.  Have toe deformities. What are the signs or symptoms? Symptoms of a corn or callus include:  A hard growth on the skin.  Pain or tenderness under the skin.  Redness and swelling.  Increased discomfort while wearing tight-fitting shoes, if your feet are affected. If a corn or callus becomes infected, symptoms may include:  Redness and swelling that gets worse.  Pain.  Fluid, blood, or pus draining from the corn or  callus. How is this diagnosed? Corns and calluses may be diagnosed based on your symptoms, your medical history, and a physical exam. How is this treated? Treatment for corns and calluses may include:  Removing the cause of the friction or pressure. This may involve: ? Changing your shoes. ? Wearing shoe inserts (orthotics) or other protective layers in your shoes, such as a corn pad. ? Wearing gloves.  Applying medicine to the skin (topical medicine) to help soften skin in the hardened, thickened areas.  Removing layers of dead skin with a file to reduce the size of the corn or callus.  Removing the corn or callus with a scalpel or laser.  Taking antibiotic medicines, if your corn or callus is infected.  Having surgery, if a toe deformity is the cause. Follow these instructions at home:   Take over-the-counter and prescription medicines only as told by your health care provider.  If you were prescribed an antibiotic, take it as told by your health care provider. Do not stop taking it even if your condition starts to improve.  Wear shoes that fit well. Avoid wearing high-heeled shoes and shoes that are too tight or too loose.  Wear any padding, protective layers, gloves, or orthotics as told by your health care provider.  Soak your hands or feet and  then use a file or pumice stone to soften your corn or callus. Do this as told by your health care provider.  Check your corn or callus every day for symptoms of infection. Contact a health care provider if you:  Notice that your symptoms do not improve with treatment.  Have redness or swelling that gets worse.  Notice that your corn or callus becomes painful.  Have fluid, blood, or pus coming from your corn or callus.  Have new symptoms. Summary  Corns are small areas of thickened skin that occur on the top, sides, or tip of a toe.  Calluses are areas of thickened skin that can occur anywhere on the body, including the hands, fingers, palms, and soles of the feet. Calluses are usually larger than corns.  Corns and calluses are caused by rubbing (friction) or pressure, such as from shoes that are too tight or do not fit properly.  Treatment may include wearing any padding, protective layers, gloves, or orthotics as told by your health care provider. This information is not intended to replace advice given to you by your health care provider. Make sure you discuss any questions you have with your health care provider. Document Revised: 02/11/2019 Document Reviewed: 09/04/2017 Elsevier Patient Education  2020 [employer].

## 2020-03-22 ENCOUNTER — Telehealth: Payer: Self-pay

## 2020-03-22 DIAGNOSIS — M25511 Pain in right shoulder: Secondary | ICD-10-CM | POA: Diagnosis not present

## 2020-03-22 NOTE — Progress Notes (Signed)
Subjective: Wayne Diaz presents today referred by [facility] for diabetic foot evaluation.  He currently has no PCP. Dr. Loanne Drilling manages his diabetes.  Patient relates 20 year history of diabetes. He is an everyday smoker.  Patient denies any history of foot wounds.  Patient denies any history of numbness, tingling, burning, pins/needles sensations.  Today, patient c/o of painful, left foot. He states last Wednesday, he was lying in bed and felt a sudden pain like a bite. Wednesday night he noted there was a dot in the center of a lesion under his 1st metatarsal. He states his wife suspected it may be a wart, so he purchased Compound W and started applying it to the lesion. There has been no improvement. Lesion is tender to touch and itches a lot. He denies any fever, chills, nightsweats, nausea or vomiting.    Past Medical History:  Diagnosis Date  . ALLERGIC RHINITIS 05/05/2008  . ASTHMA 05/05/2008  . DIABETES MELLITUS, TYPE II 04/28/2008  . GERD 05/05/2008  . HYPERLIPIDEMIA 05/05/2008  . HYPERSOMNIA 03/10/2009  . SMOKER 03/10/2009    Patient Active Problem List   Diagnosis Date Noted  . Neck pain 01/19/2019  . Elevated blood pressure, situational 08/27/2014  . Anxiety state 08/26/2014  . Hypersomnia 09/11/2013  . Olecranon bursitis of right elbow 10/14/2012  . Gout 03/07/2012  . Preventative health care 03/16/2011  . BACK PAIN 06/22/2010  . SMOKER 03/10/2009  . HYPERSOMNIA 03/10/2009  . Hyperlipidemia 05/05/2008  . ALLERGIC RHINITIS 05/05/2008  . ASTHMA 05/05/2008  . GERD 05/05/2008  . Diabetes ([place]) 04/28/2008    Past Surgical History:  Procedure Laterality Date  . s/p left knee surgury     ACL repair  . TONSILLECTOMY      Current Outpatient Medications on File Prior to Visit  Medication Sig Dispense Refill  . Dulaglutide (TRULICITY) 6.04 VW/0.9WJ SOPN Inject 0.75 mg into the skin once a week. 12 pen 3  . glucose blood (ONE TOUCH ULTRA TEST) test strip 1 each by  Other route 2 (two) times daily. And lancets 2/day. 180 each 3  . insulin glargine (LANTUS SOLOSTAR) 100 UNIT/ML Solostar Pen Inject 30 Units into the skin every morning. 10 pen 3  . INVOKANA 100 MG TABS tablet TAKE 1 TABLET (100 MG TOTAL) BY MOUTH DAILY BEFORE BREAKFAST. 30 tablet 2  . Lancet Devices (ONE TOUCH DELICA LANCING DEV) MISC Used to check blood sugars. 1 each 0  . naproxen (NAPROSYN) 500 MG tablet Take 500 mg by mouth 2 (two) times daily.    . pioglitazone (ACTOS) 15 MG tablet Take 1 tablet (15 mg total) by mouth daily. 90 tablet 3  . SitaGLIPtin-MetFORMIN HCl (JANUMET XR) 50-1000 MG TB24 Take 1 tablet by mouth 2 (two) times daily. 180 tablet 3  . vardenafil (LEVITRA) 20 MG tablet Take 1 tablet (20 mg total) by mouth daily as needed for erectile dysfunction. 6 tablet 11   No current facility-administered medications on file prior to visit.     No Known Allergies  Social History   Occupational History  . Occupation: Science writer business  Tobacco Use  . Smoking status: Current Every Day Smoker    Packs/day: 1.00    Years: 30.00    Pack years: 30.00    Types: Cigarettes  . Smokeless tobacco: Never Used  Substance and Sexual Activity  . Alcohol use: No  . Drug use: No  . Sexual activity: Not on file    Family History  Problem  Relation Age of Onset  . Arthritis Other   . Hyperlipidemia Other   . Heart disease Other   . Hypertension Other   . Diabetes Other     Immunization History  Administered Date(s) Administered  . Pneumococcal Polysaccharide-23 03/10/2010  . Tdap 03/11/2015    Review of systems: Positive Findings in bold print.  Constitutional:  chills, fatigue, fever, sweats, weight change Communication: Nurse, learning disability, sign Presenter, broadcasting, hand writing, iPad/Android device Head: headaches, head injury Eyes: changes in vision, eye pain, glaucoma, cataracts, macular degeneration, diplopia, glare,  light sensitivity, eyeglasses or contacts, blindness Ears  nose mouth throat: hearing impaired, hearing aids,  ringing in ears, deaf, sign language,  vertigo, nosebleeds,  rhinitis,  cold sores, snoring, swollen glands Cardiovascular: HTN, edema, arrhythmia, pacemaker in place, defibrillator in place, chest pain/tightness, chronic anticoagulation, blood clot, heart failure, MI Peripheral Vascular: leg cramps, varicose veins, blood clots, lymphedema, varicosities Respiratory:  difficulty breathing, denies congestion, SOB, wheezing, cough, emphysema Gastrointestinal: change in appetite or weight, abdominal pain, constipation, diarrhea, nausea, vomiting, vomiting blood, change in bowel habits, abdominal pain, jaundice, rectal bleeding, hemorrhoids, GERD Genitourinary:  nocturia,  pain on urination, polyuria,  blood in urine, Foley catheter, urinary urgency, ESRD on hemodialysis Musculoskeletal: amputation, cramping, stiff joints, painful joints, decreased joint motion, fractures, OA, gout, hemiplegia, paraplegia, uses cane, wheelchair bound, uses walker, uses rollator Skin: changes in toenails, color change, dryness, itching, mole changes,  rash, wound(s) Neurological: headaches, numbness in feet, paresthesias in feet, burning in feet, fainting,  seizures, change in speech,  headaches, memory problems/poor historian, cerebral palsy, weakness, paralysis, CVA, TIA Endocrine: diabetes, hypothyroidism, hyperthyroidism,  goiter, dry mouth, flushing, heat intolerance,  cold intolerance,  excessive thirst, denies polyuria,  nocturia Hematological:  easy bleeding, excessive bleeding, easy bruising, enlarged lymph nodes, on long term blood thinner, history of past transusions Allergy/immunological:  hives, eczema, frequent infections, multiple drug allergies, seasonal allergies, transplant recipient, multiple food allergies Psychiatric:  anxiety, depression, mood disorder, suicidal ideations, hallucinations, insomnia  Objective: Vitals:   03/21/20 1326  BP: (!) 151/91    Pulse: (!) 106  Temp: 98.2 F (36.8 C)    59 y.o. male WD, WN IN NAD. AAO X 3.  Vascular Examination: Capillary refill time to digits immediate b/l. Palpable DP pulses b/l. Palpable PT pulses b/l. Pedal hair present b/l. Skin temperature gradient within normal limits b/l.  Dermatological Examination: Pedal skin with normal turgor, texture and tone bilaterally. No open wounds bilaterally. No interdigital macerations bilaterally.   Macerated annular lesion noted proximal to 1st metatarsal of the left foot measuring 1.8 x 1.7 cm. +Flocculence. There is erythema surrounding the lesion. I&D revealed thin, serosanguinous fluid. No tracking, no tunneling, no frank purulence. Post I &D measurements are 2.1x 2.0 x 0.2 cm.       Musculoskeletal Examination: Normal muscle strength 5/5 to all lower extremity muscle groups bilaterally. No gross bony deformities bilaterally. No pain crepitus or joint limitation noted with ROM b/l. Patient ambulates independent of any assistive aids.  Neurological Examination: Protective sensation intact 5/5 intact bilaterally with 10g monofilament b/l. Vibratory sensation intact b/l. Proprioception intact bilaterally. Babinski reflex negative b/l. Clonus negative b/l.  Xrays left foot: No gas in tissues No foreign body No evidence of fracture No evidence of bone erosion of first metatarsal  Assessment: 1. Insect bite of left foot, initial encounter   2. Cellulitis and abscess of foot, except toes   3. Diabetic ulcer of left midfoot associated with type 2 diabetes mellitus, with  fat layer exposed (HCC)   4. Encounter for diabetic foot exam (HCC)     Plan: -Examined patient. I suspect this was an insect bite given patient history of sudden pain on [place] and his clinical presentation on today.  -Diabetic foot examination performed on today's visit. -Patient to continue soft, supportive shoe gear daily. -Patient to report any pedal injuries to medical  professional immediately. -Discussed need for I&D. Patient agreed with treatment plan. Betadine prep applied to left foot. Using a sterile scalpel blade, an incision was made into the lesion. Drained about 1 cc serosanguinous fluid. Culture and sensitivity taken of wound today. Iodosorb Gel applied to base of wound.  -Written instructions dispensed for daily Iodosorb Gel dressings to be performed. He is to keep the foot dry until his next visit. -Surgical shoe dispensed and to be worn daily until next visit. -Xray left foot taken/reviewed.  -Rx sent to pharmacy for Augmentin 875 mg to be taken twice daily for 10 days. -Patient/POA to call should there be question/concern in the interim.  Return in about 8 days (around 03/29/2020).

## 2020-03-22 NOTE — Telephone Encounter (Signed)
Called [facility] to verify that the wound culture sent yesterday had all the correct account information. Verified account number  [id number] and address as [address]. Results are pending.

## 2020-03-24 LAB — WOUND CULTURE
MICRO NUMBER:: 10486228
RESULT:: NO GROWTH
SPECIMEN QUALITY:: ADEQUATE

## 2020-03-24 LAB — HOUSE ACCOUNT TRACKING

## 2020-03-25 ENCOUNTER — Other Ambulatory Visit: Payer: Self-pay

## 2020-03-25 ENCOUNTER — Encounter: Payer: Self-pay | Admitting: Endocrinology

## 2020-03-25 ENCOUNTER — Ambulatory Visit: Payer: BC Managed Care – PPO | Admitting: Endocrinology

## 2020-03-25 VITALS — BP 130/80 | HR 98 | Ht 69.0 in | Wt 192.0 lb

## 2020-03-25 DIAGNOSIS — E119 Type 2 diabetes mellitus without complications: Secondary | ICD-10-CM

## 2020-03-25 DIAGNOSIS — Z794 Long term (current) use of insulin: Secondary | ICD-10-CM | POA: Diagnosis not present

## 2020-03-25 DIAGNOSIS — M25511 Pain in right shoulder: Secondary | ICD-10-CM | POA: Diagnosis not present

## 2020-03-25 LAB — POCT GLYCOSYLATED HEMOGLOBIN (HGB A1C): Hemoglobin A1C: 6.9 % — AB (ref 4.0–5.6)

## 2020-03-25 MED ORDER — TRULICITY 1.5 MG/0.5ML ~~LOC~~ SOAJ: 1.5000 mg | SUBCUTANEOUS | 5 refills | Status: DC

## 2020-03-25 MED ORDER — LANTUS SOLOSTAR 100 UNIT/ML ~~LOC~~ SOPN: 20.0000 [IU] | PEN_INJECTOR | SUBCUTANEOUS | 3 refills | Status: DC

## 2020-03-25 NOTE — Patient Instructions (Signed)
I have sent a prescription to your pharmacy, to double the Trulicity, and: Pleaser reduce the insulin to 20 units each morning, and:  Please continue the same other medications check your blood sugar twice a day.  vary the time of day when you check, between before the 3 meals, and at bedtime.  also check if you have symptoms of your blood sugar being too high or too low.  please keep a record of the readings and bring it to your next appointment here (or you can bring the meter itself).  You can write it on any piece of paper.  please call us sooner if your blood sugar goes below 70, or if you have a lot of readings over 200. Please come back for a follow-up appointment in 2 months.

## 2020-03-25 NOTE — Progress Notes (Signed)
Subjective:    Patient ID: Wayne Diaz, male    DOB: 03-25-1961, 59 y.o.   MRN: [id number]  HPI Pt returns for f/u of diabetes mellitus: DM type: Insulin-requiring type 2 Dx'ed: 2002 Complications: none Therapy: insulin since 2010, Trulicity, and 4 oral meds.  DKA: never.  Severe hypoglycemia: never.  Pancreatitis: never.   Other: he declines multiple daily injections.   Interval history: no cbg record, but states cbg's are in the 100's.  He says he never misses meds.  pt states he feels well in general.  Past Medical History:  Diagnosis Date  . ALLERGIC RHINITIS 05/05/2008  . ASTHMA 05/05/2008  . DIABETES MELLITUS, TYPE II 04/28/2008  . GERD 05/05/2008  . HYPERLIPIDEMIA 05/05/2008  . HYPERSOMNIA 03/10/2009  . SMOKER 03/10/2009    Past Surgical History:  Procedure Laterality Date  . s/p left knee surgury     ACL repair  . TONSILLECTOMY      Social History   Socioeconomic History  . Marital status: Married    Spouse name: Not on file  . Number of children: 4  . Years of education: 89  . Highest education level: Not on file  Occupational History  . Occupation: Photographer business  Tobacco Use  . Smoking status: Current Every Day Smoker    Packs/day: 1.00    Years: 30.00    Pack years: 30.00    Types: Cigarettes  . Smokeless tobacco: Never Used  Substance and Sexual Activity  . Alcohol use: No  . Drug use: No  . Sexual activity: Not on file  Other Topics Concern  . Not on file  Social History Narrative   Born and raised in [place], [place]. Raised [place]. Currently resides in house with his wife and daughter. Fun: Yard work, baseball.   Denies religious beliefs effecting health care.    Social Determinants of Health   Financial Resource Strain:   . Difficulty of Paying Living Expenses:   Food Insecurity:   . Worried About Programme researcher, broadcasting/film/video in the Last Year:   . Barista in the Last Year:   Transportation Needs:   . Freight forwarder  (Medical):   Marland Kitchen Lack of Transportation (Non-Medical):   Physical Activity:   . Days of Exercise per Week:   . Minutes of Exercise per Session:   Stress:   . Feeling of Stress :   Social Connections:   . Frequency of Communication with Friends and Family:   . Frequency of Social Gatherings with Friends and Family:   . Attends Religious Services:   . Active Member of Clubs or Organizations:   . Attends Banker Meetings:   Marland Kitchen Marital Status:   Intimate Partner Violence:   . Fear of Current or Ex-Partner:   . Emotionally Abused:   Marland Kitchen Physically Abused:   . Sexually Abused:     Current Outpatient Medications on File Prior to Visit  Medication Sig Dispense Refill  . amoxicillin-clavulanate (AUGMENTIN) 875-125 MG tablet Take 1 tablet by mouth 2 (two) times daily for 10 days. 20 tablet 0  . glucose blood (ONE TOUCH ULTRA TEST) test strip 1 each by Other route 2 (two) times daily. And lancets 2/day. 180 each 3  . INVOKANA 100 MG TABS tablet TAKE 1 TABLET (100 MG TOTAL) BY MOUTH DAILY BEFORE BREAKFAST. 30 tablet 2  . Lancet Devices (ONE TOUCH DELICA LANCING DEV) MISC Used to check blood sugars. 1 each 0  .  naproxen (NAPROSYN) 500 MG tablet Take 500 mg by mouth 2 (two) times daily.    . pioglitazone (ACTOS) 15 MG tablet Take 1 tablet (15 mg total) by mouth daily. 90 tablet 3  . SitaGLIPtin-MetFORMIN HCl (JANUMET XR) 50-1000 MG TB24 Take 1 tablet by mouth 2 (two) times daily. 180 tablet 3  . vardenafil (LEVITRA) 20 MG tablet Take 1 tablet (20 mg total) by mouth daily as needed for erectile dysfunction. (Patient taking differently: Take 20 mg by mouth daily as needed for erectile dysfunction. WILL PURCHASE WITHOUT USE OF INSURANCE AND WITH THE USE OF GOOD RX CARD) 6 tablet 11   No current facility-administered medications on file prior to visit.    No Known Allergies  Family History  Problem Relation Age of Onset  . Arthritis Other   . Hyperlipidemia Other   . Heart disease  Other   . Hypertension Other   . Diabetes Other     BP 130/80   Pulse 98   Ht 5\' 9"  (1.753 m)   Wt 192 lb (87.1 kg)   SpO2 98%   BMI 28.35 kg/m    Review of Systems He denies hypoglycemia.      Objective:   Physical Exam VITAL SIGNS:  See vs page GENERAL: no distress Pulses: dorsalis pedis intact bilat.   MSK: no deformity of the feet CV: no leg edema Skin:  no ulcer on the feet.  normal color and temp on the feet.  Bandage of the left foot (spider bite--saw Podiatry).   Neuro: sensation is intact to touch on the feet.    Lab Results  Component Value Date   HGBA1C 6.9 (A) 03/25/2020       Assessment & Plan:  Insulin-requiring type 2 DM: he would benefit from increased rx, if it can be done with a regimen that avoids or minimizes hypoglycemia.    Patient Instructions  I have sent a prescription to your pharmacy, to double the Trulicity, and: [place] reduce the insulin to 20 units each morning, and:  Please continue the same other medications check your blood sugar twice a day.  vary the time of day when you check, between before the 3 meals, and at bedtime.  also check if you have symptoms of your blood sugar being too high or too low.  please keep a record of the readings and bring it to your next appointment here (or you can bring the meter itself).  You can write it on any piece of paper.  please call us sooner if your blood sugar goes below 70, or if you have a lot of readings over 200. Please come back for a follow-up appointment in 2 months.

## 2020-03-28 ENCOUNTER — Ambulatory Visit: Payer: BC Managed Care – PPO | Admitting: Podiatry

## 2020-03-28 ENCOUNTER — Encounter: Payer: Self-pay | Admitting: Podiatry

## 2020-03-28 ENCOUNTER — Other Ambulatory Visit: Payer: Self-pay

## 2020-03-28 VITALS — Temp 97.6°F

## 2020-03-28 DIAGNOSIS — S90862A Insect bite (nonvenomous), left foot, initial encounter: Secondary | ICD-10-CM

## 2020-03-28 DIAGNOSIS — Z8631 Personal history of diabetic foot ulcer: Secondary | ICD-10-CM

## 2020-03-28 DIAGNOSIS — L03116 Cellulitis of left lower limb: Secondary | ICD-10-CM

## 2020-03-28 DIAGNOSIS — L02619 Cutaneous abscess of unspecified foot: Secondary | ICD-10-CM | POA: Diagnosis not present

## 2020-03-28 DIAGNOSIS — W57XXXA Bitten or stung by nonvenomous insect and other nonvenomous arthropods, initial encounter: Secondary | ICD-10-CM

## 2020-03-28 NOTE — Progress Notes (Signed)
Subjective:  Patient ID: Wayne Diaz, male    DOB: 1961/04/17,  MRN: [id number]  59 y.o. male is seen for follow up ulcer plantar left foot.   Wound Care regimen: Iodosorb dressing changes daily by patient/wife.  Hx confirmed with patient.  Wound location: submet head 1 left foot.  Patient denies fever, chills, night sweats, nausea, or vomiting.  He states foot felt better the day he left the office after incision and drainage. He had to wait 1 1/2 days for pharmacy to get Augmentin in stock. He still has to take a few to complete the course.   His wife has been assisting him with daily Iodosorb dressing changes. Notes no new pedal concerns on today's visit.  Past Medical History:  Diagnosis Date  . ALLERGIC RHINITIS 05/05/2008  . ASTHMA 05/05/2008  . DIABETES MELLITUS, TYPE II 04/28/2008  . GERD 05/05/2008  . HYPERLIPIDEMIA 05/05/2008  . HYPERSOMNIA 03/10/2009  . SMOKER 03/10/2009     Past Surgical History:  Procedure Laterality Date  . s/p left knee surgury     ACL repair  . TONSILLECTOMY       Current Outpatient Medications on File Prior to Visit  Medication Sig Dispense Refill  . amoxicillin-clavulanate (AUGMENTIN) 875-125 MG tablet Take 1 tablet by mouth 2 (two) times daily for 10 days. 20 tablet 0  . Dulaglutide (TRULICITY) 1.5 HK/7.4QV SOPN Inject 1.5 mg into the skin once a week. 4 pen 5  . glucose blood (ONE TOUCH ULTRA TEST) test strip 1 each by Other route 2 (two) times daily. And lancets 2/day. 180 each 3  . insulin glargine (LANTUS SOLOSTAR) 100 UNIT/ML Solostar Pen Inject 20 Units into the skin every morning. 10 pen 3  . INVOKANA 100 MG TABS tablet TAKE 1 TABLET (100 MG TOTAL) BY MOUTH DAILY BEFORE BREAKFAST. 30 tablet 2  . Lancet Devices (ONE TOUCH DELICA LANCING DEV) MISC Used to check blood sugars. 1 each 0  . naproxen (NAPROSYN) 500 MG tablet Take 500 mg by mouth 2 (two) times daily.    . pioglitazone (ACTOS) 15 MG tablet Take 1 tablet (15 mg total) by mouth daily.  90 tablet 3  . SitaGLIPtin-MetFORMIN HCl (JANUMET XR) 50-1000 MG TB24 Take 1 tablet by mouth 2 (two) times daily. 180 tablet 3  . vardenafil (LEVITRA) 20 MG tablet Take 1 tablet (20 mg total) by mouth daily as needed for erectile dysfunction. (Patient taking differently: Take 20 mg by mouth daily as needed for erectile dysfunction. WILL PURCHASE WITHOUT USE OF INSURANCE AND WITH THE USE OF GOOD RX CARD) 6 tablet 11   No current facility-administered medications on file prior to visit.     No Known Allergies   Family History  Problem Relation Age of Onset  . Arthritis Other   . Hyperlipidemia Other   . Heart disease Other   . Hypertension Other   . Diabetes Other      Social History   Occupational History  . Occupation: Science writer business  Tobacco Use  . Smoking status: Current Every Day Smoker    Packs/day: 1.00    Years: 30.00    Pack years: 30.00    Types: Cigarettes  . Smokeless tobacco: Never Used  Substance and Sexual Activity  . Alcohol use: No  . Drug use: No  . Sexual activity: Not on file     Immunization History  Administered Date(s) Administered  . Pneumococcal Polysaccharide-23 03/10/2010  . Tdap 03/11/2015     Objective:  Physical Exam: Vitals:   03/28/20 0803  Temp: 97.6 F (36.4 C)     59 y.o. male Caucasian male, WD, WN in NAD. AAO x 3.  Neurovascular status unchanged b/l. Capillary refill time to digits immediate b/l. Palpable DP pulses b/l. Palpable PT pulses b/l. Pedal hair present b/l. Skin temperature gradient within normal limits b/l.  Pedal skin with normal turgor, texture and tone bilaterally. No interdigital macerations bilaterally. Nails 1-5 adequate length today with mild early onychomycosis left great and left 2nd digits.  Surrounding cellulitis is resolved plantar left foot. Wound Location: submet head 1 left foot There is no necrotic tissue present Predebridement Wound Measurement: 0.2 x 0.5 cm scab Postdebridement Wound  Measurement:  0.2 x 0.7 x planar depth Wound Base: Epithelial Peri-wound: Calloused Exudate: None: wound tissue dry Blood Loss during debridement: 0 cc's. Description of tissue removed from ulceration today:  Surrounding hyperkeratosis. Signs of clinical bacterial infection are absent. Material in wound which inhibits healing/promotes adjacent tissue breakdown:  None.  Normal muscle strength 5/5 to all lower extremity muscle groups bilaterally. No gross bony deformities bilaterally. No pain crepitus or joint limitation noted with ROM b/l. Patient ambulates independent of any assistive aids.  Protective sensation intact 5/5 intact bilaterally with 10g monofilament b/l. Vibratory sensation intact b/l. Proprioception intact bilaterally.  Hemoglobin A1C Latest Ref Rng & Units 03/25/2020 01/22/2020 10/23/2019 07/24/2019 04/17/2019  HGBA1C 4.0 - 5.6 % 6.9(A) 7.3(A) 7.4(A) 7.2(A) 7.1(A)  Some recent data might be hidden     Lab Results  Component Value Date   HGBA1C 6.9 (A) 03/25/2020    Awaiting results of wound culture and sensitivity left foot wound.  Assessment:   1. Cellulitis and abscess of foot, except toes   2. Healed diabetic foot ulcer   3. Insect bite of left foot, initial encounter    Plan:  Patient was evaluated and treated and all questions answered.  Patient/POA/Family member educated on diagnosis and treatment plan of routine ulcer debridement/wound care.  Recommended tea tree oil for early onychomycosis. Type/amount of devitalized tissue removed: Today's ulcer size post-debridement: scab 0.2 x 0.7 x plantar depth Wound responded well to today's debridement. Patient risk factors affecting healing of ulcer: diabetes Frequency of debridement needed to achieve healing: Ulcer healed He may shower. Patient to continue soft, supportive shoe gear daily. Patient to report any pedal injuries to medical professional immediately. Patient/POA to call should there be  question/concern in the interim.  Return in about 6 months (around 09/28/2020) for diabetic foot care.  Freddie Breech, DPM

## 2020-03-28 NOTE — Patient Instructions (Signed)
Skin Abscess  A skin abscess is an infected area on or under your skin that contains a collection of pus and other material. An abscess may also be called a furuncle, carbuncle, or boil. An abscess can occur in or on almost any part of your body. Some abscesses break open (rupture) on their own. Most continue to get worse unless they are treated. The infection can spread deeper into the body and eventually into your blood, which can make you feel ill. Treatment usually involves draining the abscess. What are the causes? An abscess occurs when germs, like bacteria, pass through your skin and cause an infection. This may be caused by:  A scrape or cut on your skin.  A puncture wound through your skin, including a needle injection or insect bite.  Blocked oil or sweat glands.  Blocked and infected hair follicles.  A cyst that forms beneath your skin (sebaceous cyst) and becomes infected. What increases the risk? This condition is more likely to develop in people who:  Have a weak body defense system (immune system).  Have diabetes.  Have dry and irritated skin.  Get frequent injections or use illegal IV drugs.  Have a foreign body in a wound, such as a splinter.  Have problems with their lymph system or veins. What are the signs or symptoms? Symptoms of this condition include:  A painful, firm bump under the skin.  A bump with pus at the top. This may break through the skin and drain. Other symptoms include:  Redness surrounding the abscess site.  Warmth.  Swelling of the lymph nodes (glands) near the abscess.  Tenderness.  A sore on the skin. How is this diagnosed? This condition may be diagnosed based on:  A physical exam.  Your medical history.  A sample of pus. This may be used to find out what is causing the infection.  Blood tests.  Imaging tests, such as an ultrasound, CT scan, or MRI. How is this treated? A small abscess that drains on its own may  not need treatment. Treatment for larger abscesses may include:  Moist heat or heat pack applied to the area several times a day.  A procedure to drain the abscess (incision and drainage).  Antibiotic medicines. For a severe abscess, you may first get antibiotics through an IV and then change to antibiotics by mouth. Follow these instructions at home: Medicines   Take over-the-counter and prescription medicines only as told by your health care provider.  If you were prescribed an antibiotic medicine, take it as told by your health care provider. Do not stop taking the antibiotic even if you start to feel better. Abscess care   If you have an abscess that has not drained, apply heat to the affected area. Use the heat source that your health care provider recommends, such as a moist heat pack or a heating pad. ? Place a towel between your skin and the heat source. ? Leave the heat on for 20-30 minutes. ? Remove the heat if your skin turns bright red. This is especially important if you are unable to feel pain, heat, or cold. You may have a greater risk of getting burned.  Follow instructions from your health care provider about how to take care of your abscess. Make sure you: ? Cover the abscess with a bandage (dressing). ? Change your dressing or gauze as told by your health care provider. ? Wash your hands with soap and water before you change the   dressing or gauze. If soap and water are not available, use hand sanitizer.  Check your abscess every day for signs of a worsening infection. Check for: ? More redness, swelling, or pain. ? More fluid or blood. ? Warmth. ? More pus or a bad smell. General instructions  To avoid spreading the infection: ? Do not share personal care items, towels, or hot tubs with others. ? Avoid making skin contact with other people.  Keep all follow-up visits as told by your health care provider. This is important. Contact a health care provider if  you have:  More redness, swelling, or pain around your abscess.  More fluid or blood coming from your abscess.  Warm skin around your abscess.  More pus or a bad smell coming from your abscess.  A fever.  Muscle aches.  Chills or a general ill feeling. Get help right away if you:  Have severe pain.  See red streaks on your skin spreading away from the abscess. Summary  A skin abscess is an infected area on or under your skin that contains a collection of pus and other material.  A small abscess that drains on its own may not need treatment.  Treatment for larger abscesses may include having a procedure to drain the abscess and taking an antibiotic. This information is not intended to replace advice given to you by your health care provider. Make sure you discuss any questions you have with your health care provider. Document Revised: 02/12/2019 Document Reviewed: 12/05/2017 Elsevier Patient Education  2020 Elsevier Inc.  

## 2020-03-29 DIAGNOSIS — M25511 Pain in right shoulder: Secondary | ICD-10-CM | POA: Diagnosis not present

## 2020-04-01 DIAGNOSIS — M25511 Pain in right shoulder: Secondary | ICD-10-CM | POA: Diagnosis not present

## 2020-04-05 DIAGNOSIS — M25511 Pain in right shoulder: Secondary | ICD-10-CM | POA: Diagnosis not present

## 2020-04-08 DIAGNOSIS — M25511 Pain in right shoulder: Secondary | ICD-10-CM | POA: Diagnosis not present

## 2020-04-15 DIAGNOSIS — M25511 Pain in right shoulder: Secondary | ICD-10-CM | POA: Diagnosis not present

## 2020-04-19 DIAGNOSIS — M25511 Pain in right shoulder: Secondary | ICD-10-CM | POA: Diagnosis not present

## 2020-04-22 DIAGNOSIS — M25511 Pain in right shoulder: Secondary | ICD-10-CM | POA: Diagnosis not present

## 2020-04-26 DIAGNOSIS — M25511 Pain in right shoulder: Secondary | ICD-10-CM | POA: Diagnosis not present

## 2020-04-29 DIAGNOSIS — M25511 Pain in right shoulder: Secondary | ICD-10-CM | POA: Diagnosis not present

## 2020-05-06 DIAGNOSIS — Z96611 Presence of right artificial shoulder joint: Secondary | ICD-10-CM | POA: Diagnosis not present

## 2020-05-20 DIAGNOSIS — M25511 Pain in right shoulder: Secondary | ICD-10-CM | POA: Diagnosis not present

## 2020-05-27 ENCOUNTER — Other Ambulatory Visit: Payer: Self-pay

## 2020-05-27 ENCOUNTER — Encounter: Payer: Self-pay | Admitting: Endocrinology

## 2020-05-27 ENCOUNTER — Ambulatory Visit: Payer: BC Managed Care – PPO | Admitting: Endocrinology

## 2020-05-27 VITALS — BP 118/70 | HR 101 | Ht 69.0 in | Wt 180.8 lb

## 2020-05-27 DIAGNOSIS — E119 Type 2 diabetes mellitus without complications: Secondary | ICD-10-CM

## 2020-05-27 DIAGNOSIS — M25511 Pain in right shoulder: Secondary | ICD-10-CM | POA: Diagnosis not present

## 2020-05-27 DIAGNOSIS — Z794 Long term (current) use of insulin: Secondary | ICD-10-CM | POA: Diagnosis not present

## 2020-05-27 LAB — POCT GLYCOSYLATED HEMOGLOBIN (HGB A1C): Hemoglobin A1C: 6.9 % — AB (ref 4.0–5.6)

## 2020-05-27 MED ORDER — LANTUS SOLOSTAR 100 UNIT/ML ~~LOC~~ SOPN: 15.0000 [IU] | PEN_INJECTOR | SUBCUTANEOUS | 3 refills | Status: DC

## 2020-05-27 MED ORDER — DAPAGLIFLOZIN PROPANEDIOL 5 MG PO TABS
5.0000 mg | ORAL_TABLET | Freq: Every day | ORAL | 3 refills | Status: DC
Start: 2020-05-27 — End: 2021-05-19

## 2020-05-27 NOTE — Progress Notes (Signed)
Subjective:    Patient ID: Wayne Diaz, male    DOB: [birth year], 60 y.o.   MRN: [id number]  HPI Pt returns for f/u of diabetes mellitus: DM type: Insulin-requiring type 2 Dx'ed: 2002 Complications: none Therapy: insulin since 2010, Trulicity, and 4 oral meds.  DKA: never.  Severe hypoglycemia: never.  Pancreatitis: never.   Other: he declines multiple daily injections.   Interval history: no cbg record, but states cbg's are in the 100's.  He says he never misses meds.  He has lost weight.  He has intermitt diarrhea Past Medical History:  Diagnosis Date  . ALLERGIC RHINITIS 05/05/2008  . ASTHMA 05/05/2008  . DIABETES MELLITUS, TYPE II 04/28/2008  . GERD 05/05/2008  . HYPERLIPIDEMIA 05/05/2008  . HYPERSOMNIA 03/10/2009  . SMOKER 03/10/2009    Past Surgical History:  Procedure Laterality Date  . s/p left knee surgury     ACL repair  . TONSILLECTOMY      Social History   Socioeconomic History  . Marital status: Married    Spouse name: Not on file  . Number of children: 4  . Years of education: 69  . Highest education level: Not on file  Occupational History  . Occupation: Photographer business  Tobacco Use  . Smoking status: Current Every Day Smoker    Packs/day: 1.00    Years: 30.00    Pack years: 30.00    Types: Cigarettes  . Smokeless tobacco: Never Used  Substance and Sexual Activity  . Alcohol use: No  . Drug use: No  . Sexual activity: Not on file  Other Topics Concern  . Not on file  Social History Narrative   Born and raised in [place], [place]. Raised [place]. Currently resides in house with his wife and daughter. Fun: Yard work, baseball.   Denies religious beliefs effecting health care.    Social Determinants of Health   Financial Resource Strain:   . Difficulty of Paying Living Expenses:   Food Insecurity:   . Worried About Programme researcher, broadcasting/film/video in the Last Year:   . Barista in the Last Year:   Transportation Needs:   . Automotive engineer (Medical):   Marland Kitchen Lack of Transportation (Non-Medical):   Physical Activity:   . Days of Exercise per Week:   . Minutes of Exercise per Session:   Stress:   . Feeling of Stress :   Social Connections:   . Frequency of Communication with Friends and Family:   . Frequency of Social Gatherings with Friends and Family:   . Attends Religious Services:   . Active Member of Clubs or Organizations:   . Attends Banker Meetings:   Marland Kitchen Marital Status:   Intimate Partner Violence:   . Fear of Current or Ex-Partner:   . Emotionally Abused:   Marland Kitchen Physically Abused:   . Sexually Abused:     Current Outpatient Medications on File Prior to Visit  Medication Sig Dispense Refill  . Dulaglutide (TRULICITY) 1.5 MG/0.5ML SOPN Inject 1.5 mg into the skin once a week. 4 pen 5  . glucose blood (ONE TOUCH ULTRA TEST) test strip 1 each by Other route 2 (two) times daily. And lancets 2/day. 180 each 3  . Lancet Devices (ONE TOUCH DELICA LANCING DEV) MISC Used to check blood sugars. 1 each 0  . naproxen (NAPROSYN) 500 MG tablet Take 500 mg by mouth 2 (two) times daily.    . pioglitazone (ACTOS) 15 MG  tablet Take 1 tablet (15 mg total) by mouth daily. 90 tablet 3  . SitaGLIPtin-MetFORMIN HCl (JANUMET XR) 50-1000 MG TB24 Take 1 tablet by mouth 2 (two) times daily. 180 tablet 3  . vardenafil (LEVITRA) 20 MG tablet Take 1 tablet (20 mg total) by mouth daily as needed for erectile dysfunction. (Patient taking differently: Take 20 mg by mouth daily as needed for erectile dysfunction. WILL PURCHASE WITHOUT USE OF INSURANCE AND WITH THE USE OF GOOD RX CARD) 6 tablet 11   No current facility-administered medications on file prior to visit.    No Known Allergies  Family History  Problem Relation Age of Onset  . Arthritis Other   . Hyperlipidemia Other   . Heart disease Other   . Hypertension Other   . Diabetes Other     BP 118/70   Pulse 101   Ht 5\' 9"  (1.753 m)   Wt 180 lb 12.8 oz (82  kg)   SpO2 96%   BMI 26.70 kg/m    Review of Systems He denies hypoglycemia    Objective:   Physical Exam VITAL SIGNS:  See vs page GENERAL: no distress Pulses: dorsalis pedis intact bilat.   MSK: no deformity of the feet CV: 1+ bilat leg edema Skin:  no ulcer on the feet.  normal color and temp on the feet. Neuro: sensation is intact to touch on the feet Ext: there is bilateral onychomycosis of the toenails.     Lab Results  Component Value Date   HGBA1C 6.9 (A) 05/27/2020       Assessment & Plan:  Insulin-requiring type 2 DM: overcontrolled, given this insulin regimen, which does match insulin to her changing needs throughout the day Diarrhea, due to Trulicity  Patient Instructions  Due to the diarrhea, we won't increase Trulicity for now.   I have sent a prescription to your pharmacy, to change Invokana to 05/29/2020.  Please reduce the insulin to 15 units each morning.  check your blood sugar twice a day.  vary the time of day when you check, between before the 3 meals, and at bedtime.  also check if you have symptoms of your blood sugar being too high or too low.  please keep a record of the readings and bring it to your next appointment here (or you can bring the meter itself).  You can write it on any piece of paper.  please call [place] sooner if your blood sugar goes below 70, or if you have a lot of readings over 200. Please come back for a follow-up appointment in 2 months.

## 2020-05-27 NOTE — Patient Instructions (Addendum)
Due to the diarrhea, we won't increase Trulicity for now.   I have sent a prescription to your pharmacy, to change Invokana to [place].  Please reduce the insulin to 15 units each morning.  check your blood sugar twice a day.  vary the time of day when you check, between before the 3 meals, and at bedtime.  also check if you have symptoms of your blood sugar being too high or too low.  please keep a record of the readings and bring it to your next appointment here (or you can bring the meter itself).  You can write it on any piece of paper.  please call us sooner if your blood sugar goes below 70, or if you have a lot of readings over 200. Please come back for a follow-up appointment in 2 months.

## 2020-05-31 DIAGNOSIS — M25511 Pain in right shoulder: Secondary | ICD-10-CM | POA: Diagnosis not present

## 2020-06-03 DIAGNOSIS — M25511 Pain in right shoulder: Secondary | ICD-10-CM | POA: Diagnosis not present

## 2020-06-10 ENCOUNTER — Ambulatory Visit: Payer: Self-pay | Admitting: Family Medicine

## 2020-06-21 DIAGNOSIS — M25511 Pain in right shoulder: Secondary | ICD-10-CM | POA: Diagnosis not present

## 2020-06-24 DIAGNOSIS — M25511 Pain in right shoulder: Secondary | ICD-10-CM | POA: Diagnosis not present

## 2020-06-28 DIAGNOSIS — M25511 Pain in right shoulder: Secondary | ICD-10-CM | POA: Diagnosis not present

## 2020-07-11 DIAGNOSIS — Z20822 Contact with and (suspected) exposure to covid-19: Secondary | ICD-10-CM | POA: Diagnosis not present

## 2020-07-12 ENCOUNTER — Telehealth: Payer: Self-pay | Admitting: General Practice

## 2020-07-12 NOTE — Telephone Encounter (Signed)
Patient is requesting lab work to be done prior to his new patient appt, he would like to dicuss results at appt.

## 2020-07-12 NOTE — Telephone Encounter (Signed)
See below

## 2020-07-12 NOTE — Telephone Encounter (Signed)
I can't order labs on patients that have not established here.  Wayne Diaz. Jimmey Ralph, MD 07/12/2020 12:45 PM

## 2020-07-12 NOTE — Telephone Encounter (Signed)
Pt aware, unable to order labs till new patient appt

## 2020-07-27 ENCOUNTER — Other Ambulatory Visit: Payer: Self-pay

## 2020-07-27 ENCOUNTER — Ambulatory Visit (INDEPENDENT_AMBULATORY_CARE_PROVIDER_SITE_OTHER): Payer: BC Managed Care – PPO | Admitting: Family Medicine

## 2020-07-27 ENCOUNTER — Encounter: Payer: Self-pay | Admitting: Family Medicine

## 2020-07-27 VITALS — BP 138/76 | HR 88 | Temp 98.3°F | Ht 69.0 in | Wt 179.2 lb

## 2020-07-27 DIAGNOSIS — R634 Abnormal weight loss: Secondary | ICD-10-CM

## 2020-07-27 DIAGNOSIS — E785 Hyperlipidemia, unspecified: Secondary | ICD-10-CM | POA: Diagnosis not present

## 2020-07-27 DIAGNOSIS — E119 Type 2 diabetes mellitus without complications: Secondary | ICD-10-CM | POA: Diagnosis not present

## 2020-07-27 DIAGNOSIS — F17200 Nicotine dependence, unspecified, uncomplicated: Secondary | ICD-10-CM

## 2020-07-27 DIAGNOSIS — Z794 Long term (current) use of insulin: Secondary | ICD-10-CM

## 2020-07-27 DIAGNOSIS — R197 Diarrhea, unspecified: Secondary | ICD-10-CM | POA: Diagnosis not present

## 2020-07-27 DIAGNOSIS — Z8249 Family history of ischemic heart disease and other diseases of the circulatory system: Secondary | ICD-10-CM

## 2020-07-27 NOTE — Assessment & Plan Note (Signed)
Patient was asked about his tobacco use today and was strongly advised to quit. Patient is currently contemplative. We reviewed treatment options to assist him quit smoking including NRT, Chantix, and Bupropion. Follow up at next office visit.   Total time spent counseling approximately 4 minutes.

## 2020-07-27 NOTE — Patient Instructions (Signed)
It was very nice to see you today!  I think your diarrhea is likely combination of lactose intolerance your medications and dietary changes.  We will check blood work today and a urine sample.  Also get a CT scan to evaluate for heart disease.  Please let me know when you are interested in resources to help quit smoking.  Take care, Dr Jimmey Ralph  Please try these tips to maintain a healthy lifestyle:   Eat at least 3 REAL meals and 1-2 snacks per day.  Aim for no more than 5 hours between eating.  If you eat breakfast, please do so within one hour of getting up.    Each meal should contain half fruits/vegetables, one quarter protein, and one quarter carbs (no bigger than a computer mouse)   Cut down on sweet beverages. This includes juice, soda, and sweet tea.     Drink at least 1 glass of water with each meal and aim for at least 8 glasses per day   Exercise at least 150 minutes every week.

## 2020-07-27 NOTE — Progress Notes (Signed)
   Wayne Diaz is a 59 y.o. male who presents today for an office visit.    Assessment/Plan:  New/Acute Problems: Diarrhea / Weight Loss Benign abdominal exam today.  Likely multifactorial in setting of Metformin, lactose intolerance, and possible small bowel overgrowth.  Recommend he continue probiotics and this seems to be helping.  Also recommended avoiding dairy as this also seems to be helping.  Discussed importance of fiber supplementation.  Will check labs today including CBC, CMET, TSH, CMET, and lipase.  He is due for colonoscopy however he adamantly refused to have this done again.  If labs are normal will likely continue with watchful waiting given that symptoms seem to have stabilized.  If continues to have weight loss will likely need abdominal CT scan and discussion again about colonoscopy/ GI referral.  Family history of heart disease We will check cardiac CT scan.  This will also allow [place] to evaluate lungs due to smoking history.  Chronic Problems Addressed Today: Nicotine dependence with current use Patient was asked about his tobacco use today and was strongly advised to quit. Patient is currently contemplative. We reviewed treatment options to assist him quit smoking including NRT, Chantix, and Bupropion. Follow up at next office visit.   Total time spent counseling approximately 4 minutes.    Hyperlipidemia Check lipid panel.  Diabetes Continue management per endocrinology.  Will be following up with him later this week.  Currently on Farxiga, Lantus, Trulicity, and Janumet.     Subjective:  HPI:  Patient is here to reestablish care.  [facility] but has not been seen in over 3 years and previous PCP no longer works with [place].  He has been seeing Dr. Everardo All who has been managing his diabetes.  Patient is concerned due to rapid weight loss for the last 2 to 3 months.  He thinks he has lost about 35 pounds over that timeframe.  He also had  persistent diarrhea associated with this.  Diarrhea described as loose and watery and occurring 5 times per day for about 6 weeks.  He stopped taking dairy and has significant improvement in symptoms.  He also recently started taking probiotic which seemed to help.  Tried taking Imodium which caused bloating.  No abdominal pain.  No fevers or chills.  No melena or hematochezia.       Objective:  Physical Exam: BP 138/76   Pulse 88   Temp 98.3 F (36.8 C) (Temporal)   Ht 5\' 9"  (1.753 m)   Wt 179 lb 3.2 oz (81.3 kg)   SpO2 97%   BMI 26.46 kg/m   Gen: No acute distress, resting comfortably CV: Regular rate and rhythm with no murmurs appreciated Pulm: Normal work of breathing, clear to auscultation bilaterally with no crackles, wheezes, or rhonchi GU: S, NT, ND Neuro: Grossly normal, moves all extremities Psych: Normal affect and thought content  Time Spent: 49 minutes of total time was spent on the date of the encounter performing the following actions: chart review prior to seeing the patient including visits with previous PCP and endocrinology, obtaining history, performing a medically necessary exam, counseling on the treatment plan, placing orders, and documenting in our EHR.  This time does not include time spent discussing nicotine use.       . Katina Degree, MD 07/27/2020 10:20 AM

## 2020-07-27 NOTE — Assessment & Plan Note (Signed)
Continue management per endocrinology.  Will be following up with him later this week.  Currently on Farxiga, Lantus, Trulicity, and Janumet.

## 2020-07-27 NOTE — Assessment & Plan Note (Signed)
Check lipid panel  

## 2020-07-28 LAB — CBC
HCT: 49.5 % (ref 38.5–50.0)
Hemoglobin: 16.3 g/dL (ref 13.2–17.1)
MCH: 30.6 pg (ref 27.0–33.0)
MCHC: 32.9 g/dL (ref 32.0–36.0)
MCV: 93 fL (ref 80.0–100.0)
MPV: 11.7 fL (ref 7.5–12.5)
Platelets: 153 10*3/uL (ref 140–400)
RBC: 5.32 10*6/uL (ref 4.20–5.80)
RDW: 12.9 % (ref 11.0–15.0)
WBC: 8.9 10*3/uL (ref 3.8–10.8)

## 2020-07-28 LAB — COMPREHENSIVE METABOLIC PANEL
AG Ratio: 2 (calc) (ref 1.0–2.5)
ALT: 13 U/L (ref 9–46)
AST: 13 U/L (ref 10–35)
Albumin: 4.3 g/dL (ref 3.6–5.1)
Alkaline phosphatase (APISO): 64 U/L (ref 35–144)
BUN: 21 mg/dL (ref 7–25)
CO2: 27 mmol/L (ref 20–32)
Calcium: 9.2 mg/dL (ref 8.6–10.3)
Chloride: 103 mmol/L (ref 98–110)
Creat: 0.79 mg/dL (ref 0.70–1.25)
Globulin: 2.2 g/dL (calc) (ref 1.9–3.7)
Glucose, Bld: 117 mg/dL — ABNORMAL HIGH (ref 65–99)
Potassium: 3.9 mmol/L (ref 3.5–5.3)
Sodium: 139 mmol/L (ref 135–146)
Total Bilirubin: 0.4 mg/dL (ref 0.2–1.2)
Total Protein: 6.5 g/dL (ref 6.1–8.1)

## 2020-07-28 LAB — URINALYSIS, ROUTINE W REFLEX MICROSCOPIC
Bilirubin Urine: NEGATIVE
Hgb urine dipstick: NEGATIVE
Ketones, ur: NEGATIVE
Leukocytes,Ua: NEGATIVE
Nitrite: NEGATIVE
Protein, ur: NEGATIVE
Specific Gravity, Urine: 1.03 (ref 1.001–1.03)
pH: <=5 (ref 5.0–8.0)

## 2020-07-28 LAB — TSH: TSH: 1.67 mIU/L (ref 0.40–4.50)

## 2020-07-28 LAB — LIPID PANEL
Cholesterol: 193 mg/dL (ref <200)
HDL: 33 mg/dL — ABNORMAL LOW (ref >=40)
LDL Cholesterol (Calc): 128 mg/dL (calc) — ABNORMAL HIGH
Non-HDL Cholesterol (Calc): 160 mg/dL (calc) — ABNORMAL HIGH (ref <130)
Total CHOL/HDL Ratio: 5.8 (calc) — ABNORMAL HIGH (ref <5.0)
Triglycerides: 184 mg/dL — ABNORMAL HIGH (ref <150)

## 2020-07-28 LAB — LIPASE: Lipase: 23 U/L (ref 7–60)

## 2020-07-29 ENCOUNTER — Ambulatory Visit: Payer: BC Managed Care – PPO | Admitting: Endocrinology

## 2020-07-29 ENCOUNTER — Other Ambulatory Visit: Payer: Self-pay

## 2020-07-29 VITALS — BP 132/82 | HR 102 | Ht 69.0 in | Wt 174.0 lb

## 2020-07-29 DIAGNOSIS — E119 Type 2 diabetes mellitus without complications: Secondary | ICD-10-CM

## 2020-07-29 DIAGNOSIS — Z794 Long term (current) use of insulin: Secondary | ICD-10-CM

## 2020-07-29 LAB — POCT GLYCOSYLATED HEMOGLOBIN (HGB A1C): Hemoglobin A1C: 6.9 % — AB (ref 4.0–5.6)

## 2020-07-29 MED ORDER — TRULICITY 3 MG/0.5ML ~~LOC~~ SOAJ: 3.0000 mg | SUBCUTANEOUS | 11 refills | Status: DC

## 2020-07-29 MED ORDER — PIOGLITAZONE HCL 15 MG PO TABS
15.0000 mg | ORAL_TABLET | Freq: Every day | ORAL | 3 refills | Status: DC
Start: 2020-07-29 — End: 2020-08-20

## 2020-07-29 NOTE — Progress Notes (Signed)
Subjective:    Patient ID: Wayne Diaz, male    DOB: Jul 14, 1961, 59 y.o.   MRN: [id number]  HPI Pt returns for f/u of diabetes mellitus: DM type: Insulin-requiring type 2 Dx'ed: 2002 Complications: none Therapy: insulin since 2010, Trulicity, and 4 oral meds.  DKA: never.  Severe hypoglycemia: never.  Pancreatitis: never.   Other: he declines multiple daily injections.   Interval history: no cbg record, but states cbg's are in the 100's.  He says he never misses meds.  He has lost weight. diarrhea is resolved.  Past Medical History:  Diagnosis Date  . ALLERGIC RHINITIS 05/05/2008  . ASTHMA 05/05/2008  . DIABETES MELLITUS, TYPE II 04/28/2008  . GERD 05/05/2008  . HYPERLIPIDEMIA 05/05/2008  . HYPERSOMNIA 03/10/2009  . SMOKER 03/10/2009    Past Surgical History:  Procedure Laterality Date  . s/p left knee surgury     ACL repair  . TONSILLECTOMY      Social History   Socioeconomic History  . Marital status: Married    Spouse name: Not on file  . Number of children: 4  . Years of education: 30  . Highest education level: Not on file  Occupational History  . Occupation: Photographer business  Tobacco Use  . Smoking status: Current Every Day Smoker    Packs/day: 1.00    Years: 30.00    Pack years: 30.00    Types: Cigarettes  . Smokeless tobacco: Never Used  Substance and Sexual Activity  . Alcohol use: No  . Drug use: No  . Sexual activity: Not on file  Other Topics Concern  . Not on file  Social History Narrative   Born and raised in [place], [place]. Raised [place]. Currently resides in house with his wife and daughter. Fun: Yard work, baseball.   Denies religious beliefs effecting health care.    Social Determinants of Health   Financial Resource Strain:   . Difficulty of Paying Living Expenses: Not on file  Food Insecurity:   . Worried About Programme researcher, broadcasting/film/video in the Last Year: Not on file  . Ran Out of Food in the Last Year: Not on file  Transportation  Needs:   . Lack of Transportation (Medical): Not on file  . Lack of Transportation (Non-Medical): Not on file  Physical Activity:   . Days of Exercise per Week: Not on file  . Minutes of Exercise per Session: Not on file  Stress:   . Feeling of Stress : Not on file  Social Connections:   . Frequency of Communication with Friends and Family: Not on file  . Frequency of Social Gatherings with Friends and Family: Not on file  . Attends Religious Services: Not on file  . Active Member of Clubs or Organizations: Not on file  . Attends Banker Meetings: Not on file  . Marital Status: Not on file  Intimate Partner Violence:   . Fear of Current or Ex-Partner: Not on file  . Emotionally Abused: Not on file  . Physically Abused: Not on file  . Sexually Abused: Not on file    Current Outpatient Medications on File Prior to Visit  Medication Sig Dispense Refill  . dapagliflozin propanediol (FARXIGA) 5 MG TABS tablet Take 1 tablet (5 mg total) by mouth daily before breakfast. 90 tablet 3  . glucose blood (ONE TOUCH ULTRA TEST) test strip 1 each by Other route 2 (two) times daily. And lancets 2/day. 180 each 3  . insulin  glargine (LANTUS SOLOSTAR) 100 UNIT/ML Solostar Pen Inject 15 Units into the skin every morning. 10 pen 3  . Lancet Devices (ONE TOUCH DELICA LANCING DEV) MISC Used to check blood sugars. 1 each 0  . naproxen (NAPROSYN) 500 MG tablet Take 500 mg by mouth 2 (two) times daily.    . SitaGLIPtin-MetFORMIN HCl (JANUMET XR) 50-1000 MG TB24 Take 1 tablet by mouth 2 (two) times daily. 180 tablet 3  . vardenafil (LEVITRA) 20 MG tablet Take 1 tablet (20 mg total) by mouth daily as needed for erectile dysfunction. (Patient taking differently: Take 20 mg by mouth daily as needed for erectile dysfunction. WILL PURCHASE WITHOUT USE OF INSURANCE AND WITH THE USE OF GOOD RX CARD) 6 tablet 11   No current facility-administered medications on file prior to visit.    No Known  Allergies  Family History  Problem Relation Age of Onset  . Arthritis Other   . Hyperlipidemia Other   . Heart disease Other   . Hypertension Other   . Diabetes Other     BP 132/82   Pulse (!) 102   Ht 5\' 9"  (1.753 m)   Wt 174 lb (78.9 kg)   SpO2 95%   BMI 25.70 kg/m    Review of Systems He denies hypoglycemia.     Objective:   Physical Exam VITAL SIGNS:  See vs page GENERAL: no distress Pulses: dorsalis pedis intact bilat.   MSK: no deformity of the feet CV: no leg edema Skin:  no ulcer on the feet.  normal color and temp on the feet. Neuro: sensation is intact to touch on the feet.     Lab Results  Component Value Date   HGBA1C 6.9 (A) 05/27/2020       Assessment & Plan:  Insulin-requiring type 2 DM:  He can have a trial off rx.   Patient Instructions  I have sent a prescription to your pharmacy, to double the Trulicity, and:  Stop taking the insulin, and: Please continue the same other medications check your blood sugar twice a day.  vary the time of day when you check, between before the 3 meals, and at bedtime.  also check if you have symptoms of your blood sugar being too high or too low.  please keep a record of the readings and bring it to your next appointment here (or you can bring the meter itself).  You can write it on any piece of paper.  please call 05/29/2020 sooner if your blood sugar goes below 70, or if you have a lot of readings over 200. Please come back for a follow-up appointment in 2 months.

## 2020-07-29 NOTE — Patient Instructions (Addendum)
I have sent a prescription to your pharmacy, to double the Trulicity, and:  Stop taking the insulin, and: Please continue the same other medications check your blood sugar twice a day.  vary the time of day when you check, between before the 3 meals, and at bedtime.  also check if you have symptoms of your blood sugar being too high or too low.  please keep a record of the readings and bring it to your next appointment here (or you can bring the meter itself).  You can write it on any piece of paper.  please call us sooner if your blood sugar goes below 70, or if you have a lot of readings over 200. Please come back for a follow-up appointment in 2 months.

## 2020-08-01 NOTE — Progress Notes (Signed)
Please inform patient of the following:  Blood work is NORMAL. We will contact him with results of his CT scan once we have them.  Katina Degree. Jimmey Ralph, MD 08/01/2020 9:33 PM

## 2020-08-20 ENCOUNTER — Other Ambulatory Visit: Payer: Self-pay | Admitting: Endocrinology

## 2020-08-30 ENCOUNTER — Other Ambulatory Visit: Payer: Self-pay

## 2020-08-30 ENCOUNTER — Ambulatory Visit (INDEPENDENT_AMBULATORY_CARE_PROVIDER_SITE_OTHER)
Admission: RE | Admit: 2020-08-30 | Discharge: 2020-08-30 | Disposition: A | Discharge status: Home / Self Care | Payer: Self-pay | Source: Ambulatory Visit | Attending: Family Medicine | Admitting: Family Medicine

## 2020-08-30 DIAGNOSIS — Z8249 Family history of ischemic heart disease and other diseases of the circulatory system: Secondary | ICD-10-CM

## 2020-08-31 ENCOUNTER — Encounter: Payer: Self-pay | Admitting: Family Medicine

## 2020-09-01 NOTE — Progress Notes (Signed)
Please inform patient of the following:  He has calcium buildup in the arteries in his heart.  This is not an imminent cause for concern but should be monitored closely.  Recommend starting Lipitor 40 mg daily and referring him to see cardiology.

## 2020-10-08 ENCOUNTER — Encounter: Payer: Self-pay | Admitting: Family Medicine

## 2020-10-10 ENCOUNTER — Other Ambulatory Visit: Payer: Self-pay

## 2020-10-10 DIAGNOSIS — I2510 Atherosclerotic heart disease of native coronary artery without angina pectoris: Secondary | ICD-10-CM

## 2020-10-10 DIAGNOSIS — I2584 Coronary atherosclerosis due to calcified coronary lesion: Secondary | ICD-10-CM

## 2020-10-10 MED ORDER — ATORVASTATIN CALCIUM 40 MG PO TABS: 40.0000 mg | ORAL_TABLET | Freq: Every day | ORAL | 3 refills | Status: DC

## 2020-10-14 ENCOUNTER — Encounter: Payer: Self-pay | Admitting: Podiatry

## 2020-10-14 ENCOUNTER — Ambulatory Visit: Payer: BC Managed Care – PPO | Admitting: Podiatry

## 2020-10-14 ENCOUNTER — Other Ambulatory Visit: Payer: Self-pay

## 2020-10-14 ENCOUNTER — Other Ambulatory Visit: Payer: Self-pay | Admitting: Endocrinology

## 2020-10-14 DIAGNOSIS — Z794 Long term (current) use of insulin: Secondary | ICD-10-CM | POA: Diagnosis not present

## 2020-10-14 DIAGNOSIS — E119 Type 2 diabetes mellitus without complications: Secondary | ICD-10-CM | POA: Diagnosis not present

## 2020-10-14 DIAGNOSIS — L03119 Cellulitis of unspecified part of limb: Secondary | ICD-10-CM | POA: Diagnosis not present

## 2020-10-14 DIAGNOSIS — N529 Male erectile dysfunction, unspecified: Secondary | ICD-10-CM

## 2020-10-14 DIAGNOSIS — L02619 Cutaneous abscess of unspecified foot: Secondary | ICD-10-CM

## 2020-10-20 ENCOUNTER — Encounter: Payer: Self-pay | Admitting: Family Medicine

## 2020-10-20 NOTE — Progress Notes (Signed)
Subjective:  Patient ID: Wayne Diaz, male    DOB: 1961-09-16,  MRN: [id number]  59 y.o. male presents with follow up diabetic foot check. He has h/o spider bite with cellulitis/abscess of left foot which resolved  with local wound care and oral antibiotics.    He states his foot feels fine. He also states he feels he can manage his own toenails for now. He voices no new pedal problems on today's visit.  PCP: Ardith Dark, MD and last visit was: 07/27/2020.  He is also followed by Dr. Romero Belling for his diabetes and last visit was 07/29/2020.  Review of Systems: Negative except as noted in the HPI.  Past Medical History:  Diagnosis Date  . ALLERGIC RHINITIS 05/05/2008  . ASTHMA 05/05/2008  . DIABETES MELLITUS, TYPE II 04/28/2008  . GERD 05/05/2008  . HYPERLIPIDEMIA 05/05/2008  . HYPERSOMNIA 03/10/2009  . SMOKER 03/10/2009   Past Surgical History:  Procedure Laterality Date  . s/p left knee surgury     ACL repair  . TONSILLECTOMY     Patient Active Problem List   Diagnosis Date Noted  . Neck pain 01/19/2019  . Anxiety state 08/26/2014  . Hypersomnia 09/11/2013  . Olecranon bursitis of right elbow 10/14/2012  . Gout 03/07/2012  . BACK PAIN 06/22/2010  . Nicotine dependence with current use 03/10/2009  . HYPERSOMNIA 03/10/2009  . Hyperlipidemia 05/05/2008  . ALLERGIC RHINITIS 05/05/2008  . ASTHMA 05/05/2008  . GERD 05/05/2008  . Diabetes (HCC) 04/28/2008    Current Outpatient Medications:  .  atorvastatin (LIPITOR) 40 MG tablet, Take 1 tablet (40 mg total) by mouth daily., Disp: 90 tablet, Rfl: 3 .  dapagliflozin propanediol (FARXIGA) 5 MG TABS tablet, Take 1 tablet (5 mg total) by mouth daily before breakfast., Disp: 90 tablet, Rfl: 3 .  Dulaglutide (TRULICITY) 3 MG/0.5ML SOPN, Inject 3 mg into the skin once a week., Disp: 6 mL, Rfl: 11 .  glucose blood (ONE TOUCH ULTRA TEST) test strip, 1 each by Other route 2 (two) times daily. And lancets 2/day., Disp: 180 each, Rfl: 3 .   insulin glargine (LANTUS SOLOSTAR) 100 UNIT/ML Solostar Pen, Inject 15 Units into the skin every morning., Disp: 10 pen, Rfl: 3 .  Lancet Devices (ONE TOUCH DELICA LANCING DEV) MISC, Used to check blood sugars., Disp: 1 each, Rfl: 0 .  naproxen (NAPROSYN) 500 MG tablet, Take 500 mg by mouth 2 (two) times daily., Disp: , Rfl:  .  pioglitazone (ACTOS) 15 MG tablet, TAKE 1 TABLET BY MOUTH EVERY DAY, Disp: 90 tablet, Rfl: 3 .  SitaGLIPtin-MetFORMIN HCl (JANUMET XR) 50-1000 MG TB24, Take 1 tablet by mouth 2 (two) times daily., Disp: 180 tablet, Rfl: 3 .  vardenafil (LEVITRA) 20 MG tablet, TAKE 1 TABLET BY MOUTH EVERY DAY AS NEEDED FOR ERECTILE DYSFUNCTION, Disp: 6 tablet, Rfl: 2 No Known Allergies Social History   Tobacco Use  Smoking Status Current Every Day Smoker  . Packs/day: 1.00  . Years: 30.00  . Pack years: 30.00  . Types: Cigarettes  Smokeless Tobacco Never Used    Objective:  There were no vitals filed for this visit. Constitutional Patient is a pleasant 59 y.o.  male in NAD. AAO x 3.  Vascular Capillary refill time to digits immediate b/l. Palpable pedal pulses b/l LE. Pedal hair present. Lower extremity skin temperature gradient within normal limits. No cyanosis or clubbing noted.  Neurologic Normal speech. Protective sensation intact 5/5 intact bilaterally with 10g monofilament b/l. Vibratory sensation  intact b/l.  Dermatologic Pedal skin with normal turgor, texture and tone bilaterally. No open wounds bilaterally. No interdigital macerations bilaterally. Toenails 1-5 b/l well maintained with adequate length. No erythema, no edema, no drainage, no fluctuance.  Orthopedic: Normal muscle strength 5/5 to all lower extremity muscle groups bilaterally. No pain crepitus or joint limitation noted with ROM b/l. No gross bony deformities bilaterally. Patient ambulates independent of any assistive aids.   Hemoglobin A1C Latest Ref Rng & Units 07/29/2020 05/27/2020 03/25/2020 01/22/2020 10/23/2019   HGBA1C 4.0 - 5.6 % 6.9(A) 6.9(A) 6.9(A) 7.3(A) 7.4(A)  Some recent data might be hidden    Assessment:   1. Type 2 diabetes mellitus without complication, with long-term current use of insulin (HCC)   2. Cellulitis and abscess of foot, except toes    Plan:  Patient was evaluated and treated and all questions answered.  -Examined patient. -Patient to continue soft, supportive shoe gear daily. -Patient to report any pedal injuries to medical professional immediately. -Patient/POA to call should there be question/concern in the interim.  Return in about 10 months (around 07/31/2021) for annual diabeteic foot examination.  Freddie Breech, DPM

## 2020-10-21 ENCOUNTER — Encounter: Payer: Self-pay | Admitting: Endocrinology

## 2020-10-21 ENCOUNTER — Ambulatory Visit: Payer: BC Managed Care – PPO | Admitting: Endocrinology

## 2020-10-21 ENCOUNTER — Other Ambulatory Visit: Payer: Self-pay

## 2020-10-21 VITALS — BP 116/82 | HR 72 | Ht 69.0 in | Wt 163.0 lb

## 2020-10-21 DIAGNOSIS — Z794 Long term (current) use of insulin: Secondary | ICD-10-CM | POA: Diagnosis not present

## 2020-10-21 DIAGNOSIS — E119 Type 2 diabetes mellitus without complications: Secondary | ICD-10-CM

## 2020-10-21 LAB — POCT GLYCOSYLATED HEMOGLOBIN (HGB A1C): Hemoglobin A1C: 7.9 % — AB (ref 4.0–5.6)

## 2020-10-21 MED ORDER — PIOGLITAZONE HCL 45 MG PO TABS: 45.0000 mg | ORAL_TABLET | Freq: Every day | ORAL | 3 refills | Status: DC

## 2020-10-21 NOTE — Progress Notes (Signed)
Subjective:    Patient ID: Wayne Diaz, male    DOB: 1961/08/30, 59 y.o.   MRN: [id number]  HPI Pt returns for f/u of diabetes mellitus: DM type: 2 Dx'ed: 2002 Complications: none Therapy: Trulicity and 4 oral meds.  DKA: never.  Severe hypoglycemia: never.  Pancreatitis: never.   Other: he declines multiple daily injections; he took insulin 2010-2021  Interval history: no cbg record, but states cbg's vary from 140-227.  He says he never misses meds.   Past Medical History:  Diagnosis Date  . ALLERGIC RHINITIS 05/05/2008  . ASTHMA 05/05/2008  . DIABETES MELLITUS, TYPE II 04/28/2008  . GERD 05/05/2008  . HYPERLIPIDEMIA 05/05/2008  . HYPERSOMNIA 03/10/2009  . SMOKER 03/10/2009    Past Surgical History:  Procedure Laterality Date  . s/p left knee surgury     ACL repair  . TONSILLECTOMY      Social History   Socioeconomic History  . Marital status: Married    Spouse name: Not on file  . Number of children: 4  . Years of education: 61  . Highest education level: Not on file  Occupational History  . Occupation: Photographer business  Tobacco Use  . Smoking status: Current Every Day Smoker    Packs/day: 1.00    Years: 30.00    Pack years: 30.00    Types: Cigarettes  . Smokeless tobacco: Never Used  Substance and Sexual Activity  . Alcohol use: No  . Drug use: No  . Sexual activity: Not on file  Other Topics Concern  . Not on file  Social History Narrative   Born and raised in [place], [place]. Raised [place]. Currently resides in house with his wife and daughter. Fun: Yard work, baseball.   Denies religious beliefs effecting health care.    Social Determinants of Health   Financial Resource Strain: Not on file  Food Insecurity: Not on file  Transportation Needs: Not on file  Physical Activity: Not on file  Stress: Not on file  Social Connections: Not on file  Intimate Partner Violence: Not on file    Current Outpatient Medications on File Prior to Visit   Medication Sig Dispense Refill  . atorvastatin (LIPITOR) 40 MG tablet Take 1 tablet (40 mg total) by mouth daily. 90 tablet 3  . dapagliflozin propanediol (FARXIGA) 5 MG TABS tablet Take 1 tablet (5 mg total) by mouth daily before breakfast. 90 tablet 3  . Dulaglutide (TRULICITY) 3 MG/0.5ML SOPN Inject 3 mg into the skin once a week. 6 mL 11  . glucose blood (ONE TOUCH ULTRA TEST) test strip 1 each by Other route 2 (two) times daily. And lancets 2/day. 180 each 3  . insulin glargine (LANTUS SOLOSTAR) 100 UNIT/ML Solostar Pen Inject 15 Units into the skin every morning. 10 pen 3  . Lancet Devices (ONE TOUCH DELICA LANCING DEV) MISC Used to check blood sugars. 1 each 0  . naproxen (NAPROSYN) 500 MG tablet Take 500 mg by mouth 2 (two) times daily.    . SitaGLIPtin-MetFORMIN HCl (JANUMET XR) 50-1000 MG TB24 Take 1 tablet by mouth 2 (two) times daily. 180 tablet 3  . vardenafil (LEVITRA) 20 MG tablet TAKE 1 TABLET BY MOUTH EVERY DAY AS NEEDED FOR ERECTILE DYSFUNCTION 6 tablet 2   No current facility-administered medications on file prior to visit.    No Known Allergies  Family History  Problem Relation Age of Onset  . Arthritis Other   . Hyperlipidemia Other   .  Heart disease Other   . Hypertension Other   . Diabetes Other     BP 116/82   Pulse 72   Ht 5\' 9"  (1.753 m)   Wt 163 lb (73.9 kg)   SpO2 94%   BMI 24.07 kg/m    Review of Systems He has lost 10 more lbs.      Objective:   Physical Exam VITAL SIGNS:  See vs page GENERAL: no distress Pulses: dorsalis pedis intact bilat.   MSK: no deformity of the feet CV: no leg edema Skin:  no ulcer on the feet.  normal color and temp on the feet. Neuro: sensation is intact to touch on the feet   Lab Results  Component Value Date   HGBA1C 7.9 (A) 10/21/2020       Assessment & Plan:  Type 2 DM: uncontrolled Weight loss, ongoing.  We won't increase Trulicity.   Patient Instructions  I have sent a prescription to your  pharmacy, to increase the pioglitazone, and:  Please continue the same other medications check your blood sugar twice a day.  vary the time of day when you check, between before the 3 meals, and at bedtime.  also check if you have symptoms of your blood sugar being too high or too low.  please keep a record of the readings and bring it to your next appointment here (or you can bring the meter itself).  You can write it on any piece of paper.  please call 10/23/2020 sooner if your blood sugar goes below 70, or if you have a lot of readings over 200.  Please come back for a follow-up appointment in 2 months.

## 2020-10-21 NOTE — Patient Instructions (Addendum)
I have sent a prescription to your pharmacy, to increase the pioglitazone, and:  Please continue the same other medications check your blood sugar twice a day.  vary the time of day when you check, between before the 3 meals, and at bedtime.  also check if you have symptoms of your blood sugar being too high or too low.  please keep a record of the readings and bring it to your next appointment here (or you can bring the meter itself).  You can write it on any piece of paper.  please call us sooner if your blood sugar goes below 70, or if you have a lot of readings over 200.  Please come back for a follow-up appointment in 2 months.

## 2020-11-25 ENCOUNTER — Telehealth: Payer: BC Managed Care – PPO | Admitting: Family Medicine

## 2020-11-27 DIAGNOSIS — Z20822 Contact with and (suspected) exposure to covid-19: Secondary | ICD-10-CM | POA: Diagnosis not present

## 2020-12-05 ENCOUNTER — Encounter: Payer: Self-pay | Admitting: Family Medicine

## 2021-01-13 ENCOUNTER — Ambulatory Visit: Payer: BC Managed Care – PPO | Admitting: Family Medicine

## 2021-01-20 ENCOUNTER — Encounter: Payer: Self-pay | Admitting: Endocrinology

## 2021-01-20 ENCOUNTER — Other Ambulatory Visit: Payer: Self-pay

## 2021-01-20 ENCOUNTER — Encounter: Payer: Self-pay | Admitting: Family Medicine

## 2021-01-20 ENCOUNTER — Ambulatory Visit: Payer: BC Managed Care – PPO | Admitting: Family Medicine

## 2021-01-20 ENCOUNTER — Ambulatory Visit: Payer: BC Managed Care – PPO | Admitting: Endocrinology

## 2021-01-20 ENCOUNTER — Telehealth: Payer: Self-pay | Admitting: Endocrinology

## 2021-01-20 VITALS — BP 114/68 | HR 112 | Ht 68.5 in | Wt 149.0 lb

## 2021-01-20 VITALS — BP 101/68 | HR 99 | Temp 98.5°F | Ht 68.5 in | Wt 153.0 lb

## 2021-01-20 DIAGNOSIS — Z794 Long term (current) use of insulin: Secondary | ICD-10-CM

## 2021-01-20 DIAGNOSIS — R634 Abnormal weight loss: Secondary | ICD-10-CM | POA: Insufficient documentation

## 2021-01-20 DIAGNOSIS — E119 Type 2 diabetes mellitus without complications: Secondary | ICD-10-CM

## 2021-01-20 DIAGNOSIS — Z125 Encounter for screening for malignant neoplasm of prostate: Secondary | ICD-10-CM

## 2021-01-20 LAB — PSA: PSA: 0.38 ng/mL (ref 0.10–4.00)

## 2021-01-20 LAB — TESTOSTERONE: Testosterone: 396.95 ng/dL (ref 300.00–890.00)

## 2021-01-20 LAB — POCT GLYCOSYLATED HEMOGLOBIN (HGB A1C): Hemoglobin A1C: 7.9 % — AB (ref 4.0–5.6)

## 2021-01-20 MED ORDER — METFORMIN HCL ER 500 MG PO TB24: 2000.0000 mg | ORAL_TABLET | Freq: Every day | ORAL | 3 refills | Status: DC

## 2021-01-20 NOTE — Assessment & Plan Note (Signed)
Managed by endocrinology.  He is currently on Actos, Metformin, Trulicity, and [place].  All these could be contributing to his issues with weight loss.

## 2021-01-20 NOTE — Telephone Encounter (Signed)
Pt states when he came to his appt today Dr said he would send in a prescription for Repaglinide, pt called his pharmacy and they don't have a prescription for this. Pt just wants to know if he is still going to be taking this?   If Dr does want him to start it please send to:  [id number] - [place], [place] - [address] Phone:  [phone]  Fax:  [phone]

## 2021-01-20 NOTE — Progress Notes (Signed)
   Wayne Diaz is a 60 y.o. male who presents today for an office visit.  Assessment/Plan:  Chronic Problems Addressed Today: Weight loss Thankfully weight symptoms have stabilized some.  Though he is down about 30 pounds since our visit 6 months ago.  Unclear etiology though could be related to diabetes meds.  He adamantly refused colon cancer screening at our last visit.  We will check PSA and testosterone levels today.  May need abdominal CT scan if blood work is normal and weight loss persists.  Discussed importance of good diet and high calorie intake.  May need referral to GI if symptoms persist without obvious explanation.  Follow-up 3 to 6 months.  Diabetes Managed by endocrinology.  He is currently on Actos, Metformin, Trulicity, and [place].  All these could be contributing to his issues with weight loss.    Subjective:  HPI:  Patient here for follow up. Seen about 6 months ago.  There was concern for weight loss at that time.  Patient states he has lost about 60 pounds from January of last year to November of last year.  Weight has been relatively stable since then though he may have lost about 5 pounds over the last few months or so.  At her last visit we checked several labs including CBC, CMET, TSH, and lipase.  All these were negative.  Additionally had a cardiac CT scan which did not show any concerning lesions.  He has been following with endocrinology for his diabetes.  He would like to have testosterone level checked today.  He is trying to eat a healthy diet.  No nausea or vomiting.  No reported constipation or diarrhea.  No abdominal pain.       Objective:  Physical Exam: BP 101/68   Pulse 99   Temp 98.5 F (36.9 C) (Temporal)   Ht 5' 8.5" (1.74 m)   Wt 153 lb (69.4 kg)   SpO2 97%   BMI 22.93 kg/m    Wt Readings from Last 3 Encounters:  01/20/21 153 lb (69.4 kg)  01/20/21 149 lb (67.6 kg)  10/21/20 163 lb (73.9 kg)  Gen: No acute distress, resting  comfortably CV: Regular rate and rhythm with no murmurs appreciated Pulm: Normal work of breathing, clear to auscultation bilaterally with no crackles, wheezes, or rhonchi GI: Bowel sounds present, soft, nontender, nondistended. Neuro: Grossly normal, moves all extremities Psych: Normal affect and thought content      Wayne Diaz M. Jimmey Ralph, MD 01/20/2021 10:10 AM

## 2021-01-20 NOTE — Progress Notes (Signed)
Subjective:    Patient ID: Wayne Diaz, male    DOB: [birth year], 60 y.o.   MRN: [id number]  HPI Pt returns for f/u of diabetes mellitus: DM type: 2 Dx'ed: 2002 Complications: none Therapy: Trulicity and 4 oral meds.  DKA: never.  Severe hypoglycemia: never.  Pancreatitis: never.   Other: he declines multiple daily injections; he took insulin 2010-2021.   Interval history: He brings his meter with his cbg's which I have reviewed today.  cbg varies from 98-255.  He checks fasting only.  He says he never misses meds.   Past Medical History:  Diagnosis Date  . ALLERGIC RHINITIS 05/05/2008  . ASTHMA 05/05/2008  . DIABETES MELLITUS, TYPE II 04/28/2008  . GERD 05/05/2008  . HYPERLIPIDEMIA 05/05/2008  . HYPERSOMNIA 03/10/2009  . SMOKER 03/10/2009    Past Surgical History:  Procedure Laterality Date  . s/p left knee surgury     ACL repair  . TONSILLECTOMY      Social History   Socioeconomic History  . Marital status: Married    Spouse name: Not on file  . Number of children: 4  . Years of education: 81  . Highest education level: Not on file  Occupational History  . Occupation: Photographer business  Tobacco Use  . Smoking status: Current Every Day Smoker    Packs/day: 1.00    Years: 30.00    Pack years: 30.00    Types: Cigarettes  . Smokeless tobacco: Never Used  Substance and Sexual Activity  . Alcohol use: No  . Drug use: No  . Sexual activity: Not on file  Other Topics Concern  . Not on file  Social History Narrative   Born and raised in [place], [place]. Raised [place]. Currently resides in house with his wife and daughter. Fun: Yard work, baseball.   Denies religious beliefs effecting health care.    Social Determinants of Health   Financial Resource Strain: Not on file  Food Insecurity: Not on file  Transportation Needs: Not on file  Physical Activity: Not on file  Stress: Not on file  Social Connections: Not on file  Intimate Partner Violence: Not on file     Current Outpatient Medications on File Prior to Visit  Medication Sig Dispense Refill  . atorvastatin (LIPITOR) 40 MG tablet Take 1 tablet (40 mg total) by mouth daily. 90 tablet 3  . dapagliflozin propanediol (FARXIGA) 5 MG TABS tablet Take 1 tablet (5 mg total) by mouth daily before breakfast. 90 tablet 3  . Dulaglutide (TRULICITY) 3 MG/0.5ML SOPN Inject 3 mg into the skin once a week. 6 mL 11  . glucose blood (ONE TOUCH ULTRA TEST) test strip 1 each by Other route 2 (two) times daily. And lancets 2/day. 180 each 3  . Lancet Devices (ONE TOUCH DELICA LANCING DEV) MISC Used to check blood sugars. 1 each 0  . naproxen (NAPROSYN) 500 MG tablet Take 500 mg by mouth 2 (two) times daily.    . pioglitazone (ACTOS) 45 MG tablet Take 1 tablet (45 mg total) by mouth daily. 90 tablet 3  . vardenafil (LEVITRA) 20 MG tablet TAKE 1 TABLET BY MOUTH EVERY DAY AS NEEDED FOR ERECTILE DYSFUNCTION 6 tablet 2   No current facility-administered medications on file prior to visit.    No Known Allergies  Family History  Problem Relation Age of Onset  . Arthritis Other   . Hyperlipidemia Other   . Heart disease Other   . Hypertension Other   .  Diabetes Other     BP 114/68   Pulse (!) 112   Ht 5' 8.5" (1.74 m)   Wt 149 lb (67.6 kg)   SpO2 96%   BMI 22.33 kg/m    Review of Systems He continues to lose weight.  Denies n/v.      Objective:   Physical Exam VITAL SIGNS:  See vs page GENERAL: no distress Pulses: dorsalis pedis intact bilat.   MSK: no deformity of the feet CV: no leg edema Skin:  no ulcer on the feet.  normal color and temp on the feet. Neuro: sensation is intact to touch on the feet  Lab Results  Component Value Date   HGBA1C 7.9 (A) 01/20/2021        Assessment & Plan:  Type 2 DM: uncontrolled.  Weight loss, poss due to Trulicity, but other causes should be considered.   Patient Instructions  I have sent a prescription to your pharmacy, to add repaglinide.    Please continue the same other medications.  Please see Dr Jimmey Ralph, to see if there is another cause of the weight loss, other than the Trulicity.   I have also sent a prescription to your pharmacy, to change Janumet to just the metformin.  check your blood sugar twice a day.  vary the time of day when you check, between before the 3 meals, and at bedtime.  also check if you have symptoms of your blood sugar being too high or too low.  please keep a record of the readings and bring it to your next appointment here (or you can bring the meter itself).  You can write it on any piece of paper.  please call us sooner if your blood sugar goes below 70, or if you have a lot of readings over 200.  Please come back for a follow-up appointment in 2 months.

## 2021-01-20 NOTE — Patient Instructions (Signed)
It was very nice to see you today!  We will check blood work today.  Depending on the results we may need to get a CT scan.  Please continue doing the best you can with eating high calorie diet.  Take care, Dr Jimmey Ralph  PLEASE NOTE:  If you had any lab tests please let us know if you have not heard back within a few days. You may see your results on mychart before we have a chance to review them but we will give you a call once they are reviewed by [place]. If we ordered any referrals today, please let us know if you have not heard from their office within the next week.   Please try these tips to maintain a healthy lifestyle:   Eat at least 3 REAL meals and 1-2 snacks per day.  Aim for no more than 5 hours between eating.  If you eat breakfast, please do so within one hour of getting up.    Each meal should contain half fruits/vegetables, one quarter protein, and one quarter carbs (no bigger than a computer mouse)   Cut down on sweet beverages. This includes juice, soda, and sweet tea.     Drink at least 1 glass of water with each meal and aim for at least 8 glasses per day   Exercise at least 150 minutes every week.

## 2021-01-20 NOTE — Patient Instructions (Addendum)
I have sent a prescription to your pharmacy, to add repaglinide.   Please continue the same other medications.  Please see Dr Jimmey Ralph, to see if there is another cause of the weight loss, other than the Trulicity.   I have also sent a prescription to your pharmacy, to change Janumet to just the metformin.  check your blood sugar twice a day.  vary the time of day when you check, between before the 3 meals, and at bedtime.  also check if you have symptoms of your blood sugar being too high or too low.  please keep a record of the readings and bring it to your next appointment here (or you can bring the meter itself).  You can write it on any piece of paper.  please call us sooner if your blood sugar goes below 70, or if you have a lot of readings over 200.  Please come back for a follow-up appointment in 2 months.

## 2021-01-20 NOTE — Telephone Encounter (Signed)
Please Advise

## 2021-01-20 NOTE — Assessment & Plan Note (Addendum)
Thankfully weight symptoms have stabilized some.  Though he is down about 30 pounds since our visit 6 months ago.  Unclear etiology though could be related to diabetes meds.  He adamantly refused colon cancer screening at our last visit.  We will check PSA and testosterone levels today.  May need abdominal CT scan if blood work is normal and weight loss persists.  Discussed importance of good diet and high calorie intake.  May need referral to GI if symptoms persist without obvious explanation.  Follow-up 3 to 6 months.

## 2021-01-21 ENCOUNTER — Encounter: Payer: Self-pay | Admitting: Endocrinology

## 2021-01-21 MED ORDER — REPAGLINIDE 0.5 MG PO TABS: 0.5000 mg | ORAL_TABLET | Freq: Two times a day (BID) | ORAL | 3 refills | Status: DC

## 2021-01-21 NOTE — Telephone Encounter (Signed)
I have sent a prescription to your pharmacy.  Sorry about that, as I was worried about your weight loss.  I'll see you next time.

## 2021-01-23 ENCOUNTER — Other Ambulatory Visit: Payer: Self-pay | Admitting: Endocrinology

## 2021-01-23 DIAGNOSIS — R634 Abnormal weight loss: Secondary | ICD-10-CM

## 2021-01-23 NOTE — Progress Notes (Signed)
Please inform patient of the following:  Labs are NORMAL. We can continue to monitor his weight over the next few weeks or we can refer him to have a CT scan as we discussed at his office visit.  Katina Degree. Jimmey Ralph, MD 01/23/2021 8:48 AM

## 2021-01-24 ENCOUNTER — Other Ambulatory Visit: Payer: Self-pay | Admitting: Endocrinology

## 2021-01-24 DIAGNOSIS — E119 Type 2 diabetes mellitus without complications: Secondary | ICD-10-CM

## 2021-01-24 DIAGNOSIS — Z794 Long term (current) use of insulin: Secondary | ICD-10-CM

## 2021-01-31 ENCOUNTER — Other Ambulatory Visit (INDEPENDENT_AMBULATORY_CARE_PROVIDER_SITE_OTHER): Payer: BC Managed Care – PPO

## 2021-01-31 DIAGNOSIS — E119 Type 2 diabetes mellitus without complications: Secondary | ICD-10-CM | POA: Diagnosis not present

## 2021-01-31 DIAGNOSIS — Z794 Long term (current) use of insulin: Secondary | ICD-10-CM | POA: Diagnosis not present

## 2021-01-31 LAB — BASIC METABOLIC PANEL
BUN: 21 mg/dL (ref 6–23)
CO2: 30 mEq/L (ref 19–32)
Calcium: 9.4 mg/dL (ref 8.4–10.5)
Chloride: 99 mEq/L (ref 96–112)
Creatinine, Ser: 0.76 mg/dL (ref 0.40–1.50)
GFR: 97.42 mL/min (ref >60.00)
Glucose, Bld: 174 mg/dL — ABNORMAL HIGH (ref 70–99)
Potassium: 4.1 mEq/L (ref 3.5–5.1)
Sodium: 137 mEq/L (ref 135–145)

## 2021-02-02 ENCOUNTER — Ambulatory Visit (INDEPENDENT_AMBULATORY_CARE_PROVIDER_SITE_OTHER)
Admission: RE | Admit: 2021-02-02 | Discharge: 2021-02-02 | Disposition: A | Discharge status: Home / Self Care | Payer: BC Managed Care – PPO | Source: Ambulatory Visit | Attending: Endocrinology | Admitting: Endocrinology

## 2021-02-02 ENCOUNTER — Other Ambulatory Visit: Payer: Self-pay

## 2021-02-02 DIAGNOSIS — N281 Cyst of kidney, acquired: Secondary | ICD-10-CM | POA: Diagnosis not present

## 2021-02-02 DIAGNOSIS — R634 Abnormal weight loss: Secondary | ICD-10-CM | POA: Diagnosis not present

## 2021-02-02 DIAGNOSIS — I700 Atherosclerosis of aorta: Secondary | ICD-10-CM | POA: Diagnosis not present

## 2021-02-02 DIAGNOSIS — M5137 Other intervertebral disc degeneration, lumbosacral region: Secondary | ICD-10-CM | POA: Diagnosis not present

## 2021-02-02 MED ORDER — IOHEXOL 300 MG/ML  SOLN
100.0000 mL | Freq: Once | INTRAMUSCULAR | Status: AC | PRN
  Administered 2021-02-02: 100 mL via INTRAVENOUS

## 2021-03-24 ENCOUNTER — Other Ambulatory Visit: Payer: Self-pay

## 2021-03-24 ENCOUNTER — Ambulatory Visit: Payer: BC Managed Care – PPO | Admitting: Endocrinology

## 2021-03-24 VITALS — BP 108/68 | HR 104 | Ht 68.5 in | Wt 147.2 lb

## 2021-03-24 DIAGNOSIS — E119 Type 2 diabetes mellitus without complications: Secondary | ICD-10-CM | POA: Diagnosis not present

## 2021-03-24 DIAGNOSIS — Z794 Long term (current) use of insulin: Secondary | ICD-10-CM

## 2021-03-24 LAB — POCT GLYCOSYLATED HEMOGLOBIN (HGB A1C): Hemoglobin A1C: 6.9 % — AB (ref 4.0–5.6)

## 2021-03-24 MED ORDER — TRULICITY 1.5 MG/0.5ML ~~LOC~~ SOAJ: 1.5000 mg | SUBCUTANEOUS | 3 refills | Status: DC

## 2021-03-24 NOTE — Progress Notes (Signed)
Subjective:    Patient ID: Wayne Diaz, male    DOB: 03-11-1961, 60 y.o.   MRN: [id number]  HPI Pt returns for f/u of diabetes mellitus: DM type: 2 Dx'ed: 2002 Complications: none Therapy: Trulicity and 4 oral meds.  DKA: never.  Severe hypoglycemia: never.  Pancreatitis: never.   Other: he declines multiple daily injections; he took insulin 2010-2021.   Interval history: pt says cbg varies from 120-150.  He says he never misses meds. He continues to lose weight.   Past Medical History:  Diagnosis Date  . ALLERGIC RHINITIS 05/05/2008  . ASTHMA 05/05/2008  . DIABETES MELLITUS, TYPE II 04/28/2008  . GERD 05/05/2008  . HYPERLIPIDEMIA 05/05/2008  . HYPERSOMNIA 03/10/2009  . SMOKER 03/10/2009    Past Surgical History:  Procedure Laterality Date  . s/p left knee surgury     ACL repair  . TONSILLECTOMY      Social History   Socioeconomic History  . Marital status: Married    Spouse name: Not on file  . Number of children: 4  . Years of education: 81  . Highest education level: Not on file  Occupational History  . Occupation: Photographer business  Tobacco Use  . Smoking status: Current Every Day Smoker    Packs/day: 1.00    Years: 30.00    Pack years: 30.00    Types: Cigarettes  . Smokeless tobacco: Never Used  Substance and Sexual Activity  . Alcohol use: No  . Drug use: No  . Sexual activity: Not on file  Other Topics Concern  . Not on file  Social History Narrative   Born and raised in [place], [place]. Raised [place]. Currently resides in house with his wife and daughter. Fun: Yard work, baseball.   Denies religious beliefs effecting health care.    Social Determinants of Health   Financial Resource Strain: Not on file  Food Insecurity: Not on file  Transportation Needs: Not on file  Physical Activity: Not on file  Stress: Not on file  Social Connections: Not on file  Intimate Partner Violence: Not on file    Current Outpatient Medications on File Prior  to Visit  Medication Sig Dispense Refill  . atorvastatin (LIPITOR) 40 MG tablet Take 1 tablet (40 mg total) by mouth daily. 90 tablet 3  . dapagliflozin propanediol (FARXIGA) 5 MG TABS tablet Take 1 tablet (5 mg total) by mouth daily before breakfast. 90 tablet 3  . Dulaglutide (TRULICITY) 3 MG/0.5ML SOPN Inject 3 mg into the skin once a week. 6 mL 11  . glucose blood (ONE TOUCH ULTRA TEST) test strip 1 each by Other route 2 (two) times daily. And lancets 2/day. 180 each 3  . Lancet Devices (ONE TOUCH DELICA LANCING DEV) MISC Used to check blood sugars. 1 each 0  . metFORMIN (GLUCOPHAGE-XR) 500 MG 24 hr tablet Take 4 tablets (2,000 mg total) by mouth daily. 360 tablet 3  . naproxen (NAPROSYN) 500 MG tablet Take 500 mg by mouth 2 (two) times daily.    . pioglitazone (ACTOS) 45 MG tablet Take 1 tablet (45 mg total) by mouth daily. 90 tablet 3  . repaglinide (PRANDIN) 0.5 MG tablet Take 1 tablet (0.5 mg total) by mouth 2 (two) times daily before a meal. 180 tablet 3  . vardenafil (LEVITRA) 20 MG tablet TAKE 1 TABLET BY MOUTH EVERY DAY AS NEEDED FOR ERECTILE DYSFUNCTION 6 tablet 2   No current facility-administered medications on file prior to visit.  No Known Allergies  Family History  Problem Relation Age of Onset  . Arthritis Other   . Hyperlipidemia Other   . Heart disease Other   . Hypertension Other   . Diabetes Other     BP 108/68 (BP Location: Right Arm, Patient Position: Sitting, Cuff Size: Normal)   Pulse (!) 104   Ht 5' 8.5" (1.74 m)   Wt 147 lb 3.2 oz (66.8 kg)   SpO2 96%   BMI 22.06 kg/m    Review of Systems     Objective:   Physical Exam VITAL SIGNS:  See vs page GENERAL: no distress Pulses: dorsalis pedis intact bilat.   MSK: no deformity of the feet CV: no leg edema Skin:  no ulcer on the feet.  normal color and temp on the feet. Neuro: sensation is intact to touch on the feet  A1c=6.9%     Assessment & Plan:  Weight loss, ongoing.  Decrease  Trulicity. Type 2 DM: we discussed.  He declines to increase repaglinide for now.    Patient Instructions  I have sent a prescription to your pharmacy, to decrease the Trulicity.   Please continue the same other medications.   check your blood sugar twice a day.  vary the time of day when you check, between before the 3 meals, and at bedtime.  also check if you have symptoms of your blood sugar being too high or too low.  please keep a record of the readings and bring it to your next appointment here (or you can bring the meter itself).  You can write it on any piece of paper.  please call us sooner if your blood sugar goes below 70, or if you have a lot of readings over 200.  Please come back for a follow-up appointment in 3 months.

## 2021-03-24 NOTE — Patient Instructions (Addendum)
I have sent a prescription to your pharmacy, to decrease the Trulicity.   Please continue the same other medications.   check your blood sugar twice a day.  vary the time of day when you check, between before the 3 meals, and at bedtime.  also check if you have symptoms of your blood sugar being too high or too low.  please keep a record of the readings and bring it to your next appointment here (or you can bring the meter itself).  You can write it on any piece of paper.  please call us sooner if your blood sugar goes below 70, or if you have a lot of readings over 200.  Please come back for a follow-up appointment in 3 months.

## 2021-05-19 ENCOUNTER — Other Ambulatory Visit: Payer: Self-pay | Admitting: Endocrinology

## 2021-06-30 ENCOUNTER — Ambulatory Visit: Payer: BC Managed Care – PPO | Admitting: Endocrinology

## 2021-06-30 ENCOUNTER — Other Ambulatory Visit: Payer: Self-pay

## 2021-06-30 VITALS — BP 120/50 | HR 91 | Ht 68.5 in | Wt 157.2 lb

## 2021-06-30 DIAGNOSIS — Z794 Long term (current) use of insulin: Secondary | ICD-10-CM | POA: Diagnosis not present

## 2021-06-30 DIAGNOSIS — E119 Type 2 diabetes mellitus without complications: Secondary | ICD-10-CM

## 2021-06-30 LAB — POCT GLYCOSYLATED HEMOGLOBIN (HGB A1C): Hemoglobin A1C: 7.4 % — AB (ref 4.0–5.6)

## 2021-06-30 MED ORDER — REPAGLINIDE 1 MG PO TABS: 1.0000 mg | ORAL_TABLET | Freq: Two times a day (BID) | ORAL | 3 refills | Status: DC

## 2021-06-30 NOTE — Patient Instructions (Addendum)
I have sent a prescription to your pharmacy, to increase the repaglinide, and:   Please continue the same other medications.   check your blood sugar twice a day.  vary the time of day when you check, between before the 3 meals, and at bedtime.  also check if you have symptoms of your blood sugar being too high or too low.  please keep a record of the readings and bring it to your next appointment here (or you can bring the meter itself).  You can write it on any piece of paper.  please call us sooner if your blood sugar goes below 70, or if you have a lot of readings over 200.  Please come back for a follow-up appointment in 3 months.

## 2021-06-30 NOTE — Progress Notes (Signed)
Subjective:    Patient ID: Wayne Diaz, male    DOB: 12/21/1960, 60 y.o.   MRN: [id number]  HPI Pt returns for f/u of diabetes mellitus: DM type: 2 Dx'ed: 2002 Complications: none Therapy: Trulicity and 4 oral meds.  DKA: never.  Severe hypoglycemia: never.  Pancreatitis: never.   Other: he declines multiple daily injections; he took insulin 2010-2021.   Interval history: pt says cbg varies from 120-223.  He says he never misses meds.  Past Medical History:  Diagnosis Date   ALLERGIC RHINITIS 05/05/2008   ASTHMA 05/05/2008   DIABETES MELLITUS, TYPE II 04/28/2008   GERD 05/05/2008   HYPERLIPIDEMIA 05/05/2008   HYPERSOMNIA 03/10/2009   SMOKER 03/10/2009    Past Surgical History:  Procedure Laterality Date   s/p left knee surgury     ACL repair   TONSILLECTOMY      Social History   Socioeconomic History   Marital status: Married    Spouse name: Not on file   Number of children: 4   Years of education: 16   Highest education level: Not on file  Occupational History   Occupation: Photographer business  Tobacco Use   Smoking status: Every Day    Packs/day: 1.00    Years: 30.00    Pack years: 30.00    Types: Cigarettes   Smokeless tobacco: Never  Substance and Sexual Activity   Alcohol use: No   Drug use: No   Sexual activity: Not on file  Other Topics Concern   Not on file  Social History Narrative   Born and raised in [place], [place]. Raised [place]. Currently resides in house with his wife and daughter. Fun: Yard work, baseball.   Denies religious beliefs effecting health care.    Social Determinants of Health   Financial Resource Strain: Not on file  Food Insecurity: Not on file  Transportation Needs: Not on file  Physical Activity: Not on file  Stress: Not on file  Social Connections: Not on file  Intimate Partner Violence: Not on file    Current Outpatient Medications on File Prior to Visit  Medication Sig Dispense Refill   atorvastatin (LIPITOR) 40  MG tablet Take 1 tablet (40 mg total) by mouth daily. 90 tablet 3   Dulaglutide (TRULICITY) 1.5 MG/0.5ML SOPN Inject 1.5 mg into the skin once a week. 6 mL 3   FARXIGA 5 MG TABS tablet TAKE 1 TABLET (5 MG TOTAL) BY MOUTH DAILY BEFORE BREAKFAST. 90 tablet 3   glucose blood (ONE TOUCH ULTRA TEST) test strip 1 each by Other route 2 (two) times daily. And lancets 2/day. 180 each 3   Lancet Devices (ONE TOUCH DELICA LANCING DEV) MISC Used to check blood sugars. 1 each 0   metFORMIN (GLUCOPHAGE-XR) 500 MG 24 hr tablet Take 4 tablets (2,000 mg total) by mouth daily. 360 tablet 3   pioglitazone (ACTOS) 45 MG tablet Take 1 tablet (45 mg total) by mouth daily. 90 tablet 3   vardenafil (LEVITRA) 20 MG tablet TAKE 1 TABLET BY MOUTH EVERY DAY AS NEEDED FOR ERECTILE DYSFUNCTION 6 tablet 2   No current facility-administered medications on file prior to visit.    No Known Allergies  Family History  Problem Relation Age of Onset   Arthritis Other    Hyperlipidemia Other    Heart disease Other    Hypertension Other    Diabetes Other     BP (!) 120/50 (BP Location: Right Arm, Patient Position: Sitting, Cuff  Size: Normal)   Pulse 91   Ht 5' 8.5" (1.74 m)   Wt 157 lb 3.2 oz (71.3 kg)   SpO2 96%   BMI 23.55 kg/m    Review of Systems He has regained some weight    Objective:   Physical Exam Pulses: dorsalis pedis intact bilat.   MSK: no deformity of the feet CV: no leg edema Skin:  no ulcer on the feet.  normal color and temp on the feet. Neuro: sensation is intact to touch on the feet Ext: there is bilateral onychomycosis of the toenails.    Lab Results  Component Value Date   HGBA1C 7.4 (A) 06/30/2021      Assessment & Plan:  Type 2 DM: uncontrolled  Patient Instructions  I have sent a prescription to your pharmacy, to increase the repaglinide, and:   Please continue the same other medications.   check your blood sugar twice a day.  vary the time of day when you check, between  before the 3 meals, and at bedtime.  also check if you have symptoms of your blood sugar being too high or too low.  please keep a record of the readings and bring it to your next appointment here (or you can bring the meter itself).  You can write it on any piece of paper.  please call us sooner if your blood sugar goes below 70, or if you have a lot of readings over 200.  Please come back for a follow-up appointment in 3 months.

## 2021-08-04 ENCOUNTER — Ambulatory Visit: Payer: BC Managed Care – PPO | Admitting: Podiatry

## 2021-08-23 ENCOUNTER — Other Ambulatory Visit: Payer: Self-pay | Admitting: Endocrinology

## 2021-08-23 DIAGNOSIS — N529 Male erectile dysfunction, unspecified: Secondary | ICD-10-CM

## 2021-09-22 ENCOUNTER — Ambulatory Visit: Payer: BC Managed Care – PPO | Admitting: Endocrinology

## 2021-09-22 ENCOUNTER — Other Ambulatory Visit: Payer: Self-pay

## 2021-09-22 VITALS — BP 120/70 | HR 105 | Ht 68.5 in | Wt 167.4 lb

## 2021-09-22 DIAGNOSIS — E119 Type 2 diabetes mellitus without complications: Secondary | ICD-10-CM | POA: Diagnosis not present

## 2021-09-22 DIAGNOSIS — Z794 Long term (current) use of insulin: Secondary | ICD-10-CM | POA: Diagnosis not present

## 2021-09-22 LAB — POCT GLYCOSYLATED HEMOGLOBIN (HGB A1C): Hemoglobin A1C: 7.6 % — AB (ref 4.0–5.6)

## 2021-09-22 MED ORDER — REPAGLINIDE 2 MG PO TABS: 2.0000 mg | ORAL_TABLET | Freq: Two times a day (BID) | ORAL | 3 refills | Status: DC

## 2021-09-22 NOTE — Progress Notes (Signed)
Subjective:    Patient ID: Wayne Diaz, male    DOB: 18-May-1961, 60 y.o.   MRN: [id number]  HPI Pt returns for f/u of diabetes mellitus: DM type: 2 Dx'ed: 2002 Complications: none Therapy: Trulicity and 4 oral meds.  DKA: never.  Severe hypoglycemia: never.  Pancreatitis: never.   Other: he declined multiple daily injections; he took insulin 2010-2021; trulicity dosage is limited by weight loss.   Interval history: pt says cbg varies from 134-190.  He says he never misses meds. He has regained a few lbs.   Past Medical History:  Diagnosis Date   ALLERGIC RHINITIS 05/05/2008   ASTHMA 05/05/2008   DIABETES MELLITUS, TYPE II 04/28/2008   GERD 05/05/2008   HYPERLIPIDEMIA 05/05/2008   HYPERSOMNIA 03/10/2009   SMOKER 03/10/2009    Past Surgical History:  Procedure Laterality Date   s/p left knee surgury     ACL repair   TONSILLECTOMY      Social History   Socioeconomic History   Marital status: Married    Spouse name: Not on file   Number of children: 4   Years of education: 16   Highest education level: Not on file  Occupational History   Occupation: Photographer business  Tobacco Use   Smoking status: Every Day    Packs/day: 1.00    Years: 30.00    Pack years: 30.00    Types: Cigarettes   Smokeless tobacco: Never  Substance and Sexual Activity   Alcohol use: No   Drug use: No   Sexual activity: Not on file  Other Topics Concern   Not on file  Social History Narrative   Born and raised in [place], [place]. Raised [place]. Currently resides in house with his wife and daughter. Fun: Yard work, baseball.   Denies religious beliefs effecting health care.    Social Determinants of Health   Financial Resource Strain: Not on file  Food Insecurity: Not on file  Transportation Needs: Not on file  Physical Activity: Not on file  Stress: Not on file  Social Connections: Not on file  Intimate Partner Violence: Not on file    Current Outpatient Medications on File Prior  to Visit  Medication Sig Dispense Refill   atorvastatin (LIPITOR) 40 MG tablet Take 1 tablet (40 mg total) by mouth daily. 90 tablet 3   Dulaglutide (TRULICITY) 1.5 MG/0.5ML SOPN Inject 1.5 mg into the skin once a week. 6 mL 3   FARXIGA 5 MG TABS tablet TAKE 1 TABLET (5 MG TOTAL) BY MOUTH DAILY BEFORE BREAKFAST. 90 tablet 3   glucose blood (ONE TOUCH ULTRA TEST) test strip 1 each by Other route 2 (two) times daily. And lancets 2/day. 180 each 3   Lancet Devices (ONE TOUCH DELICA LANCING DEV) MISC Used to check blood sugars. 1 each 0   metFORMIN (GLUCOPHAGE-XR) 500 MG 24 hr tablet Take 4 tablets (2,000 mg total) by mouth daily. 360 tablet 3   pioglitazone (ACTOS) 45 MG tablet Take 1 tablet (45 mg total) by mouth daily. 90 tablet 3   vardenafil (LEVITRA) 20 MG tablet TAKE 1 TABLET BY MOUTH EVERY DAY AS NEEDED FOR ERECTILE DYSFUNCTION 6 tablet 2   No current facility-administered medications on file prior to visit.    No Known Allergies  Family History  Problem Relation Age of Onset   Arthritis Other    Hyperlipidemia Other    Heart disease Other    Hypertension Other    Diabetes Other  BP 120/70 (BP Location: Right Arm, Patient Position: Sitting, Cuff Size: Normal)   Pulse (!) 105   Ht 5' 8.5" (1.74 m)   Wt 167 lb 6.4 oz (75.9 kg)   SpO2 95%   BMI 25.08 kg/m    Review of Systems     Objective:   Physical Exam Pulses: dorsalis pedis intact bilat.   MSK: no deformity of the feet CV: no leg edema Skin:  no ulcer on the feet.  normal color and temp on the feet. Neuro: sensation is intact to touch on the feet   Lab Results  Component Value Date   HGBA1C 7.6 (A) 09/22/2021      Assessment & Plan:  Type 2 DM: uncontrolled.   Patient Instructions  I have sent a prescription to your pharmacy, to increase the repaglinide, and:   Please continue the same other medications.   check your blood sugar twice a day.  vary the time of day when you check, between before the 3  meals, and at bedtime.  also check if you have symptoms of your blood sugar being too high or too low.  please keep a record of the readings and bring it to your next appointment here (or you can bring the meter itself).  You can write it on any piece of paper.  please call us sooner if your blood sugar goes below 70, or if you have a lot of readings over 200.  Please come back for a follow-up appointment in 3 months.

## 2021-09-22 NOTE — Patient Instructions (Signed)
I have sent a prescription to your pharmacy, to increase the repaglinide, and:   Please continue the same other medications.   check your blood sugar twice a day.  vary the time of day when you check, between before the 3 meals, and at bedtime.  also check if you have symptoms of your blood sugar being too high or too low.  please keep a record of the readings and bring it to your next appointment here (or you can bring the meter itself).  You can write it on any piece of paper.  please call us sooner if your blood sugar goes below 70, or if you have a lot of readings over 200.  Please come back for a follow-up appointment in 3 months.

## 2021-10-04 DIAGNOSIS — R519 Headache, unspecified: Secondary | ICD-10-CM | POA: Diagnosis not present

## 2021-10-04 DIAGNOSIS — R0981 Nasal congestion: Secondary | ICD-10-CM | POA: Diagnosis not present

## 2021-10-04 DIAGNOSIS — J0100 Acute maxillary sinusitis, unspecified: Secondary | ICD-10-CM | POA: Diagnosis not present

## 2021-10-04 DIAGNOSIS — R0982 Postnasal drip: Secondary | ICD-10-CM | POA: Diagnosis not present

## 2021-10-10 ENCOUNTER — Other Ambulatory Visit: Payer: Self-pay | Admitting: Family Medicine

## 2021-10-19 ENCOUNTER — Other Ambulatory Visit: Payer: Self-pay | Admitting: Endocrinology

## 2021-12-22 ENCOUNTER — Ambulatory Visit: Payer: BC Managed Care – PPO | Admitting: Endocrinology

## 2021-12-22 ENCOUNTER — Other Ambulatory Visit: Payer: Self-pay

## 2021-12-22 VITALS — BP 108/62 | HR 114 | Ht 68.5 in | Wt 169.4 lb

## 2021-12-22 DIAGNOSIS — E119 Type 2 diabetes mellitus without complications: Secondary | ICD-10-CM

## 2021-12-22 DIAGNOSIS — Z794 Long term (current) use of insulin: Secondary | ICD-10-CM

## 2021-12-22 LAB — POCT GLYCOSYLATED HEMOGLOBIN (HGB A1C): Hemoglobin A1C: 7.5 % — AB (ref 4.0–5.6)

## 2021-12-22 MED ORDER — TRULICITY 3 MG/0.5ML ~~LOC~~ SOAJ: 3.0000 mg | SUBCUTANEOUS | 3 refills | Status: DC

## 2021-12-22 MED ORDER — PIOGLITAZONE HCL 30 MG PO TABS: 30.0000 mg | ORAL_TABLET | Freq: Every day | ORAL | 3 refills | Status: DC

## 2021-12-22 NOTE — Patient Instructions (Addendum)
I have sent a prescription to your pharmacy, to increase the Trulicity, and decrease the pioglitazone, and:    Please continue the same other medications.   check your blood sugar twice a day.  vary the time of day when you check, between before the 3 meals, and at bedtime.  also check if you have symptoms of your blood sugar being too high or too low.  please keep a record of the readings and bring it to your next appointment here (or you can bring the meter itself).  You can write it on any piece of paper.  please call us sooner if your blood sugar goes below 70, or if you have a lot of readings over 200.   Please come back for a follow-up appointment in 2-3 months.

## 2021-12-22 NOTE — Progress Notes (Signed)
Subjective:    Patient ID: Wayne Diaz, male    DOB: May 24, 1961, 61 y.o.   MRN: [id number]  HPI Pt returns for f/u of diabetes mellitus: DM type: 2 Dx'ed: [id number] Complications: none Therapy: Trulicity and 4 oral meds.  DKA: never.  Severe hypoglycemia: never.  Pancreatitis: never.   Other: he declined multiple daily injections; he took insulin [id number]; trulicity dosage is limited by weight loss.   Interval history: pt says cbg varies from 170-200.  He says he never misses meds. He has regained 18 lbs, since off Trulicity.   Past Medical History:  Diagnosis Date   ALLERGIC RHINITIS 05/05/2008   ASTHMA 05/05/2008   DIABETES MELLITUS, TYPE II 04/28/2008   GERD 05/05/2008   HYPERLIPIDEMIA 05/05/2008   HYPERSOMNIA 03/10/2009   SMOKER 03/10/2009    Past Surgical History:  Procedure Laterality Date   s/p left knee surgury     ACL repair   TONSILLECTOMY      Social History   Socioeconomic History   Marital status: Married    Spouse name: Not on file   Number of children: 4   Years of education: 16   Highest education level: Not on file  Occupational History   Occupation: Science writer business  Tobacco Use   Smoking status: Every Day    Packs/day: 1.00    Years: 30.00    Pack years: 30.00    Types: Cigarettes   Smokeless tobacco: Never  Substance and Sexual Activity   Alcohol use: No   Drug use: No   Sexual activity: Not on file  Other Topics Concern   Not on file  Social History Narrative   Born and raised in [place], [place]. Raised [place] and [place]. Currently resides in house with his wife and daughter. Fun: Yard work, baseball.   Denies religious beliefs effecting health care.    Social Determinants of Health   Financial Resource Strain: Not on file  Food Insecurity: Not on file  Transportation Needs: Not on file  Physical Activity: Not on file  Stress: Not on file  Social Connections: Not on file  Intimate Partner Violence: Not on file    Current Outpatient  Medications on File Prior to Visit  Medication Sig Dispense Refill   atorvastatin (LIPITOR) 40 MG tablet TAKE 1 TABLET BY MOUTH EVERY DAY 90 tablet 3   FARXIGA 5 MG TABS tablet TAKE 1 TABLET (5 MG TOTAL) BY MOUTH DAILY BEFORE BREAKFAST. 90 tablet 3   glucose blood (ONE TOUCH ULTRA TEST) test strip 1 each by Other route 2 (two) times daily. And lancets 2/day. 180 each 3   Lancet Devices (ONE TOUCH DELICA LANCING DEV) MISC Used to check blood sugars. 1 each 0   metFORMIN (GLUCOPHAGE-XR) 500 MG 24 hr tablet Take 4 tablets (2,000 mg total) by mouth daily. 360 tablet 3   repaglinide (PRANDIN) 2 MG tablet Take 1 tablet (2 mg total) by mouth 2 (two) times daily before a meal. 180 tablet 3   vardenafil (LEVITRA) 20 MG tablet TAKE 1 TABLET BY MOUTH EVERY DAY AS NEEDED FOR ERECTILE DYSFUNCTION 6 tablet 2   No current facility-administered medications on file prior to visit.    No Known Allergies  Family History  Problem Relation Age of Onset   Arthritis Other    Hyperlipidemia Other    Heart disease Other    Hypertension Other    Diabetes Other     BP 108/62    Pulse (!) 114  Ht 5' 8.5" (1.74 m)    Wt 169 lb 6.4 oz (76.8 kg)    SpO2 97%    BMI 25.38 kg/m    Review of Systems He denies hypoglycemia/N/V/HB/bloating.      Objective:   Physical Exam VITAL SIGNS:  See vs page GENERAL: no distress Pulses: dorsalis pedis intact bilat.   MSK: no deformity of the feet CV: 1+ bilat leg edema Skin:  no ulcer on the feet.  normal color and temp on the feet. Neuro: sensation is intact to touch on the feet  A1c=7.5%  Lab Results  Component Value Date   CREATININE 0.76 01/31/2021   BUN 21 01/31/2021   NA 137 01/31/2021   K 4.1 01/31/2021   CL 99 01/31/2021   CO2 30 01/31/2021      Assessment & Plan:  Type 2 DM: uncontrolled Edema, due to pioglitazone  Patient Instructions  I have sent a prescription to your pharmacy, to increase the Trulicity, and decrease the pioglitazone, and:     Please continue the same other medications.   check your blood sugar twice a day.  vary the time of day when you check, between before the 3 meals, and at bedtime.  also check if you have symptoms of your blood sugar being too high or too low.  please keep a record of the readings and bring it to your next appointment here (or you can bring the meter itself).  You can write it on any piece of paper.  please call us sooner if your blood sugar goes below 70, or if you have a lot of readings over 200.   Please come back for a follow-up appointment in 2-3 months.

## 2021-12-27 ENCOUNTER — Telehealth: Payer: Self-pay

## 2021-12-27 ENCOUNTER — Other Ambulatory Visit: Payer: Self-pay | Admitting: Endocrinology

## 2021-12-27 ENCOUNTER — Other Ambulatory Visit (HOSPITAL_COMMUNITY): Payer: Self-pay

## 2021-12-27 MED ORDER — TRULICITY 1.5 MG/0.5ML ~~LOC~~ SOAJ: 3.0000 mg | SUBCUTANEOUS | 3 refills | Status: DC

## 2021-12-27 NOTE — Telephone Encounter (Signed)
Due to product availability and medication potentially being discontinued, would you like to switch to different medication or proceed with PA and pt will have find pharmacy that has medication in stock?

## 2021-12-27 NOTE — Telephone Encounter (Signed)
Pt LVM stated that the trulicity is on back order and that the pharmacy suggested to eith change medication or send a script for the 1.5mg  and the directions would be to inject 2mg .  Please Advise

## 2021-12-27 NOTE — Telephone Encounter (Signed)
Message sent thru MyChart 

## 2021-12-29 ENCOUNTER — Telehealth: Payer: Self-pay

## 2021-12-29 ENCOUNTER — Encounter: Payer: Self-pay | Admitting: Family Medicine

## 2021-12-29 NOTE — Telephone Encounter (Signed)
Can someone F/u with this to see if a PA is needed. Pt stated that insurance wants approval to inject 2 of the 1.5mg  of the Truliciy bc they do not have the 3mg .  Thank you,  

## 2021-12-31 ENCOUNTER — Other Ambulatory Visit: Payer: Self-pay | Admitting: Endocrinology

## 2021-12-31 MED ORDER — OZEMPIC (0.25 OR 0.5 MG/DOSE) 2 MG/1.5ML ~~LOC~~ SOPN: 2.0000 mg | PEN_INJECTOR | SUBCUTANEOUS | 3 refills | Status: DC

## 2022-01-01 ENCOUNTER — Other Ambulatory Visit (HOSPITAL_COMMUNITY): Payer: Self-pay

## 2022-01-02 ENCOUNTER — Telehealth: Payer: Self-pay

## 2022-01-02 NOTE — Telephone Encounter (Signed)
PT LVM stating that the insurance company stated that the Trulicity is only available in the 1.5mg  and since you changed his dosage to the 3mg  the order needs to be change to reflect him injecting 2 of the 1.5mg  to equal up to the 3mg . Over the weekend his BS went up to 260 so he stated that he had an old Lantus pen that he use to help regulate his BS since he did not have Trulicity.   Please Advise

## 2022-01-02 NOTE — Telephone Encounter (Signed)
Attempted to contact the patient regarding issues with Ozempic and Trulicity. Patient's voicemaill box hasnt yet been sent up to leave a voicemail.

## 2022-01-03 NOTE — Telephone Encounter (Signed)
Pt has been made aware. 

## 2022-01-03 NOTE — Telephone Encounter (Signed)
Pt has been made aware and has pick up the medication ?

## 2022-01-29 ENCOUNTER — Other Ambulatory Visit: Payer: Self-pay | Admitting: Endocrinology

## 2022-02-03 ENCOUNTER — Encounter: Payer: Self-pay | Admitting: Endocrinology

## 2022-02-05 ENCOUNTER — Other Ambulatory Visit: Payer: Self-pay | Admitting: Endocrinology

## 2022-02-05 MED ORDER — OZEMPIC (2 MG/DOSE) 8 MG/3ML ~~LOC~~ SOPN: 2.0000 mg | PEN_INJECTOR | SUBCUTANEOUS | 3 refills | Status: DC

## 2022-02-07 ENCOUNTER — Encounter: Payer: Self-pay | Admitting: Family

## 2022-02-07 ENCOUNTER — Ambulatory Visit: Payer: BC Managed Care – PPO | Admitting: Family

## 2022-02-07 VITALS — BP 119/73 | HR 103 | Temp 98.7°F | Ht 69.0 in | Wt 157.0 lb

## 2022-02-07 DIAGNOSIS — K529 Noninfective gastroenteritis and colitis, unspecified: Secondary | ICD-10-CM | POA: Diagnosis not present

## 2022-02-07 MED ORDER — PANTOPRAZOLE SODIUM 20 MG PO TBEC: 20.0000 mg | DELAYED_RELEASE_TABLET | Freq: Every day | ORAL | 0 refills | Status: DC

## 2022-02-07 MED ORDER — ONDANSETRON 4 MG PO TBDP: 4.0000 mg | ORAL_TABLET | Freq: Three times a day (TID) | ORAL | 0 refills | Status: DC | PRN

## 2022-02-07 NOTE — Patient Instructions (Addendum)
It was very nice to see you today! ? ?Check your blood sugar twice a day, if running low, hold your Actos and Metformin until fasting blood sugar above 70. ?Drink whatever fluids you can keep down. ?I am sending Zofran for the nausea and Protonix for reflux that can help your symptoms also the burping and gas you are having.  ?Follow up with Dr. Jimmey Ralph if your symptoms are not resolving and discuss possible anxiety treatment. ? ? ? ?PLEASE NOTE: ? ?If you had any lab tests please let us know if you have not heard back within a few days. You may see your results on MyChart before we have a chance to review them but we will give you a call once they are reviewed by [place]. If we ordered any referrals today, please let us know if you have not heard from their office within the next week.  ? ?Please try these tips to maintain a healthy lifestyle: ? ?Eat most of your calories during the day when you are active. Eliminate processed foods including packaged sweets (pies, cakes, cookies), reduce intake of potatoes, white bread, white pasta, and white rice. Look for whole grain options, oat flour or almond flour. ? ?Each meal should contain half fruits/vegetables, one quarter protein, and one quarter carbs (no bigger than a computer mouse). ? ?Cut down on sweet beverages. This includes juice, soda, and sweet tea. Also watch fruit intake, though this is a healthier sweet option, it still contains natural sugar! Limit to 3 servings daily. ? ?Drink at least 1 glass of water with each meal and aim for at least 8 glasses per day ? ?Exercise at least 150 minutes every week.  ? ?

## 2022-02-07 NOTE — Progress Notes (Signed)
? ?Subjective:  ? ? ? Patient ID: Wayne Diaz, male    DOB: Oct 20, 1961, 61 y.o.   MRN: [id number] ? ?Chief Complaint  ?Patient presents with  ? Nausea  ?  Pt c/o nausea and vomiting for the 2 mornings. Has tried Pepto bismol yesterday and it helped his stomach but felt unsettled.   ? Diarrhea  ? ?HPI: ?Gastroenteritis: reports started last week, isolated episodes, but more consistent last 2 days several episodes, kept potato and milk down. Diarrhea is mostly loose stools and has had chronically for over a year. Denies any recent changes in meds.Wife also present is not sick. She thinks symptoms related to increased stress lately.  ? ?Health Maintenance Due  ?Topic Date Due  ? OPHTHALMOLOGY EXAM  Never done  ? HIV Screening  Never done  ? Hepatitis C Screening  Never done  ? Zoster Vaccines- Shingrix (1 of 2) Never done  ? URINE MICROALBUMIN  04/04/2017  ? COVID-19 Vaccine (3 - Booster for Pfizer series) 04/30/2020  ? COLONOSCOPY (Pts 45-62yrs Insurance coverage will need to be confirmed)  05/12/2020  ? ? ?Past Medical History:  ?Diagnosis Date  ? ALLERGIC RHINITIS 05/05/2008  ? ASTHMA 05/05/2008  ? DIABETES MELLITUS, TYPE II 04/28/2008  ? GERD 05/05/2008  ? HYPERLIPIDEMIA 05/05/2008  ? HYPERSOMNIA 03/10/2009  ? SMOKER 03/10/2009  ? ? ?Past Surgical History:  ?Procedure Laterality Date  ? s/p left knee surgury    ? ACL repair  ? TONSILLECTOMY    ? ? ?Outpatient Medications Prior to Visit  ?Medication Sig Dispense Refill  ? atorvastatin (LIPITOR) 40 MG tablet TAKE 1 TABLET BY MOUTH EVERY DAY 90 tablet 3  ? FARXIGA 5 MG TABS tablet TAKE 1 TABLET (5 MG TOTAL) BY MOUTH DAILY BEFORE BREAKFAST. 90 tablet 3  ? Lancet Devices (ONE TOUCH DELICA LANCING DEV) MISC Used to check blood sugars. 1 each 0  ? metFORMIN (GLUCOPHAGE-XR) 500 MG 24 hr tablet Take 4 tablets (2,000 mg total) by mouth daily. 360 tablet 3  ? pioglitazone (ACTOS) 30 MG tablet Take 1 tablet (30 mg total) by mouth daily. 90 tablet 3  ? repaglinide (PRANDIN) 2 MG tablet Take  1 tablet (2 mg total) by mouth 2 (two) times daily before a meal. 180 tablet 3  ? Semaglutide, 1 MG/DOSE, (OZEMPIC, 1 MG/DOSE,) 2 MG/1.5ML SOPN Inject into the skin.    ? vardenafil (LEVITRA) 20 MG tablet TAKE 1 TABLET BY MOUTH EVERY DAY AS NEEDED FOR ERECTILE DYSFUNCTION 6 tablet 2  ? Semaglutide, 2 MG/DOSE, (OZEMPIC, 2 MG/DOSE,) 8 MG/3ML SOPN Inject 2 mg into the skin once a week. 9 mL 3  ? TRULICITY 1.5 MG/0.5ML SOPN Inject 1.5 mg into the skin once a week.    ? glucose blood (ONE TOUCH ULTRA TEST) test strip 1 each by Other route 2 (two) times daily. And lancets 2/day. 180 each 3  ? ?No facility-administered medications prior to visit.  ? ? ?No Known Allergies ?   ?Objective:  ?  ?Physical Exam ?Vitals and nursing note reviewed.  ?Constitutional:   ?   General: He is not in acute distress. ?   Appearance: Normal appearance.  ?HENT:  ?   Head: Normocephalic.  ?Cardiovascular:  ?   Rate and Rhythm: Normal rate and regular rhythm.  ?Pulmonary:  ?   Effort: Pulmonary effort is normal.  ?   Breath sounds: Normal breath sounds.  ?Musculoskeletal:     ?   General: Normal range of motion.  ?  Cervical back: Normal range of motion.  ?Skin: ?   General: Skin is warm and dry.  ?Neurological:  ?   Mental Status: He is alert and oriented to person, place, and time.  ?Psychiatric:     ?   Mood and Affect: Mood normal.  ? ? ?BP 119/73 (BP Location: Left Arm, Patient Position: Sitting, Cuff Size: Large)   Pulse (!) 103   Temp 98.7 ?F (37.1 ?C) (Temporal)   Ht 5\' 9"  (1.753 m)   Wt 157 lb (71.2 kg)   SpO2 94%   BMI 23.18 kg/m?  ?Wt Readings from Last 3 Encounters:  ?02/07/22 157 lb (71.2 kg)  ?12/22/21 169 lb 6.4 oz (76.8 kg)  ?09/22/21 167 lb 6.4 oz (75.9 kg)  ? ?   ?Assessment & Plan:  ? ?Problem List Items Addressed This Visit   ?None ?Visit Diagnoses   ? ? Gastroenteritis    -  Primary  ? Sending Zofran and restarting Protonix, pt reports taking a PPI in past, advised on use & SE. Advised on increasing water intake to  2 liters qd and BRAT diet until nausea resolves. If sx don't resolve may need to discuss anxiety further with PCP. ? ?Relevant Medications  ? pantoprazole (PROTONIX) 20 MG tablet  ? ondansetron (ZOFRAN-ODT) 4 MG disintegrating tablet  ? ?  ? ?Meds ordered this encounter  ?Medications  ? pantoprazole (PROTONIX) 20 MG tablet  ?  Sig: Take 1 tablet (20 mg total) by mouth daily. Take 1 pill twice a day for 2 weeks, then 1 pill every morning.  ?  Dispense:  30 tablet  ?  Refill:  0  ?  Order Specific Question:   Supervising Provider  ?  Answer:   ANDY, CAMILLE L [[id number]]  ? ondansetron (ZOFRAN-ODT) 4 MG disintegrating tablet  ?  Sig: Take 1 tablet (4 mg total) by mouth every 8 (eight) hours as needed for nausea or vomiting. SUBLINGUAL  ?  Dispense:  20 tablet  ?  Refill:  0  ?  Order Specific Question:   Supervising Provider  ?  Answer:   ANDY, CAMILLE L [[id number]]  ? ? ?09/24/21, NP ? ?

## 2022-02-15 ENCOUNTER — Other Ambulatory Visit: Payer: Self-pay | Admitting: Endocrinology

## 2022-03-01 ENCOUNTER — Other Ambulatory Visit: Payer: Self-pay | Admitting: Family

## 2022-03-01 DIAGNOSIS — K529 Noninfective gastroenteritis and colitis, unspecified: Secondary | ICD-10-CM

## 2022-03-02 ENCOUNTER — Ambulatory Visit: Payer: BC Managed Care – PPO | Admitting: Physician Assistant

## 2022-03-02 ENCOUNTER — Encounter: Payer: Self-pay | Admitting: Physician Assistant

## 2022-03-02 VITALS — BP 130/72 | HR 104 | Temp 98.5°F | Ht 69.0 in | Wt 157.0 lb

## 2022-03-02 DIAGNOSIS — Z1211 Encounter for screening for malignant neoplasm of colon: Secondary | ICD-10-CM | POA: Diagnosis not present

## 2022-03-02 DIAGNOSIS — R112 Nausea with vomiting, unspecified: Secondary | ICD-10-CM | POA: Diagnosis not present

## 2022-03-02 DIAGNOSIS — F17210 Nicotine dependence, cigarettes, uncomplicated: Secondary | ICD-10-CM | POA: Diagnosis not present

## 2022-03-02 MED ORDER — ONDANSETRON 4 MG PO TBDP: 4.0000 mg | ORAL_TABLET | Freq: Three times a day (TID) | ORAL | 0 refills | Status: DC | PRN

## 2022-03-02 MED ORDER — PANTOPRAZOLE SODIUM 20 MG PO TBEC: DELAYED_RELEASE_TABLET | ORAL | 0 refills | Status: DC

## 2022-03-02 NOTE — Progress Notes (Signed)
Wayne Diaz is a 61 y.o. male here for emesis. ? ?History of Present Illness:  ? ?Chief Complaint  ?Patient presents with  ? Emesis  ?  Pt c/o nausea and vomiting for about 3-4 weeks. Usually in the morning time.   ? ?Emesis ?Pt was initially seen on 02/07/22 by Dulce Sellar about this issue. At that time he stated sx started the week before and although his emesis initially started as isolated episodes but 2 days prior to the visit it became more consistent. In addition to this, he also admitted that he did have a hx of chronic diarrhea for about a year containing mostly loose stools, but this has since resolved. Following this visit, pt was believed to have gastroenteritis and was prescribed protonix 20 mg twice daily x 2 weeks then 1 tablet every morning. He was also prescribed zofran 4 mg every 8 hours as needed for nausea.  ? ?At this time he returns due to continuation of vomiting bile every morning. He states that when he was taking the protonix and zofran, it did help him to not vomit but didn't help with his nausea. Despite this he was interested in continuing the medication but unfortunately it ran out.  ? ?Pt states he did experience only one day, 02/26/22, where he vomited later in the day which occurred after he ate a piece of pizza.  Pt did recently switch from using trulicity 1.5 mg weekly to using ozempic 2 mg weekly due to medication shortages. While this change is recent, he doesn't believe it to be the cause of his sx due to this occurring before the switch. Denies fever, chills, hematemesis, or abdominal pain.   ? ?Has hx of significant weight loss that was being monitored by Dr. Everardo All and thought to be due to use of trulicity. He is inquiring about cancer screenings today. ? ?Colon cancer screening ?He does not want any further colonoscopies due to negative long-term side effects from prior colonsocopy (anal leakage and urgency.) ? ?Smoking ?Would like CT lung cancer screening; has  never had this.  ? ?Past Medical History:  ?Diagnosis Date  ? ALLERGIC RHINITIS 05/05/2008  ? ASTHMA 05/05/2008  ? DIABETES MELLITUS, TYPE II 04/28/2008  ? GERD 05/05/2008  ? HYPERLIPIDEMIA 05/05/2008  ? HYPERSOMNIA 03/10/2009  ? SMOKER 03/10/2009  ? ?  ?Social History  ? ?Tobacco Use  ? Smoking status: Every Day  ?  Packs/day: 1.00  ?  Years: 30.00  ?  Pack years: 30.00  ?  Types: Cigarettes  ? Smokeless tobacco: Never  ?Substance Use Topics  ? Alcohol use: No  ? Drug use: No  ? ? ?Past Surgical History:  ?Procedure Laterality Date  ? s/p left knee surgury    ? ACL repair  ? TONSILLECTOMY    ? ? ?Family History  ?Problem Relation Age of Onset  ? Arthritis Other   ? Hyperlipidemia Other   ? Heart disease Other   ? Hypertension Other   ? Diabetes Other   ? ? ?No Known Allergies ? ?Current Medications:  ? ?Current Outpatient Medications:  ?  atorvastatin (LIPITOR) 40 MG tablet, TAKE 1 TABLET BY MOUTH EVERY DAY, Disp: 90 tablet, Rfl: 3 ?  FARXIGA 5 MG TABS tablet, TAKE 1 TABLET (5 MG TOTAL) BY MOUTH DAILY BEFORE BREAKFAST., Disp: 90 tablet, Rfl: 3 ?  glucose-Vitamin C 4-0.006 GM CHEW chewable tablet, Chew 1 tablet by mouth., Disp: , Rfl:  ?  Lancet Devices (ONE [facility]  LANCING DEV) MISC, Used to check blood sugars., Disp: 1 each, Rfl: 0 ?  metFORMIN (GLUCOPHAGE-XR) 500 MG 24 hr tablet, TAKE 4 TABLETS (2,000 MG TOTAL) BY MOUTH DAILY., Disp: 360 tablet, Rfl: 3 ?  pioglitazone (ACTOS) 30 MG tablet, Take 1 tablet (30 mg total) by mouth daily., Disp: 90 tablet, Rfl: 3 ?  repaglinide (PRANDIN) 2 MG tablet, Take 1 tablet (2 mg total) by mouth 2 (two) times daily before a meal., Disp: 180 tablet, Rfl: 3 ?  Semaglutide, 1 MG/DOSE, (OZEMPIC, 1 MG/DOSE,) 2 MG/1.5ML SOPN, Inject into the skin., Disp: , Rfl:  ?  vardenafil (LEVITRA) 20 MG tablet, TAKE 1 TABLET BY MOUTH EVERY DAY AS NEEDED FOR ERECTILE DYSFUNCTION, Disp: 6 tablet, Rfl: 2 ?  ondansetron (ZOFRAN-ODT) 4 MG disintegrating tablet, Take 1 tablet (4 mg total) by mouth every 8  (eight) hours as needed for nausea or vomiting. SUBLINGUAL (Patient not taking: Reported on 03/02/2022), Disp: 20 tablet, Rfl: 0 ?  pantoprazole (PROTONIX) 20 MG tablet, TAKE 1 TABELT TWICE DAILY FOR 2 WEEKS THEN 1 TABLET EVERY MORNING (Patient not taking: Reported on 03/02/2022), Disp: 30 tablet, Rfl: 0 ?  TRULICITY 1.5 MG/0.5ML SOPN, Inject 1.5 mg into the skin once a week. (Patient not taking: Reported on 03/02/2022), Disp: , Rfl:   ? ?Review of Systems:  ? ?Review of Systems  ?Gastrointestinal:  Positive for vomiting.  ? ?Vitals:  ? ?Vitals:  ? 03/02/22 1513  ?BP: 130/72  ?Pulse: (!) 104  ?Temp: 98.5 ?F (36.9 ?C)  ?TempSrc: Temporal  ?SpO2: 96%  ?Weight: 157 lb (71.2 kg)  ?Height: 5\' 9"  (1.753 m)  ?   ?Body mass index is 23.18 kg/m?. ? ?Physical Exam:  ? ?Physical Exam ?Vitals and nursing note reviewed.  ?Constitutional:   ?   General: He is not in acute distress. ?   Appearance: He is well-developed. He is not ill-appearing or toxic-appearing.  ?Cardiovascular:  ?   Rate and Rhythm: Normal rate and regular rhythm.  ?   Pulses: Normal pulses.  ?   Heart sounds: Normal heart sounds, S1 normal and S2 normal.  ?Pulmonary:  ?   Effort: Pulmonary effort is normal.  ?   Breath sounds: Normal breath sounds.  ?Abdominal:  ?   General: Abdomen is flat. Bowel sounds are normal.  ?   Palpations: Abdomen is soft.  ?   Tenderness: There is no abdominal tenderness. There is no right CVA tenderness or left CVA tenderness.  ?Skin: ?   General: Skin is warm and dry.  ?Neurological:  ?   Mental Status: He is alert.  ?   GCS: GCS eye subscore is 4. GCS verbal subscore is 5. GCS motor subscore is 6.  ?Psychiatric:     ?   Speech: Speech normal.     ?   Behavior: Behavior normal. Behavior is cooperative.  ? ? ?Assessment and Plan:  ? ?Nausea and vomiting, unspecified vomiting type ?No red flags ?Restart protonix 20 mg bid ?May take zofran 4 mg every 8 hours as needed for nausea  ?Informed patient to hold off taking ozempic for at least  one week to see if this makes a difference in their symptoms ?If lack of improvements, recommend GI referral ?Follow up if new/worsening symptoms or concerns occur  ? ?Smoking with greater than 30 pack years ?Lung Cancer Screening ordered ? ?Colorectal screening ?Refuses colonoscopy ?Agreeable to cologuard; ordered ? ? ?I,Havlyn C Ratchford,acting as a Neurosurgeonscribe for Energy East CorporationSamantha Kellyjo Edgren, PA.,have documented  all relevant documentation on the behalf of Jarold Motto, PA,as directed by  Jarold Motto, PA while in the presence of Jarold Motto, [place]. ? ?IJarold Motto, PA, have reviewed all documentation for this visit. The documentation on 03/02/22 for the exam, diagnosis, procedures, and orders are all accurate and complete. ? ? ?Jarold Motto, PA-C ? ?

## 2022-03-02 NOTE — Patient Instructions (Addendum)
It was great to see you! ? ?Start daily protonix ?Take as needed zofran ? ?HOLD your next ozempic to see if this makes a difference in your symptoms ? ?We are going to order a cologuard test and lung cancer screening test as well ? ?Take care, ? ?Jarold Motto PA-C  ?

## 2022-03-05 LAB — COMPREHENSIVE METABOLIC PANEL
AG Ratio: 2 (calc) (ref 1.0–2.5)
ALT: 23 U/L (ref 9–46)
AST: 15 U/L (ref 10–35)
Albumin: 4.4 g/dL (ref 3.6–5.1)
Alkaline phosphatase (APISO): 65 U/L (ref 35–144)
BUN: 15 mg/dL (ref 7–25)
CO2: 24 mmol/L (ref 20–32)
Calcium: 9.6 mg/dL (ref 8.6–10.3)
Chloride: 102 mmol/L (ref 98–110)
Creat: 0.7 mg/dL (ref 0.70–1.35)
Globulin: 2.2 g/dL (calc) (ref 1.9–3.7)
Glucose, Bld: 132 mg/dL — ABNORMAL HIGH (ref 65–99)
Potassium: 3.9 mmol/L (ref 3.5–5.3)
Sodium: 138 mmol/L (ref 135–146)
Total Bilirubin: 0.5 mg/dL (ref 0.2–1.2)
Total Protein: 6.6 g/dL (ref 6.1–8.1)

## 2022-03-05 LAB — CBC WITH DIFFERENTIAL/PLATELET
Absolute Monocytes: 482 cells/uL (ref 200–950)
Basophils Absolute: 36 cells/uL (ref 0–200)
Basophils Relative: 0.4 %
Eosinophils Absolute: 100 cells/uL (ref 15–500)
Eosinophils Relative: 1.1 %
HCT: 46.2 % (ref 38.5–50.0)
Hemoglobin: 15.5 g/dL (ref 13.2–17.1)
Lymphs Abs: 2448 cells/uL (ref 850–3900)
MCH: 30.9 pg (ref 27.0–33.0)
MCHC: 33.5 g/dL (ref 32.0–36.0)
MCV: 92 fL (ref 80.0–100.0)
MPV: 12.1 fL (ref 7.5–12.5)
Monocytes Relative: 5.3 %
Neutro Abs: 6033 cells/uL (ref 1500–7800)
Neutrophils Relative %: 66.3 %
Platelets: 159 10*3/uL (ref 140–400)
RBC: 5.02 10*6/uL (ref 4.20–5.80)
RDW: 12.7 % (ref 11.0–15.0)
Total Lymphocyte: 26.9 %
WBC: 9.1 10*3/uL (ref 3.8–10.8)

## 2022-03-05 LAB — H. PYLORI BREATH TEST: H. pylori Breath Test: NOT DETECTED

## 2022-03-05 LAB — LIPASE: Lipase: 26 U/L (ref 7–60)

## 2022-03-09 ENCOUNTER — Other Ambulatory Visit: Payer: Self-pay | Admitting: Physician Assistant

## 2022-03-09 DIAGNOSIS — R112 Nausea with vomiting, unspecified: Secondary | ICD-10-CM

## 2022-03-18 DIAGNOSIS — Z1211 Encounter for screening for malignant neoplasm of colon: Secondary | ICD-10-CM | POA: Diagnosis not present

## 2022-03-23 ENCOUNTER — Other Ambulatory Visit: Payer: Self-pay

## 2022-03-23 ENCOUNTER — Ambulatory Visit (INDEPENDENT_AMBULATORY_CARE_PROVIDER_SITE_OTHER): Payer: BC Managed Care – PPO | Admitting: Family Medicine

## 2022-03-23 ENCOUNTER — Ambulatory Visit: Payer: BC Managed Care – PPO | Admitting: Endocrinology

## 2022-03-23 ENCOUNTER — Encounter: Payer: Self-pay | Admitting: Internal Medicine

## 2022-03-23 ENCOUNTER — Ambulatory Visit: Payer: BC Managed Care – PPO | Admitting: Internal Medicine

## 2022-03-23 ENCOUNTER — Encounter: Payer: Self-pay | Admitting: Family Medicine

## 2022-03-23 VITALS — BP 120/80 | HR 100 | Ht 69.0 in | Wt 150.0 lb

## 2022-03-23 VITALS — BP 110/73 | HR 78 | Temp 98.7°F | Ht 69.0 in | Wt 156.6 lb

## 2022-03-23 DIAGNOSIS — N529 Male erectile dysfunction, unspecified: Secondary | ICD-10-CM

## 2022-03-23 DIAGNOSIS — Z0001 Encounter for general adult medical examination with abnormal findings: Secondary | ICD-10-CM | POA: Diagnosis not present

## 2022-03-23 DIAGNOSIS — F17200 Nicotine dependence, unspecified, uncomplicated: Secondary | ICD-10-CM | POA: Diagnosis not present

## 2022-03-23 DIAGNOSIS — R739 Hyperglycemia, unspecified: Secondary | ICD-10-CM

## 2022-03-23 DIAGNOSIS — Z23 Encounter for immunization: Secondary | ICD-10-CM

## 2022-03-23 DIAGNOSIS — Z125 Encounter for screening for malignant neoplasm of prostate: Secondary | ICD-10-CM | POA: Diagnosis not present

## 2022-03-23 DIAGNOSIS — E785 Hyperlipidemia, unspecified: Secondary | ICD-10-CM | POA: Diagnosis not present

## 2022-03-23 DIAGNOSIS — F17210 Nicotine dependence, cigarettes, uncomplicated: Secondary | ICD-10-CM

## 2022-03-23 DIAGNOSIS — E119 Type 2 diabetes mellitus without complications: Secondary | ICD-10-CM

## 2022-03-23 DIAGNOSIS — Z794 Long term (current) use of insulin: Secondary | ICD-10-CM

## 2022-03-23 DIAGNOSIS — R1110 Vomiting, unspecified: Secondary | ICD-10-CM

## 2022-03-23 DIAGNOSIS — R110 Nausea: Secondary | ICD-10-CM

## 2022-03-23 LAB — POCT GLYCOSYLATED HEMOGLOBIN (HGB A1C): Hemoglobin A1C: 6.8 % — AB (ref 4.0–5.6)

## 2022-03-23 LAB — POCT GLUCOSE (DEVICE FOR HOME USE): Glucose Fasting, POC: 179 mg/dL — AB (ref 70–99)

## 2022-03-23 MED ORDER — PIOGLITAZONE HCL 30 MG PO TABS: 30.0000 mg | ORAL_TABLET | Freq: Every day | ORAL | 3 refills | Status: DC

## 2022-03-23 MED ORDER — VARDENAFIL HCL 20 MG PO TABS: ORAL_TABLET | ORAL | 2 refills | Status: DC

## 2022-03-23 MED ORDER — METFORMIN HCL ER 500 MG PO TB24: 2000.0000 mg | ORAL_TABLET | Freq: Every day | ORAL | 3 refills | Status: DC

## 2022-03-23 MED ORDER — DAPAGLIFLOZIN PROPANEDIOL 10 MG PO TABS: 10.0000 mg | ORAL_TABLET | Freq: Every day | ORAL | 3 refills | Status: DC

## 2022-03-23 MED ORDER — REPAGLINIDE 2 MG PO TABS: 2.0000 mg | ORAL_TABLET | Freq: Three times a day (TID) | ORAL | 3 refills | Status: DC

## 2022-03-23 NOTE — Progress Notes (Signed)
Name: Wayne Diaz  Age/ Sex: 61 y.o., male   MRN/ DOB: [id number], 1961-02-24     PCP: Wayne Dark, MD   Reason for Endocrinology Evaluation: Type 2 Diabetes Mellitus  Initial Endocrine Consultative Visit: 10/17/2015    PATIENT IDENTIFIER: [name] is a 61 y.o. male with a past medical history of T2DM, Dyslipidemia . The patient has followed with Endocrinology clinic since 10/17/2015 for consultative assistance with management of his diabetes.  DIABETIC HISTORY:  Wayne Diaz was diagnosed with DM 2002. His hemoglobin A1c has ranged from 6.8% in 2023, peaking at 9.4% in 2015.   He has been on Trulicity in the past, started Ozempic   Transitioned from Dr. Everardo Diaz 03/2022 SUBJECTIVE:   During the last visit (12/22/2021): A1c 7.5 % Saw Dr. Everardo Diaz   Today (03/23/2022): Wayne Diaz  is here for a follow up on diabetes management. He checks his blood sugars 1 times daily. The patient has not had hypoglycemic episodes since the last clinic visit.  He was on trulicity for ~ 2 yrs and while on the last dose he developed morning vomiting ~7 weeks ago , he started the Ozempic  after the start of Vomiting which was 6 weeks ago  Has chronic diarrhea    Scheduled with Dr. Jimmey Diaz today   He eats 2-3 meals day   HOME DIABETES REGIMEN:  Farxiga 5 mg daily  Metformin 500 mg XR, 2 tabs BID  tabs  Pioglitazone 30 mg daily  Repaglinide 2 mg , 1 tablet BID  Ozempic 1 mg weekly      Statin: yes  ACE-I/ARB: no    METER DOWNLOAD SUMMARY: n/a  114-150 mg/dL     DIABETIC COMPLICATIONS: Microvascular complications:  Denies: CKD, retinopathy, neuropathy Last Eye Exam: scheduled 04/2022  Macrovascular complications:   Denies: CAD, CVA, PVD   HISTORY:  Past Medical History:  Past Medical History:  Diagnosis Date   ALLERGIC RHINITIS 05/05/2008   ASTHMA 05/05/2008   DIABETES MELLITUS, TYPE II 04/28/2008   GERD 05/05/2008   HYPERLIPIDEMIA 05/05/2008   HYPERSOMNIA 03/10/2009    SMOKER 03/10/2009   Past Surgical History:  Past Surgical History:  Procedure Laterality Date   s/p left knee surgury     ACL repair   TONSILLECTOMY     Social History:  reports that he has been smoking cigarettes. He has a 35.00 pack-year smoking history. He has never used smokeless tobacco. He reports that he does not drink alcohol and does not use drugs. Family History:  Family History  Problem Relation Age of Onset   Arthritis Other    Hyperlipidemia Other    Heart disease Other    Hypertension Other    Diabetes Other      HOME MEDICATIONS: Allergies as of 03/23/2022   No Known Allergies      Medication List        Accurate as of Mar 23, 2022  9:16 AM. If you have any questions, ask your nurse or doctor.          atorvastatin 40 MG tablet Commonly known as: LIPITOR TAKE 1 TABLET BY MOUTH EVERY DAY   Farxiga 5 MG Tabs tablet Generic drug: dapagliflozin propanediol TAKE 1 TABLET (5 MG TOTAL) BY MOUTH DAILY BEFORE BREAKFAST.   glucose-Vitamin C 4-0.006 GM Chew chewable tablet Chew 1 tablet by mouth.   metFORMIN 500 MG 24 hr tablet Commonly known as: GLUCOPHAGE-XR TAKE 4 TABLETS (2,000 MG TOTAL) BY MOUTH DAILY.  ondansetron 4 MG disintegrating tablet Commonly known as: ZOFRAN-ODT Take 1 tablet (4 mg total) by mouth every 8 (eight) hours as needed for nausea or vomiting. SUBLINGUAL   ONE TOUCH DELICA LANCING DEV Misc Used to check blood sugars.   Ozempic (1 MG/DOSE) 2 MG/1.5ML Sopn Generic drug: Semaglutide (1 MG/DOSE) Inject into the skin.   pantoprazole 20 MG tablet Commonly known as: PROTONIX TAKE 1 TABLET BY MOUTH TWICE DAILY   pioglitazone 30 MG tablet Commonly known as: Actos Take 1 tablet (30 mg total) by mouth daily.   repaglinide 2 MG tablet Commonly known as: PRANDIN Take 1 tablet (2 mg total) by mouth 2 (two) times daily before a meal.   vardenafil 20 MG tablet Commonly known as: LEVITRA TAKE 1 TABLET BY MOUTH EVERY DAY AS NEEDED  FOR ERECTILE DYSFUNCTION         OBJECTIVE:   Vital Signs: BP 120/80 (BP Location: Left Arm, Patient Position: Sitting, Cuff Size: Small)   Pulse 100   Ht 5\' 9"  (1.753 m)   Wt 150 lb (68 kg) Comment: Patient reported  SpO2 99%   BMI 22.15 kg/m   Wt Readings from Last 3 Encounters:  03/23/22 150 lb (68 kg)  03/02/22 157 lb (71.2 kg)  02/07/22 157 lb (71.2 kg)     Exam: General: Pt appears well and is in NAD  Lungs: Clear with good BS bilat with no rales, rhonchi, or wheezes  Heart: RRR   Abdomen: soft, nontender, without masses  Extremities: No pretibial edema. No tremor. Normal strength and motion throughout. See detailed diabetic foot exam below.  Neuro: MS is good with appropriate affect, pt is alert and Ox3    DM foot exam: 03/23/2022  The skin of the feet is intact without sores or ulcerations. The pedal pulses are 2+ on right and 2+ on left. The sensation is intact to a screening 5.07, 10 gram monofilament bilaterally          DATA REVIEWED:  Lab Results  Component Value Date   HGBA1C 6.8 (A) 03/23/2022   HGBA1C 7.5 (A) 12/22/2021   HGBA1C 7.6 (A) 09/22/2021   Lab Results  Component Value Date   MICROALBUR 1.6 04/04/2016   LDLCALC 128 (H) 07/27/2020   CREATININE 0.70 03/02/2022   Lab Results  Component Value Date   MICRALBCREAT 1.0 04/04/2016     Lab Results  Component Value Date   CHOL 193 07/27/2020   HDL 33 (L) 07/27/2020   LDLCALC 128 (H) 07/27/2020   LDLDIRECT 79.0 03/08/2015   TRIG 184 (H) 07/27/2020   CHOLHDL 5.8 (H) 07/27/2020         ASSESSMENT / PLAN / RECOMMENDATIONS:   1) Type 2 Diabetes Mellitus, Optimally controlled, With out complications - Most recent A1c of 6.8 %. Goal A1c < 7.0 %.    - A1c at goal  - He is having vomiting and chronic diarrhea, will stop Ozempic  -He was advised to take repaglinide before each meal -We will increase 07/29/2020 as below    MEDICATIONS: Stop Ozempic Take repaglinide 2 mg 3 times  daily before every meal Increase Farxiga 10 mg daily Continue metformin 500 mg XR 2 tabs twice daily Continue Actos 30 mg daily  EDUCATION / INSTRUCTIONS: BG monitoring instructions: Patient is instructed to check his blood sugars 1 times a day, fasting. Call [facility] if: BG persistently < 70  I reviewed the Rule of 15 for the treatment of hypoglycemia in detail with  the patient. Literature supplied.   2) Diabetic complications:  Eye: Does not have known diabetic retinopathy.  Neuro/ Feet: Does not have known diabetic peripheral neuropathy .  Renal: Patient does not have known baseline CKD.    F/U in 4 months   Signed electronically by: Lyndle HerrlichAbby Jaralla Raney Koeppen, MD  [facility]  [facility] [address] [place], [place] [address] Phone: [phone] FAX: [phone]   CC: Wayne Darkarker, Caleb M, MD [address] [place] [address] Phone: [phone]  Fax: [phone]  Return to Endocrinology clinic as below: Future Appointments  Date Time Provider Department Center  03/23/2022  9:50 AM Justis Dupas, Konrad DoloresIbtehal Jaralla, MD [facility] None  03/23/2022  2:00 PM Wayne DarkParker, Caleb M, MD [facility]

## 2022-03-23 NOTE — Assessment & Plan Note (Signed)
Continue management per endocrinology.  His last dose of Ozempic was 2 days ago.  He will not be taking any further until his nausea and vomiting resolved.

## 2022-03-23 NOTE — Assessment & Plan Note (Signed)
We will place referral to lung cancer screening program.

## 2022-03-23 NOTE — Addendum Note (Signed)
Addended by: Royann Shivers on: 03/23/2022 03:08 PM   Modules accepted: Orders

## 2022-03-23 NOTE — Patient Instructions (Signed)
It was very nice to see you today!  We will refer you to see gastroenterologist.  I will refill your medications today.    We will set you up for lung cancer screening as well.  We will check blood work today.    We will give your shingles vaccine today.  Please continue work with diet and exercise.    I would like for you to stop smoking.  Please send me a message in a couple of months to let me know when you are ready to try new medication.  We will see back in year for your next physical.  Come back sooner if needed.  Take care, Dr Jimmey Ralph  PLEASE NOTE:  If you had any lab tests please let us know if you have not heard back within a few days. You may see your results on mychart before we have a chance to review them but we will give you a call once they are reviewed by [place]. If we ordered any referrals today, please let us know if you have not heard from their office within the next week.   Please try these tips to maintain a healthy lifestyle:  Eat at least 3 REAL meals and 1-2 snacks per day.  Aim for no more than 5 hours between eating.  If you eat breakfast, please do so within one hour of getting up.   Each meal should contain half fruits/vegetables, one quarter protein, and one quarter carbs (no bigger than a computer mouse)  Cut down on sweet beverages. This includes juice, soda, and sweet tea.   Drink at least 1 glass of water with each meal and aim for at least 8 glasses per day  Exercise at least 150 minutes every week.    Preventive Care 62-18 Years Old, Male Preventive care refers to lifestyle choices and visits with your health care provider that can promote health and wellness. Preventive care visits are also called wellness exams. What can I expect for my preventive care visit? Counseling During your preventive care visit, your health care provider may ask about your: Medical history, including: Past medical problems. Family medical history. Current health,  including: Emotional well-being. Home life and relationship well-being. Sexual activity. Lifestyle, including: Alcohol, nicotine or tobacco, and drug use. Access to firearms. Diet, exercise, and sleep habits. Safety issues such as seatbelt and bike helmet use. Sunscreen use. Work and work Astronomer. Physical exam Your health care provider will check your: Height and weight. These may be used to calculate your BMI (body mass index). BMI is a measurement that tells if you are at a healthy weight. Waist circumference. This measures the distance around your waistline. This measurement also tells if you are at a healthy weight and may help predict your risk of certain diseases, such as type 2 diabetes and high blood pressure. Heart rate and blood pressure. Body temperature. Skin for abnormal spots. What immunizations do I need?  Vaccines are usually given at various ages, according to a schedule. Your health care provider will recommend vaccines for you based on your age, medical history, and lifestyle or other factors, such as travel or where you work. What tests do I need? Screening Your health care provider may recommend screening tests for certain conditions. This may include: Lipid and cholesterol levels. Diabetes screening. This is done by checking your blood sugar (glucose) after you have not eaten for a while (fasting). Hepatitis B test. Hepatitis C test. HIV (human immunodeficiency virus) test. STI (  sexually transmitted infection) testing, if you are at risk. Lung cancer screening. Prostate cancer screening. Colorectal cancer screening. Talk with your health care provider about your test results, treatment options, and if necessary, the need for more tests. Follow these instructions at home: Eating and drinking  Eat a diet that includes fresh fruits and vegetables, whole grains, lean protein, and low-fat dairy products. Take vitamin and mineral supplements as recommended  by your health care provider. Do not drink alcohol if your health care provider tells you not to drink. If you drink alcohol: Limit how much you have to 0-2 drinks a day. Know how much alcohol is in your drink. In the [place] one drink equals one 12 oz bottle of beer (355 mL), one 5 oz glass of wine (148 mL), or one 1 oz glass of hard liquor (44 mL). Lifestyle Brush your teeth every morning and night with fluoride toothpaste. Floss one time each day. Exercise for at least 30 minutes 5 or more days each week. Do not use any products that contain nicotine or tobacco. These products include cigarettes, chewing tobacco, and vaping devices, such as e-cigarettes. If you need help quitting, ask your health care provider. Do not use drugs. If you are sexually active, practice safe sex. Use a condom or other form of protection to prevent STIs. Take aspirin only as told by your health care provider. Make sure that you understand how much to take and what form to take. Work with your health care provider to find out whether it is safe and beneficial for you to take aspirin daily. Find healthy ways to manage stress, such as: Meditation, yoga, or listening to music. Journaling. Talking to a trusted person. Spending time with friends and family. Minimize exposure to UV radiation to reduce your risk of skin cancer. Safety Always wear your seat belt while driving or riding in a vehicle. Do not drive: If you have been drinking alcohol. Do not ride with someone who has been drinking. When you are tired or distracted. While texting. If you have been using any mind-altering substances or drugs. Wear a helmet and other protective equipment during sports activities. If you have firearms in your house, make sure you follow all gun safety procedures. What's next? Go to your health care provider once a year for an annual wellness visit. Ask your health care provider how often you should have your eyes and teeth  checked. Stay up to date on all vaccines. This information is not intended to replace advice given to you by your health care provider. Make sure you discuss any questions you have with your health care provider. Document Revised: 04/19/2021 Document Reviewed: 04/19/2021 Elsevier Patient Education  2023 [employer].

## 2022-03-23 NOTE — Progress Notes (Signed)
Chief Complaint:  Wayne Diaz is a 61 y.o. male who presents today for his annual comprehensive physical exam.    Assessment/Plan:  New/Acute Problems: Nausea and vomiting Possibly secondary to his Ozempic use however we will place referral to GI to rule out any other possible causes.  His weight has been overall stable.  We will check labs today.  Chronic Problems Addressed Today: Type 2 diabetes mellitus without complication, with long-term current use of insulin (HCC) Continue management per endocrinology.  His last dose of Ozempic was 2 days ago.  He will not be taking any further until his nausea and vomiting resolved.  Nicotine dependence with current use Patient was asked about his tobacco use today and was strongly advised to quit. Patient is currently contemplative. We reviewed treatment options to assist him quit smoking including NRT, Chantix, and Bupropion.  We will avoid adding on any other medications for now until he addresses nausea as above.  He will send me a message in a few weeks to months via MyChart when he is ready to start.  We can try bupropion.  Follow up at next office visit.   Total time spent counseling approximately 3 minutes.    Hyperlipidemia Check lipids.  He is on Lipitor 40 mg daily.  Smokes with greater than 30 pack year history We will place referral to lung cancer screening program.  Preventative Healthcare: Check labs.  Shingles vaccine given today.  We will place referral for lung cancer screening.  Patient Counseling(The following topics were reviewed and/or handout was given):  -Nutrition: Stressed importance of moderation in sodium/caffeine intake, saturated fat and cholesterol, caloric balance, sufficient intake of fresh fruits, vegetables, and fiber.  -Stressed the importance of regular exercise.   -Substance Abuse: Discussed cessation/primary prevention of tobacco, alcohol, or other drug use; driving or other dangerous activities under  the influence; availability of treatment for abuse.   -Injury prevention: Discussed safety belts, safety helmets, smoke detector, smoking near bedding or upholstery.   -Sexuality: Discussed sexually transmitted diseases, partner selection, use of condoms, avoidance of unintended pregnancy and contraceptive alternatives.   -Dental health: Discussed importance of regular tooth brushing, flossing, and dental visits.  -Health maintenance and immunizations reviewed. Please refer to Health maintenance section.  Return to care in 1 year for next preventative visit.     Subjective:  HPI:  He has no acute complaints today.   Patient her with persistent nausea and vomiting. This has been going on for a couple of months. He was seen here in this clinic a couple of times over that time by different providers.  He tried Zofran with modest improvement.  He has nausea with vomiting nearly every morning.  Feels like food is sitting in his stomach.  He will throw up his whole dinner in the morning. He saw his endocrinologist this morning and they stopped his ozempic.      02/07/2022   11:30 AM  Depression screen PHQ 2/9  Decreased Interest 0  Down, Depressed, Hopeless 0  PHQ - 2 Score 0    Health Maintenance Due  Topic Date Due   Fecal DNA (Cologuard)  Never done   Zoster Vaccines- Shingrix (1 of 2) Never done   URINE MICROALBUMIN  04/04/2017     ROS: Per HPI, otherwise a complete review of systems was negative.   PMH:  The following were reviewed and entered/updated in epic: Past Medical History:  Diagnosis Date   ALLERGIC RHINITIS 05/05/2008  ASTHMA 05/05/2008   DIABETES MELLITUS, TYPE II 04/28/2008   GERD 05/05/2008   HYPERLIPIDEMIA 05/05/2008   HYPERSOMNIA 03/10/2009   SMOKER 03/10/2009   Patient Active Problem List   Diagnosis Date Noted   Type 2 diabetes mellitus without complication, with long-term current use of insulin (HCC) 03/23/2022   Smokes with greater than 30 pack year history  03/02/2022   Weight loss 01/20/2021   Neck pain 01/19/2019   Anxiety state 08/26/2014   Hypersomnia 09/11/2013   Olecranon bursitis of right elbow 10/14/2012   Gout 03/07/2012   BACK PAIN 06/22/2010   Nicotine dependence with current use 03/10/2009   HYPERSOMNIA 03/10/2009   Hyperlipidemia 05/05/2008   ALLERGIC RHINITIS 05/05/2008   ASTHMA 05/05/2008   GERD 05/05/2008   Diabetes (HCC) 04/28/2008   Past Surgical History:  Procedure Laterality Date   s/p left knee surgury     ACL repair   TONSILLECTOMY      Family History  Problem Relation Age of Onset   Arthritis Other    Hyperlipidemia Other    Heart disease Other    Hypertension Other    Diabetes Other     Medications- reviewed and updated Current Outpatient Medications  Medication Sig Dispense Refill   atorvastatin (LIPITOR) 40 MG tablet TAKE 1 TABLET BY MOUTH EVERY DAY 90 tablet 3   dapagliflozin propanediol (FARXIGA) 10 MG TABS tablet Take 1 tablet (10 mg total) by mouth daily before breakfast. 90 tablet 3   Lancet Devices (ONE TOUCH DELICA LANCING DEV) MISC Used to check blood sugars. 1 each 0   metFORMIN (GLUCOPHAGE-XR) 500 MG 24 hr tablet Take 4 tablets (2,000 mg total) by mouth daily. 360 tablet 3   pantoprazole (PROTONIX) 20 MG tablet TAKE 1 TABLET BY MOUTH TWICE DAILY 30 tablet 0   pioglitazone (ACTOS) 30 MG tablet Take 1 tablet (30 mg total) by mouth daily. 90 tablet 3   repaglinide (PRANDIN) 2 MG tablet Take 1 tablet (2 mg total) by mouth 3 (three) times daily before meals. 270 tablet 3   glucose-Vitamin C 4-0.006 GM CHEW chewable tablet Chew 1 tablet by mouth. (Patient not taking: Reported on 03/23/2022)     ondansetron (ZOFRAN-ODT) 4 MG disintegrating tablet Take 1 tablet (4 mg total) by mouth every 8 (eight) hours as needed for nausea or vomiting. SUBLINGUAL (Patient not taking: Reported on 03/23/2022) 20 tablet 0   vardenafil (LEVITRA) 20 MG tablet TAKE 1 TABLET BY MOUTH EVERY DAY AS NEEDED FOR ERECTILE  DYSFUNCTION 6 tablet 2   No current facility-administered medications for this visit.    Allergies-reviewed and updated No Known Allergies  Social History   Socioeconomic History   Marital status: Married    Spouse name: Not on file   Number of children: 4   Years of education: 16   Highest education level: Not on file  Occupational History   Occupation: banking business  Tobacco Use   Smoking status: Every Day    Packs/day: 1.00    Years: 35.00    Pack years: 35.00    Types: Cigarettes   Smokeless tobacco: Never  Substance and Sexual Activity   Alcohol use: No   Drug use: No   Sexual activity: Not on file  Other Topics Concern   Not on file  Social History Narrative   Born and raised in [place], [place]. Raised [place]. Currently resides in house with his wife and daughter. Fun: Yard work, baseball.   Denies religious beliefs effecting health  care.    Social Determinants of Health   Financial Resource Strain: Not on file  Food Insecurity: Not on file  Transportation Needs: Not on file  Physical Activity: Not on file  Stress: Not on file  Social Connections: Not on file        Objective:  Physical Exam: BP 110/73 (BP Location: Left Arm)   Pulse 78   Temp 98.7 F (37.1 C) (Temporal)   Ht 5\' 9"  (1.753 m)   Wt 156 lb 9.6 oz (71 kg)   SpO2 95%   BMI 23.13 kg/m   Body mass index is 23.13 kg/m. Wt Readings from Last 3 Encounters:  03/23/22 156 lb 9.6 oz (71 kg)  03/23/22 150 lb (68 kg)  03/02/22 157 lb (71.2 kg)   Gen: NAD, resting comfortably HEENT: TMs normal bilaterally. OP clear. No thyromegaly noted.  CV: RRR with no murmurs appreciated Pulm: NWOB, CTAB with scattered wheezes GI: Normal bowel sounds present. Soft, Nontender, Nondistended. MSK: no edema, cyanosis, or clubbing noted Skin: warm, dry Neuro: CN2-12 grossly intact. Strength 5/5 in upper and lower extremities. Reflexes symmetric and intact bilaterally.  Psych: Normal affect and  thought content     Willard Farquharson M. Jimmey RalphParker, MD 03/23/2022 3:01 PM

## 2022-03-23 NOTE — Assessment & Plan Note (Signed)
Check lipids.  He is on Lipitor 40 mg daily. 

## 2022-03-23 NOTE — Assessment & Plan Note (Signed)
Patient was asked about his tobacco use today and was strongly advised to quit. Patient is currently contemplative. We reviewed treatment options to assist him quit smoking including NRT, Chantix, and Bupropion.  We will avoid adding on any other medications for now until he addresses nausea as above.  He will send me a message in a few weeks to months via MyChart when he is ready to start.  We can try bupropion.  Follow up at next office visit.   Total time spent counseling approximately 3 minutes.

## 2022-03-23 NOTE — Patient Instructions (Signed)
-   STOP Ozempic  - Take Repaglinide 2 mg , 1 tablet before each meal  - Increase farxiga 10 mg , 1 tablet every morning  - Continue Metformin 500 mg XR 2 tablets twice a day  - Continue Actos 30 mg daily      HOW TO TREAT LOW BLOOD SUGARS (Blood sugar LESS THAN 70 MG/DL) Please follow the RULE OF 15 for the treatment of hypoglycemia treatment (when your (blood sugars are less than 70 mg/dL)   STEP 1: Take 15 grams of carbohydrates when your blood sugar is low, which includes:  3-4 GLUCOSE TABS  OR 3-4 OZ OF JUICE OR REGULAR SODA OR ONE TUBE OF GLUCOSE GEL    STEP 2: RECHECK blood sugar in 15 MINUTES STEP 3: If your blood sugar is still low at the 15 minute recheck --> then, go back to STEP 1 and treat AGAIN with another 15 grams of carbohydrates.

## 2022-03-24 ENCOUNTER — Other Ambulatory Visit: Payer: Self-pay | Admitting: Physician Assistant

## 2022-03-24 DIAGNOSIS — R112 Nausea with vomiting, unspecified: Secondary | ICD-10-CM

## 2022-03-24 LAB — CBC
HCT: 45 % (ref 38.5–50.0)
Hemoglobin: 15.5 g/dL (ref 13.2–17.1)
MCH: 31.8 pg (ref 27.0–33.0)
MCHC: 34.4 g/dL (ref 32.0–36.0)
MCV: 92.4 fL (ref 80.0–100.0)
MPV: 12.2 fL (ref 7.5–12.5)
Platelets: 172 10*3/uL (ref 140–400)
RBC: 4.87 10*6/uL (ref 4.20–5.80)
RDW: 12.5 % (ref 11.0–15.0)
WBC: 8.8 10*3/uL (ref 3.8–10.8)

## 2022-03-24 LAB — LIPID PANEL
Cholesterol: 104 mg/dL (ref <200)
HDL: 33 mg/dL — ABNORMAL LOW (ref >=40)
LDL Cholesterol (Calc): 52 mg/dL (calc)
Non-HDL Cholesterol (Calc): 71 mg/dL (calc) (ref <130)
Total CHOL/HDL Ratio: 3.2 (calc) (ref <5.0)
Triglycerides: 104 mg/dL (ref <150)

## 2022-03-24 LAB — COMPREHENSIVE METABOLIC PANEL
AG Ratio: 2.1 (calc) (ref 1.0–2.5)
ALT: 17 U/L (ref 9–46)
AST: 14 U/L (ref 10–35)
Albumin: 4.2 g/dL (ref 3.6–5.1)
Alkaline phosphatase (APISO): 61 U/L (ref 35–144)
BUN: 16 mg/dL (ref 7–25)
CO2: 25 mmol/L (ref 20–32)
Calcium: 9.7 mg/dL (ref 8.6–10.3)
Chloride: 102 mmol/L (ref 98–110)
Creat: 0.74 mg/dL (ref 0.70–1.35)
Globulin: 2 g/dL (calc) (ref 1.9–3.7)
Glucose, Bld: 136 mg/dL — ABNORMAL HIGH (ref 65–99)
Potassium: 4.8 mmol/L (ref 3.5–5.3)
Sodium: 140 mmol/L (ref 135–146)
Total Bilirubin: 0.6 mg/dL (ref 0.2–1.2)
Total Protein: 6.2 g/dL (ref 6.1–8.1)

## 2022-03-24 LAB — TSH: TSH: 0.63 mIU/L (ref 0.40–4.50)

## 2022-03-24 LAB — PSA: PSA: 0.27 ng/mL (ref <=4.00)

## 2022-03-27 NOTE — Progress Notes (Signed)
Please inform patient of the following:  Labs are all normal.  Do not need to make any changes at this time.  He should keep up the good work and we can recheck in a year.

## 2022-03-28 LAB — COLOGUARD: COLOGUARD: NEGATIVE

## 2022-03-30 ENCOUNTER — Encounter: Payer: Self-pay | Admitting: Family Medicine

## 2022-04-06 ENCOUNTER — Other Ambulatory Visit: Payer: Self-pay | Admitting: Family Medicine

## 2022-04-06 DIAGNOSIS — R112 Nausea with vomiting, unspecified: Secondary | ICD-10-CM

## 2022-04-11 DIAGNOSIS — L02415 Cutaneous abscess of right lower limb: Secondary | ICD-10-CM | POA: Diagnosis not present

## 2022-04-20 ENCOUNTER — Other Ambulatory Visit: Payer: Self-pay

## 2022-04-20 DIAGNOSIS — F17210 Nicotine dependence, cigarettes, uncomplicated: Secondary | ICD-10-CM

## 2022-04-20 DIAGNOSIS — Z122 Encounter for screening for malignant neoplasm of respiratory organs: Secondary | ICD-10-CM

## 2022-04-20 DIAGNOSIS — Z87891 Personal history of nicotine dependence: Secondary | ICD-10-CM

## 2022-04-27 ENCOUNTER — Ambulatory Visit (INDEPENDENT_AMBULATORY_CARE_PROVIDER_SITE_OTHER): Payer: BC Managed Care – PPO | Admitting: Acute Care

## 2022-04-27 ENCOUNTER — Encounter: Payer: Self-pay | Admitting: Acute Care

## 2022-04-27 ENCOUNTER — Telehealth: Payer: Self-pay | Admitting: Acute Care

## 2022-04-27 DIAGNOSIS — F17210 Nicotine dependence, cigarettes, uncomplicated: Secondary | ICD-10-CM | POA: Diagnosis not present

## 2022-05-02 ENCOUNTER — Ambulatory Visit (INDEPENDENT_AMBULATORY_CARE_PROVIDER_SITE_OTHER)
Admission: RE | Admit: 2022-05-02 | Discharge: 2022-05-02 | Disposition: A | Discharge status: Home / Self Care | Payer: BC Managed Care – PPO | Source: Ambulatory Visit | Attending: Internal Medicine | Admitting: Internal Medicine

## 2022-05-02 DIAGNOSIS — Z87891 Personal history of nicotine dependence: Secondary | ICD-10-CM | POA: Diagnosis not present

## 2022-05-02 DIAGNOSIS — F17210 Nicotine dependence, cigarettes, uncomplicated: Secondary | ICD-10-CM

## 2022-05-02 DIAGNOSIS — Z122 Encounter for screening for malignant neoplasm of respiratory organs: Secondary | ICD-10-CM

## 2022-05-03 NOTE — Telephone Encounter (Signed)
Noted.  Will close encounter.  

## 2022-05-04 ENCOUNTER — Other Ambulatory Visit: Payer: Self-pay

## 2022-05-04 DIAGNOSIS — Z122 Encounter for screening for malignant neoplasm of respiratory organs: Secondary | ICD-10-CM

## 2022-05-04 DIAGNOSIS — F17210 Nicotine dependence, cigarettes, uncomplicated: Secondary | ICD-10-CM

## 2022-05-04 DIAGNOSIS — Z87891 Personal history of nicotine dependence: Secondary | ICD-10-CM

## 2022-05-07 DIAGNOSIS — L0293 Carbuncle, unspecified: Secondary | ICD-10-CM | POA: Diagnosis not present

## 2022-05-07 DIAGNOSIS — L0292 Furuncle, unspecified: Secondary | ICD-10-CM | POA: Diagnosis not present

## 2022-05-13 ENCOUNTER — Encounter: Payer: Self-pay | Admitting: Family Medicine

## 2022-05-13 ENCOUNTER — Encounter (INDEPENDENT_AMBULATORY_CARE_PROVIDER_SITE_OTHER): Payer: BC Managed Care – PPO | Admitting: Internal Medicine

## 2022-05-13 DIAGNOSIS — Z794 Long term (current) use of insulin: Secondary | ICD-10-CM

## 2022-05-13 DIAGNOSIS — E119 Type 2 diabetes mellitus without complications: Secondary | ICD-10-CM

## 2022-05-15 ENCOUNTER — Other Ambulatory Visit: Payer: Self-pay | Admitting: *Deleted

## 2022-05-15 DIAGNOSIS — I2510 Atherosclerotic heart disease of native coronary artery without angina pectoris: Secondary | ICD-10-CM

## 2022-05-15 MED ORDER — TRULICITY 0.75 MG/0.5ML ~~LOC~~ SOAJ: 0.7500 mg | SUBCUTANEOUS | 2 refills | Status: DC

## 2022-05-15 NOTE — Telephone Encounter (Signed)
Looks like his pulmonologist are the ones that ordered the test. Ok with referral.  Katina Degree. Jimmey Ralph, MD 05/15/2022 12:40 PM

## 2022-05-15 NOTE — Telephone Encounter (Signed)
Please advise 

## 2022-05-15 NOTE — Telephone Encounter (Signed)
Spoke with patient Patient aware Referral placed

## 2022-05-15 NOTE — Telephone Encounter (Signed)
Please see the MyChart message reply(ies) for my assessment and plan.    This patient gave consent for this Medical Advice Message and is aware that it may result in a bill to [employer], as well as the possibility of receiving a bill for a co-payment or deductible. They are an established patient, but are not seeking medical advice exclusively about a problem treated during an in person or video visit in the last seven days. I did not recommend an in person or video visit within seven days of my reply.    I spent a total of 10 minutes cumulative time within 7 days through [employer].  Scarlette Shorts, MD

## 2022-06-01 ENCOUNTER — Ambulatory Visit: Payer: BC Managed Care – PPO | Admitting: Internal Medicine

## 2022-06-01 ENCOUNTER — Encounter: Payer: Self-pay | Admitting: Internal Medicine

## 2022-06-01 VITALS — BP 124/70 | HR 92 | Ht 69.0 in | Wt 162.0 lb

## 2022-06-01 DIAGNOSIS — E785 Hyperlipidemia, unspecified: Secondary | ICD-10-CM | POA: Diagnosis not present

## 2022-06-01 DIAGNOSIS — R931 Abnormal findings on diagnostic imaging of heart and coronary circulation: Secondary | ICD-10-CM

## 2022-06-01 MED ORDER — ASPIRIN 81 MG PO TBEC: 81.0000 mg | DELAYED_RELEASE_TABLET | Freq: Every day | ORAL | Status: DC

## 2022-06-01 NOTE — Progress Notes (Signed)
Cardiology Office Note:    Date:  06/01/2022   ID:  Wayne Diaz, DOB July 09, 1961, MRN [id number]  PCP:  Vivi Barrack, MD   [facility] Providers Cardiologist:  None     Referring MD: Vivi Barrack, MD   Chief Complaint  Patient presents with   New Patient (Initial Visit)    History of Present Illness:    Wayne Diaz is a 61 y.o. male with a hx of asthma, DM2, GERD, smoker for 35 years, CAC 1079 involving LM and 3 vessel dx. He works out in the yard a lot. He denies chest pressure or SOB. He gets some dizziness. He is from [place], he may have had a stress test in the past. His mother had hx of heart disease. Mother's side had heart dx. PGF had heart disease. Father had emphysema. He's taking his stain. LDL 52 mg/dL  Past Medical History:  Diagnosis Date   ALLERGIC RHINITIS 05/05/2008   ASTHMA 05/05/2008   DIABETES MELLITUS, TYPE II 04/28/2008   GERD 05/05/2008   HYPERLIPIDEMIA 05/05/2008   HYPERSOMNIA 03/10/2009   SMOKER 03/10/2009    Past Surgical History:  Procedure Laterality Date   s/p left knee surgury     ACL repair   TONSILLECTOMY      Current Medications: Current Meds  Medication Sig   Ascorbic Acid (VITAMIN C) 500 MG CAPS Take 500 mg by mouth daily.   aspirin EC 81 MG tablet Take 1 tablet (81 mg total) by mouth daily. Swallow whole.   atorvastatin (LIPITOR) 40 MG tablet TAKE 1 TABLET BY MOUTH EVERY DAY   dapagliflozin propanediol (FARXIGA) 10 MG TABS tablet Take 1 tablet (10 mg total) by mouth daily before breakfast.   Dulaglutide (TRULICITY) A999333 0000000 SOPN Inject 0.75 mg into the skin once a week.   Lancet Devices (ONE TOUCH DELICA LANCING DEV) MISC Used to check blood sugars.   metFORMIN (GLUCOPHAGE-XR) 500 MG 24 hr tablet Take 4 tablets (2,000 mg total) by mouth daily. (Patient taking differently: Take 1,000 mg by mouth 2 (two) times daily.)   Multiple Vitamins-Minerals (CENTRUM ADULTS PO) Take by mouth.   Omega-3 Fatty Acids (FISH OIL PO) Take  2,400 mg by mouth daily.   pioglitazone (ACTOS) 30 MG tablet Take 1 tablet (30 mg total) by mouth daily.   repaglinide (PRANDIN) 2 MG tablet Take 1 tablet (2 mg total) by mouth 3 (three) times daily before meals. (Patient taking differently: Take 2 mg by mouth 2 (two) times daily before a meal.)   vardenafil (LEVITRA) 20 MG tablet TAKE 1 TABLET BY MOUTH EVERY DAY AS NEEDED FOR ERECTILE DYSFUNCTION   [DISCONTINUED] glucose-Vitamin C 4-0.006 GM CHEW chewable tablet Chew 1 tablet by mouth.   [DISCONTINUED] ondansetron (ZOFRAN-ODT) 4 MG disintegrating tablet Take 1 tablet (4 mg total) by mouth every 8 (eight) hours as needed for nausea or vomiting. SUBLINGUAL   [DISCONTINUED] pantoprazole (PROTONIX) 20 MG tablet TAKE 1 TABLET BY MOUTH TWICE A DAY     Allergies:   Patient has no known allergies.   Social History   Socioeconomic History   Marital status: Married    Spouse name: Not on file   Number of children: 4   Years of education: 16   Highest education level: Not on file  Occupational History   Occupation: banking business  Tobacco Use   Smoking status: Every Day    Packs/day: 1.00    Years: 35.00    Total pack years:  35.00    Types: Cigarettes   Smokeless tobacco: Never  Substance and Sexual Activity   Alcohol use: No   Drug use: No   Sexual activity: Not on file  Other Topics Concern   Not on file  Social History Narrative   Born and raised in [place], [place]. Raised [place]. Currently resides in house with his wife and daughter. Fun: Yard work, baseball.   Denies religious beliefs effecting health care.    Social Determinants of Health   Financial Resource Strain: Not on file  Food Insecurity: Not on file  Transportation Needs: Not on file  Physical Activity: Not on file  Stress: Not on file  Social Connections: Not on file     Family History: The patient's family history includes Arthritis in an other family member; Diabetes in an other family member;  Heart disease in an other family member; Hyperlipidemia in an other family member; Hypertension in an other family member.  ROS:   Please see the history of present illness.     All other systems reviewed and are negative.  EKGs/Labs/Other Studies Reviewed:    The following studies were reviewed today:   EKG:  EKG is  ordered today.  The ekg ordered today demonstrates   7/28- NSR  Recent Labs: 03/23/2022: ALT 17; BUN 16; Creat 0.74; Hemoglobin 15.5; Platelets 172; Potassium 4.8; Sodium 140; TSH 0.63   Recent Lipid Panel    Component Value Date/Time   CHOL 104 03/23/2022 1506   TRIG 104 03/23/2022 1506   HDL 33 (L) 03/23/2022 1506   CHOLHDL 3.2 03/23/2022 1506   VLDL 60.2 (H) 03/08/2015 0841   LDLCALC 52 03/23/2022 1506   LDLDIRECT 79.0 03/08/2015 0841     Risk Assessment/Calculations:           Physical Exam:    VS:  BP 124/70 (BP Location: Right Arm, Patient Position: Sitting, Cuff Size: Normal)   Pulse 92   Ht 5\' 9"  (1.753 m)   Wt 162 lb (73.5 kg)   BMI 23.92 kg/m     Wt Readings from Last 3 Encounters:  06/01/22 162 lb (73.5 kg)  03/23/22 156 lb 9.6 oz (71 kg)  03/23/22 150 lb (68 kg)     GEN:  Well nourished, well developed in no acute distress HEENT: Normal NECK: No JVD; No carotid bruits LYMPHATICS: No lymphadenopathy CARDIAC: RRR, no murmurs, rubs, gallops RESPIRATORY:  Clear to auscultation without rales, wheezing or rhonchi  ABDOMEN: Soft, non-tender, non-distended MUSCULOSKELETAL:  No edema; No deformity  SKIN: Warm and dry NEUROLOGIC:  Alert and oriented x 3 PSYCHIATRIC:  Normal affect   ASSESSMENT:   High CAC: 1097 . He is asymptomatic. His CAC is severe and involving the LM. Will plan for an exercise SPECT. He is at high risk. Low threshold for LHC. LDL goal < 70 mg/dL. He is at goal. Continue lipitor 40 mg daily.  DM2: recommend A1c < 7. On metformin.   Smoker: consider wellbutrin for smoking cessation. We discussed the importance of  quitting extensively   PLAN:    In order of problems listed above:  Start ASA 81 mg daily Exercise SPECT  Consider wellbutrin per PCP for smoking cessation Follow up 6 months         Medication Adjustments/Labs and Tests Ordered: Current medicines are reviewed at length with the patient today.  Concerns regarding medicines are outlined above.  Orders Placed This Encounter  Procedures   Cardiac Stress Test: Informed Consent  Details: Physician/Practitioner Attestation; Transcribe to consent form and obtain patient signature   MYOCARDIAL PERFUSION IMAGING   EKG 12-Lead   Meds ordered this encounter  Medications   aspirin EC 81 MG tablet    Sig: Take 1 tablet (81 mg total) by mouth daily. Swallow whole.    Patient Instructions  Medication Instructions:   -Start aspirin 81mg  daily.   *If you need a refill on your cardiac medications before your next appointment, please call your pharmacy*   Testing/Procedures: Dr. has ordered a Myocardial Perfusion Imaging Study.   The test will take approximately 3 to 4 hours to complete; you may bring reading material.  If someone comes with you to your appointment, they will need to remain in the main lobby due to limited space in the testing area.  You will need to hold the following medications prior to your stress test: beta-blockers (24 hours prior to test)   How to prepare for your Myocardial Perfusion Test: Do not eat or drink 3 hours prior to your test, except you may have water. Do not consume products containing caffeine (regular or decaffeinated) 12 hours prior to your test. (ex: coffee, chocolate, sodas, tea). Do wear comfortable clothes (no dresses or overalls) and walking shoes, tennis shoes preferred (No heels or open toe shoes are allowed). Do NOT wear cologne, perfume, aftershave, or lotions (deodorant is allowed). If these instructions are not followed, your test will have to be rescheduled.  This procedure will  be done at [address]   Follow-Up: At [facility], you and your health needs are our priority.  As part of our continuing mission to provide you with exceptional heart care, we have created designated Provider Care Teams.  These Care Teams include your primary Cardiologist (physician) and Advanced Practice Providers (APPs -  Physician Assistants and Nurse Practitioners) who all work together to provide you with the care you need, when you need it.  We recommend signing up for the patient portal called "MyChart".  Sign up information is provided on this After Visit Summary.  MyChart is used to connect with patients for Virtual Visits (Telemedicine).  Patients are able to view lab/test results, encounter notes, upcoming appointments, etc.  Non-urgent messages can be sent to your provider as well.   To learn more about what you can do with MyChart, go to [facility].    Your next appointment:   6 month(s)  The format for your next appointment:   In Person  Provider:   [email or web address], MD   Signed, Carolan Clines, MD  06/01/2022 11:34 AM    [facility]

## 2022-06-01 NOTE — H&P (View-Only) (Signed)
Cardiology Office Note:    Date:  06/01/2022   ID:  Wayne Diaz, DOB 10/02/1961, MRN [id number]  PCP:  Parker, Caleb M, MD   [facility] Providers Cardiologist:  None     Referring MD: Parker, Caleb M, MD   Chief Complaint  Patient presents with   New Patient (Initial Visit)    History of Present Illness:    Wayne Diaz is a 61 y.o. male with a hx of asthma, DM2, GERD, smoker for 35 years, CAC 1079 involving LM and 3 vessel dx. He works out in the yard a lot. He denies chest pressure or SOB. He gets some dizziness. He is from [place], he may have had a stress test in the past. His mother had hx of heart disease. Mother's side had heart dx. PGF had heart disease. Father had emphysema. He's taking his stain. LDL 52 mg/dL  Past Medical History:  Diagnosis Date   ALLERGIC RHINITIS 05/05/2008   ASTHMA 05/05/2008   DIABETES MELLITUS, TYPE II 04/28/2008   GERD 05/05/2008   HYPERLIPIDEMIA 05/05/2008   HYPERSOMNIA 03/10/2009   SMOKER 03/10/2009    Past Surgical History:  Procedure Laterality Date   s/p left knee surgury     ACL repair   TONSILLECTOMY      Current Medications: Current Meds  Medication Sig   Ascorbic Acid (VITAMIN C) 500 MG CAPS Take 500 mg by mouth daily.   aspirin EC 81 MG tablet Take 1 tablet (81 mg total) by mouth daily. Swallow whole.   atorvastatin (LIPITOR) 40 MG tablet TAKE 1 TABLET BY MOUTH EVERY DAY   dapagliflozin propanediol (FARXIGA) 10 MG TABS tablet Take 1 tablet (10 mg total) by mouth daily before breakfast.   Dulaglutide (TRULICITY) 0.75 MG/0.5ML SOPN Inject 0.75 mg into the skin once a week.   Lancet Devices (ONE TOUCH DELICA LANCING DEV) MISC Used to check blood sugars.   metFORMIN (GLUCOPHAGE-XR) 500 MG 24 hr tablet Take 4 tablets (2,000 mg total) by mouth daily. (Patient taking differently: Take 1,000 mg by mouth 2 (two) times daily.)   Multiple Vitamins-Minerals (CENTRUM ADULTS PO) Take by mouth.   Omega-3 Fatty Acids (FISH OIL PO) Take  2,400 mg by mouth daily.   pioglitazone (ACTOS) 30 MG tablet Take 1 tablet (30 mg total) by mouth daily.   repaglinide (PRANDIN) 2 MG tablet Take 1 tablet (2 mg total) by mouth 3 (three) times daily before meals. (Patient taking differently: Take 2 mg by mouth 2 (two) times daily before a meal.)   vardenafil (LEVITRA) 20 MG tablet TAKE 1 TABLET BY MOUTH EVERY DAY AS NEEDED FOR ERECTILE DYSFUNCTION   [DISCONTINUED] glucose-Vitamin C 4-0.006 GM CHEW chewable tablet Chew 1 tablet by mouth.   [DISCONTINUED] ondansetron (ZOFRAN-ODT) 4 MG disintegrating tablet Take 1 tablet (4 mg total) by mouth every 8 (eight) hours as needed for nausea or vomiting. SUBLINGUAL   [DISCONTINUED] pantoprazole (PROTONIX) 20 MG tablet TAKE 1 TABLET BY MOUTH TWICE A DAY     Allergies:   Patient has no known allergies.   Social History   Socioeconomic History   Marital status: Married    Spouse name: Not on file   Number of children: 4   Years of education: 16   Highest education level: Not on file  Occupational History   Occupation: banking business  Tobacco Use   Smoking status: Every Day    Packs/day: 1.00    Years: 35.00    Total pack years:   35.00    Types: Cigarettes   Smokeless tobacco: Never  Substance and Sexual Activity   Alcohol use: No   Drug use: No   Sexual activity: Not on file  Other Topics Concern   Not on file  Social History Narrative   Born and raised in [place], [place]. Raised [place]. Currently resides in house with his wife and daughter. Fun: Yard work, baseball.   Denies religious beliefs effecting health care.    Social Determinants of Health   Financial Resource Strain: Not on file  Food Insecurity: Not on file  Transportation Needs: Not on file  Physical Activity: Not on file  Stress: Not on file  Social Connections: Not on file     Family History: The patient's family history includes Arthritis in an other family member; Diabetes in an other family member;  Heart disease in an other family member; Hyperlipidemia in an other family member; Hypertension in an other family member.  ROS:   Please see the history of present illness.     All other systems reviewed and are negative.  EKGs/Labs/Other Studies Reviewed:    The following studies were reviewed today:   EKG:  EKG is  ordered today.  The ekg ordered today demonstrates   7/28- NSR  Recent Labs: 03/23/2022: ALT 17; BUN 16; Creat 0.74; Hemoglobin 15.5; Platelets 172; Potassium 4.8; Sodium 140; TSH 0.63   Recent Lipid Panel    Component Value Date/Time   CHOL 104 03/23/2022 1506   TRIG 104 03/23/2022 1506   HDL 33 (L) 03/23/2022 1506   CHOLHDL 3.2 03/23/2022 1506   VLDL 60.2 (H) 03/08/2015 0841   LDLCALC 52 03/23/2022 1506   LDLDIRECT 79.0 03/08/2015 0841     Risk Assessment/Calculations:           Physical Exam:    VS:  BP 124/70 (BP Location: Right Arm, Patient Position: Sitting, Cuff Size: Normal)   Pulse 92   Ht 5\' 9"  (1.753 Diaz)   Wt 162 lb (73.5 kg)   BMI 23.92 kg/Diaz     Wt Readings from Last 3 Encounters:  06/01/22 162 lb (73.5 kg)  03/23/22 156 lb 9.6 oz (71 kg)  03/23/22 150 lb (68 kg)     GEN:  Well nourished, well developed in no acute distress HEENT: Normal NECK: No JVD; No carotid bruits LYMPHATICS: No lymphadenopathy CARDIAC: RRR, no murmurs, rubs, gallops RESPIRATORY:  Clear to auscultation without rales, wheezing or rhonchi  ABDOMEN: Soft, non-tender, non-distended MUSCULOSKELETAL:  No edema; No deformity  SKIN: Warm and dry NEUROLOGIC:  Alert and oriented x 3 PSYCHIATRIC:  Normal affect   ASSESSMENT:   High CAC: 1097 . He is asymptomatic. His CAC is severe and involving the LM. Will plan for an exercise SPECT. He is at high risk. Low threshold for LHC. LDL goal < 70 mg/dL. He is at goal. Continue lipitor 40 mg daily.  DM2: recommend A1c < 7. On metformin.   Smoker: consider wellbutrin for smoking cessation. We discussed the importance of  quitting extensively   PLAN:    In order of problems listed above:  Start ASA 81 mg daily Exercise SPECT  Consider wellbutrin per PCP for smoking cessation Follow up 6 months         Medication Adjustments/Labs and Tests Ordered: Current medicines are reviewed at length with the patient today.  Concerns regarding medicines are outlined above.  Orders Placed This Encounter  Procedures   Cardiac Stress Test: Informed Consent  Details: Physician/Practitioner Attestation; Transcribe to consent form and obtain patient signature   MYOCARDIAL PERFUSION IMAGING   EKG 12-Lead   Meds ordered this encounter  Medications   aspirin EC 81 MG tablet    Sig: Take 1 tablet (81 mg total) by mouth daily. Swallow whole.    Patient Instructions  Medication Instructions:   -Start aspirin 81mg  daily.   *If you need a refill on your cardiac medications before your next appointment, please call your pharmacy*   Testing/Procedures: Dr. has ordered a Myocardial Perfusion Imaging Study.   The test will take approximately 3 to 4 hours to complete; you may bring reading material.  If someone comes with you to your appointment, they will need to remain in the main lobby due to limited space in the testing area.  You will need to hold the following medications prior to your stress test: beta-blockers (24 hours prior to test)   How to prepare for your Myocardial Perfusion Test: Do not eat or drink 3 hours prior to your test, except you may have water. Do not consume products containing caffeine (regular or decaffeinated) 12 hours prior to your test. (ex: coffee, chocolate, sodas, tea). Do wear comfortable clothes (no dresses or overalls) and walking shoes, tennis shoes preferred (No heels or open toe shoes are allowed). Do NOT wear cologne, perfume, aftershave, or lotions (deodorant is allowed). If these instructions are not followed, your test will have to be rescheduled.  This procedure will  be done at [address]   Follow-Up: At [facility], you and your health needs are our priority.  As part of our continuing mission to provide you with exceptional heart care, we have created designated Provider Care Teams.  These Care Teams include your primary Cardiologist (physician) and Advanced Practice Providers (APPs -  Physician Assistants and Nurse Practitioners) who all work together to provide you with the care you need, when you need it.  We recommend signing up for the patient portal called "MyChart".  Sign up information is provided on this After Visit Summary.  MyChart is used to connect with patients for Virtual Visits (Telemedicine).  Patients are able to view lab/test results, encounter notes, upcoming appointments, etc.  Non-urgent messages can be sent to your provider as well.   To learn more about what you can do with MyChart, go to [facility].    Your next appointment:   6 month(s)  The format for your next appointment:   In Person  Provider:   [email or web address], MD   Signed, Carolan Clines, MD  06/01/2022 11:34 AM    [facility]

## 2022-06-01 NOTE — Patient Instructions (Addendum)
Medication Instructions:   -Start aspirin 81mg  daily.   *If you need a refill on your cardiac medications before your next appointment, please call your pharmacy*   Testing/Procedures: Dr. has ordered a Myocardial Perfusion Imaging Study.   The test will take approximately 3 to 4 hours to complete; you may bring reading material.  If someone comes with you to your appointment, they will need to remain in the main lobby due to limited space in the testing area.  You will need to hold the following medications prior to your stress test: beta-blockers (24 hours prior to test)   How to prepare for your Myocardial Perfusion Test: Do not eat or drink 3 hours prior to your test, except you may have water. Do not consume products containing caffeine (regular or decaffeinated) 12 hours prior to your test. (ex: coffee, chocolate, sodas, tea). Do wear comfortable clothes (no dresses or overalls) and walking shoes, tennis shoes preferred (No heels or open toe shoes are allowed). Do NOT wear cologne, perfume, aftershave, or lotions (deodorant is allowed). If these instructions are not followed, your test will have to be rescheduled.  This procedure will be done at [address]   Follow-Up: At [facility], you and your health needs are our priority.  As part of our continuing mission to provide you with exceptional heart care, we have created designated Provider Care Teams.  These Care Teams include your primary Cardiologist (physician) and Advanced Practice Providers (APPs -  Physician Assistants and Nurse Practitioners) who all work together to provide you with the care you need, when you need it.  We recommend signing up for the patient portal called "MyChart".  Sign up information is provided on this After Visit Summary.  MyChart is used to connect with patients for Virtual Visits (Telemedicine).  Patients are able to view lab/test results, encounter notes, upcoming  appointments, etc.  Non-urgent messages can be sent to your provider as well.   To learn more about what you can do with MyChart, go to [facility].    Your next appointment:   6 month(s)  The format for your next appointment:   In Person  Provider:   [email or web address], MD

## 2022-06-05 ENCOUNTER — Telehealth (HOSPITAL_COMMUNITY): Payer: Self-pay | Admitting: Radiology

## 2022-06-05 NOTE — Telephone Encounter (Signed)
Patient given detailed instructions per Myocardial Perfusion Study Information Sheet for the test on 8/7 at 8:00. Patient notified to arrive 15 minutes early and that it is imperative to arrive on time for appointment to keep from having the test rescheduled.  If you need to cancel or reschedule your appointment, please call the office within 24 hours of your appointment. . Patient verbalized understanding.EHK

## 2022-06-11 ENCOUNTER — Ambulatory Visit (HOSPITAL_COMMUNITY): Payer: BC Managed Care – PPO | Attending: Internal Medicine

## 2022-06-11 ENCOUNTER — Other Ambulatory Visit: Payer: Self-pay

## 2022-06-11 DIAGNOSIS — Z01812 Encounter for preprocedural laboratory examination: Secondary | ICD-10-CM

## 2022-06-11 DIAGNOSIS — R931 Abnormal findings on diagnostic imaging of heart and coronary circulation: Secondary | ICD-10-CM | POA: Diagnosis not present

## 2022-06-11 DIAGNOSIS — E785 Hyperlipidemia, unspecified: Secondary | ICD-10-CM | POA: Diagnosis not present

## 2022-06-11 LAB — MYOCARDIAL PERFUSION IMAGING
LV dias vol: 111 mL (ref 62–150)
LV sys vol: 63 mL
Nuc Stress EF: 44 %
Rest Nuclear Isotope Dose: 9.9 mCi
SDS: 0
SRS: 0
SSS: 0
ST Depression (mm): 0 mm
Stress Nuclear Isotope Dose: 30.2 mCi
TID: 1.05

## 2022-06-11 MED ORDER — TECHNETIUM TC 99M TETROFOSMIN IV KIT
30.3000 | PACK | Freq: Once | INTRAVENOUS | Status: AC | PRN
  Administered 2022-06-11: 30.3 via INTRAVENOUS

## 2022-06-11 MED ORDER — TECHNETIUM TC 99M TETROFOSMIN IV KIT
9.9000 | PACK | Freq: Once | INTRAVENOUS | Status: AC | PRN
  Administered 2022-06-11: 9.9 via INTRAVENOUS

## 2022-06-11 NOTE — Telephone Encounter (Signed)
Discussed findings of [name] lexiscan nuclear stress and we discussed LHC. LV appeared dilated and LV function is reduced. He had considerable CAC.  Shared Decision Making/Informed Consent The risks [stroke (1 in 1000), death (1 in 1000), kidney failure [usually temporary] (1 in 500), bleeding (1 in 200), allergic reaction [possibly serious] (1 in 200)], benefits (diagnostic support and management of coronary artery disease) and alternatives of a cardiac catheterization were discussed in detail with [name] and he is willing to proceed.

## 2022-06-11 NOTE — Progress Notes (Signed)
Lab orders for [facility] 06/15/22

## 2022-06-12 ENCOUNTER — Encounter: Payer: Self-pay | Admitting: Internal Medicine

## 2022-06-12 ENCOUNTER — Other Ambulatory Visit: Payer: Self-pay

## 2022-06-12 DIAGNOSIS — Z01812 Encounter for preprocedural laboratory examination: Secondary | ICD-10-CM

## 2022-06-13 LAB — CBC
Hematocrit: 47.8 % (ref 37.5–51.0)
Hemoglobin: 15.8 g/dL (ref 13.0–17.7)
MCH: 30.5 pg (ref 26.6–33.0)
MCHC: 33.1 g/dL (ref 31.5–35.7)
MCV: 92 fL (ref 79–97)
Platelets: 146 10*3/uL — ABNORMAL LOW (ref 150–450)
RBC: 5.18 x10E6/uL (ref 4.14–5.80)
RDW: 12.4 % (ref 11.6–15.4)
WBC: 8.9 10*3/uL (ref 3.4–10.8)

## 2022-06-13 LAB — BASIC METABOLIC PANEL
BUN/Creatinine Ratio: 24 (ref 10–24)
BUN: 19 mg/dL (ref 8–27)
CO2: 25 mmol/L (ref 20–29)
Calcium: 9.9 mg/dL (ref 8.6–10.2)
Chloride: 99 mmol/L (ref 96–106)
Creatinine, Ser: 0.78 mg/dL (ref 0.76–1.27)
Glucose: 208 mg/dL — ABNORMAL HIGH (ref 70–99)
Potassium: 4.5 mmol/L (ref 3.5–5.2)
Sodium: 139 mmol/L (ref 134–144)
eGFR: 101 mL/min/{1.73_m2} (ref >59)

## 2022-06-15 ENCOUNTER — Ambulatory Visit (HOSPITAL_COMMUNITY)
Admission: RE | Admit: 2022-06-15 | Discharge: 2022-06-15 | Disposition: A | Discharge status: Home / Self Care | Payer: BC Managed Care – PPO | Attending: Cardiology | Admitting: Cardiology

## 2022-06-15 ENCOUNTER — Encounter: Payer: Self-pay | Admitting: Family Medicine

## 2022-06-15 ENCOUNTER — Other Ambulatory Visit: Payer: Self-pay

## 2022-06-15 ENCOUNTER — Encounter (HOSPITAL_COMMUNITY): Payer: Self-pay | Admitting: Cardiology

## 2022-06-15 ENCOUNTER — Encounter (HOSPITAL_COMMUNITY)
Admission: RE | Disposition: A | Discharge status: Home / Self Care | Payer: Self-pay | Source: Home / Self Care | Attending: Cardiology

## 2022-06-15 DIAGNOSIS — K219 Gastro-esophageal reflux disease without esophagitis: Secondary | ICD-10-CM | POA: Insufficient documentation

## 2022-06-15 DIAGNOSIS — I2584 Coronary atherosclerosis due to calcified coronary lesion: Secondary | ICD-10-CM | POA: Insufficient documentation

## 2022-06-15 DIAGNOSIS — E119 Type 2 diabetes mellitus without complications: Secondary | ICD-10-CM | POA: Insufficient documentation

## 2022-06-15 DIAGNOSIS — R9439 Abnormal result of other cardiovascular function study: Secondary | ICD-10-CM

## 2022-06-15 DIAGNOSIS — Z7984 Long term (current) use of oral hypoglycemic drugs: Secondary | ICD-10-CM | POA: Insufficient documentation

## 2022-06-15 DIAGNOSIS — Z7985 Long-term (current) use of injectable non-insulin antidiabetic drugs: Secondary | ICD-10-CM | POA: Insufficient documentation

## 2022-06-15 DIAGNOSIS — F17210 Nicotine dependence, cigarettes, uncomplicated: Secondary | ICD-10-CM | POA: Diagnosis not present

## 2022-06-15 DIAGNOSIS — I2510 Atherosclerotic heart disease of native coronary artery without angina pectoris: Secondary | ICD-10-CM | POA: Diagnosis not present

## 2022-06-15 HISTORY — PX: LEFT HEART CATH AND CORONARY ANGIOGRAPHY: CATH118249

## 2022-06-15 LAB — GLUCOSE, CAPILLARY: Glucose-Capillary: 151 mg/dL — ABNORMAL HIGH (ref 70–99)

## 2022-06-15 SURGERY — LEFT HEART CATH AND CORONARY ANGIOGRAPHY
Anesthesia: LOCAL

## 2022-06-15 MED ORDER — METFORMIN HCL ER 500 MG PO TB24: 1000.0000 mg | ORAL_TABLET | Freq: Two times a day (BID) | ORAL | Status: DC

## 2022-06-15 MED ORDER — HEPARIN (PORCINE) IN NACL 1000-0.9 UT/500ML-% IV SOLN
INTRAVENOUS | Status: DC | PRN
  Administered 2022-06-15 (×2): 500 mL

## 2022-06-15 MED ORDER — HEPARIN (PORCINE) IN NACL 1000-0.9 UT/500ML-% IV SOLN
INTRAVENOUS | Status: AC
  Filled 2022-06-15: qty 1000

## 2022-06-15 MED ORDER — SODIUM CHLORIDE 0.9% FLUSH: 3.0000 mL | Freq: Two times a day (BID) | INTRAVENOUS | Status: DC

## 2022-06-15 MED ORDER — SODIUM CHLORIDE 0.9 % IV SOLN: 250.0000 mL | INTRAVENOUS | Status: DC | PRN

## 2022-06-15 MED ORDER — LIDOCAINE HCL (PF) 1 % IJ SOLN
INTRAMUSCULAR | Status: DC | PRN
  Administered 2022-06-15: 2 mL via INTRADERMAL

## 2022-06-15 MED ORDER — FENTANYL CITRATE (PF) 100 MCG/2ML IJ SOLN
INTRAMUSCULAR | Status: AC
  Filled 2022-06-15: qty 2

## 2022-06-15 MED ORDER — LIDOCAINE HCL (PF) 1 % IJ SOLN
INTRAMUSCULAR | Status: AC
  Filled 2022-06-15: qty 30

## 2022-06-15 MED ORDER — SODIUM CHLORIDE 0.9 % WEIGHT BASED INFUSION: 1.0000 mL/kg/h | INTRAVENOUS | Status: DC

## 2022-06-15 MED ORDER — SODIUM CHLORIDE 0.9% FLUSH: 3.0000 mL | INTRAVENOUS | Status: DC | PRN

## 2022-06-15 MED ORDER — SODIUM CHLORIDE 0.9 % IV SOLN: INTRAVENOUS | Status: DC

## 2022-06-15 MED ORDER — VERAPAMIL HCL 2.5 MG/ML IV SOLN
INTRAVENOUS | Status: AC
  Filled 2022-06-15: qty 2

## 2022-06-15 MED ORDER — IOHEXOL 350 MG/ML SOLN
INTRAVENOUS | Status: DC | PRN
  Administered 2022-06-15: 45 mL

## 2022-06-15 MED ORDER — ASPIRIN 81 MG PO CHEW: 81.0000 mg | CHEWABLE_TABLET | ORAL | Status: DC

## 2022-06-15 MED ORDER — MIDAZOLAM HCL 2 MG/2ML IJ SOLN
INTRAMUSCULAR | Status: DC | PRN
  Administered 2022-06-15: 1 mg via INTRAVENOUS

## 2022-06-15 MED ORDER — ACETAMINOPHEN 325 MG PO TABS: 650.0000 mg | ORAL_TABLET | ORAL | Status: DC | PRN

## 2022-06-15 MED ORDER — HEPARIN SODIUM (PORCINE) 1000 UNIT/ML IJ SOLN
INTRAMUSCULAR | Status: DC | PRN
  Administered 2022-06-15: 4000 [IU] via INTRAVENOUS

## 2022-06-15 MED ORDER — HEPARIN SODIUM (PORCINE) 1000 UNIT/ML IJ SOLN
INTRAMUSCULAR | Status: AC
  Filled 2022-06-15: qty 10

## 2022-06-15 MED ORDER — VERAPAMIL HCL 2.5 MG/ML IV SOLN
INTRAVENOUS | Status: DC | PRN
  Administered 2022-06-15: 10 mL via INTRA_ARTERIAL

## 2022-06-15 MED ORDER — ONDANSETRON HCL 4 MG/2ML IJ SOLN: 4.0000 mg | Freq: Four times a day (QID) | INTRAMUSCULAR | Status: DC | PRN

## 2022-06-15 MED ORDER — FENTANYL CITRATE (PF) 100 MCG/2ML IJ SOLN
INTRAMUSCULAR | Status: DC | PRN
  Administered 2022-06-15: 25 ug via INTRAVENOUS

## 2022-06-15 MED ORDER — MIDAZOLAM HCL 2 MG/2ML IJ SOLN
INTRAMUSCULAR | Status: AC
  Filled 2022-06-15: qty 2

## 2022-06-15 SURGICAL SUPPLY — 10 items
BAND CMPR LRG ZPHR (HEMOSTASIS) ×1
BAND ZEPHYR COMPRESS 30 LONG (HEMOSTASIS) ×1 IMPLANT
CATH 5FR JL3.5 JR4 ANG PIG MP (CATHETERS) ×1 IMPLANT
GLIDESHEATH SLEND SS 6F .021 (SHEATH) ×1 IMPLANT
GUIDEWIRE INQWIRE 1.5J.035X260 (WIRE) IMPLANT
INQWIRE 1.5J .035X260CM (WIRE) ×2
KIT HEART LEFT (KITS) ×3 IMPLANT
PACK CARDIAC CATHETERIZATION (CUSTOM PROCEDURE TRAY) ×3 IMPLANT
TRANSDUCER W/STOPCOCK (MISCELLANEOUS) ×3 IMPLANT
TUBING CIL FLEX 10 FLL-RA (TUBING) ×3 IMPLANT

## 2022-06-15 NOTE — Interval H&P Note (Signed)
History and Physical Interval Note:  06/15/2022 9:00 AM  Wayne Diaz  has presented today for surgery, with the diagnosis of abnormal stress test.  The various methods of treatment have been discussed with the patient and family. After consideration of risks, benefits and other options for treatment, the patient has consented to  Procedure(s): LEFT HEART CATH AND CORONARY ANGIOGRAPHY (N/A) as a surgical intervention.  The patient's history has been reviewed, patient examined, no change in status, stable for surgery.  I have reviewed the patient's chart and labs.  Questions were answered to the patient's satisfaction.   Cath Lab Visit (complete for each Cath Lab visit)  Clinical Evaluation Leading to the Procedure:   ACS: No.  Non-ACS:    Anginal Classification: No Symptoms  Anti-ischemic medical therapy: No Therapy  Non-Invasive Test Results: Intermediate-risk stress test findings: cardiac mortality 1-3%/year  Prior CABG: No previous CABG        [name] [facility] 06/15/2022 9:00 AM

## 2022-06-20 NOTE — Telephone Encounter (Signed)
Ok to send in wellbutrin 150mg  daily. Would like for him to follow up with in a few weeks.  [place]. Katina Degree, MD 06/20/2022 11:00 AM

## 2022-06-28 ENCOUNTER — Other Ambulatory Visit: Payer: Self-pay | Admitting: *Deleted

## 2022-06-28 MED ORDER — BUPROPION HCL ER (XL) 150 MG PO TB24: 150.0000 mg | ORAL_TABLET | Freq: Every day | ORAL | 0 refills | Status: DC

## 2022-07-20 ENCOUNTER — Encounter: Payer: Self-pay | Admitting: Nurse Practitioner

## 2022-07-20 ENCOUNTER — Ambulatory Visit: Payer: BC Managed Care – PPO | Attending: Nurse Practitioner | Admitting: Nurse Practitioner

## 2022-07-20 VITALS — BP 116/73 | HR 87 | Resp 20 | Ht 69.0 in | Wt 162.8 lb

## 2022-07-20 DIAGNOSIS — Z720 Tobacco use: Secondary | ICD-10-CM

## 2022-07-20 DIAGNOSIS — E785 Hyperlipidemia, unspecified: Secondary | ICD-10-CM

## 2022-07-20 DIAGNOSIS — I2510 Atherosclerotic heart disease of native coronary artery without angina pectoris: Secondary | ICD-10-CM | POA: Diagnosis not present

## 2022-07-20 DIAGNOSIS — Z794 Long term (current) use of insulin: Secondary | ICD-10-CM

## 2022-07-20 DIAGNOSIS — E119 Type 2 diabetes mellitus without complications: Secondary | ICD-10-CM

## 2022-07-20 NOTE — Patient Instructions (Signed)
Medication Instructions:  Your physician recommends that you continue on your current medications as directed. Please refer to the Current Medication list given to you today.   *If you need a refill on your cardiac medications before your next appointment, please call your pharmacy*   Lab Work: NONE ordered at this time of appointment   If you have labs (blood work) drawn today and your tests are completely normal, you will receive your results only by: MyChart Message (if you have MyChart) OR A paper copy in the mail If you have any lab test that is abnormal or we need to change your treatment, we will call you to review the results.   Testing/Procedures: NONE ordered at this time of appointment     Follow-Up: At [facility], you and your health needs are our priority.  As part of our continuing mission to provide you with exceptional heart care, we have created designated Provider Care Teams.  These Care Teams include your primary Cardiologist (physician) and Advanced Practice Providers (APPs -  Physician Assistants and Nurse Practitioners) who all work together to provide you with the care you need, when you need it.  We recommend signing up for the patient portal called "MyChart".  Sign up information is provided on this After Visit Summary.  MyChart is used to connect with patients for Virtual Visits (Telemedicine).  Patients are able to view lab/test results, encounter notes, upcoming appointments, etc.  Non-urgent messages can be sent to your provider as well.   To learn more about what you can do with MyChart, go to [email or web address].    Your next appointment:   6 month(s)  The format for your next appointment:   In Person  Provider:   None     Other Instructions   Important Information About Sugar       

## 2022-07-20 NOTE — Progress Notes (Signed)
Office Visit    Patient Name: Wayne Diaz Date of Encounter: 07/20/2022  Primary Care Provider:  Ardith Dark, MD Primary Cardiologist:  Maisie Fus, MD  Chief Complaint    61 year old male with a history of CAD, hyperlipidemia, type 2 diabetes, GERD, asthma, and tobacco use who presents for follow-up related to CAD.   Past Medical History    Past Medical History:  Diagnosis Date   ALLERGIC RHINITIS 05/05/2008   ASTHMA 05/05/2008   DIABETES MELLITUS, TYPE II 04/28/2008   GERD 05/05/2008   HYPERLIPIDEMIA 05/05/2008   HYPERSOMNIA 03/10/2009   SMOKER 03/10/2009   Past Surgical History:  Procedure Laterality Date   LEFT HEART CATH AND CORONARY ANGIOGRAPHY N/A 06/15/2022   Procedure: LEFT HEART CATH AND CORONARY ANGIOGRAPHY;  Surgeon: Swaziland, Peter M, MD;  Location: [facility] INVASIVE CV LAB;  Service: Cardiovascular;  Laterality: N/A;   s/p left knee surgury     ACL repair   TONSILLECTOMY      Allergies  No Known Allergies  History of Present Illness    61 year old male with the above past medical history including CAD, hyperlipidemia, type 2 diabetes, GERD, asthma, and tobacco use.   He was initially referred to Dr. Wyline Mood in July 2023 in the setting of elevated coronary calcium score in 08/2020 was 1079 (97th percentile), family history of CAD. He was last seen in the office on 06/01/2022 and was stable from a cardiac standpoint.  He denies symptoms concerning for angina.  However, given elevated risk, he underwent a exercise Myoview in 06/2022, which showed evidence of prior myocardial infarction with peri-infarct ischemia, EF 44%, PVCs, overall moderate risk.  He was referred for cardiac catheterization which revealed pLAD 30% mLAD 30%, o-pLCx25%, and p-mRCA 20% stenoses, EF 55-65%.  Risk factor modification was recommended for nonobstructive CAD.  He presents today for follow-up.  Since his procedure he has done well from a cardiac standpoint.  He is working full-time without  difficulty.  He denies any symptoms concerning for angina.  His right radial cath site has healed well. He is still smoking.  His PCP prescribed him Wellbutrin however, he has not started taking this medication at this time.  He plans to start soon.  Otherwise, he reports feeling well and denies any additional concerns today.  Home Medications    Current Outpatient Medications  Medication Sig Dispense Refill   Ascorbic Acid (VITAMIN C) 500 MG CAPS Take 500 mg by mouth at bedtime.     aspirin EC 81 MG tablet Take 1 tablet (81 mg total) by mouth daily. Swallow whole.     atorvastatin (LIPITOR) 40 MG tablet TAKE 1 TABLET BY MOUTH EVERY DAY 90 tablet 3   dapagliflozin propanediol (FARXIGA) 10 MG TABS tablet Take 1 tablet (10 mg total) by mouth daily before breakfast. 90 tablet 3   Dulaglutide (TRULICITY) 0.75 MG/0.5ML SOPN Inject 0.75 mg into the skin once a week. (Patient taking differently: Inject 0.75 mg into the skin every Saturday.) 2 mL 2   Lancet Devices (ONE TOUCH DELICA LANCING DEV) MISC Used to check blood sugars. 1 each 0   metFORMIN (GLUCOPHAGE-XR) 500 MG 24 hr tablet Take 2 tablets (1,000 mg total) by mouth 2 (two) times daily.     Multiple Vitamins-Minerals (CENTRUM ADULTS PO) Take 1 Application by mouth at bedtime. Centrum Silver Men 50+     Omega-3 Fatty Acids (FISH OIL PO) Take 2 capsules by mouth at bedtime.     pioglitazone (ACTOS)  30 MG tablet Take 1 tablet (30 mg total) by mouth daily. 90 tablet 3   repaglinide (PRANDIN) 2 MG tablet Take 1 tablet (2 mg total) by mouth 3 (three) times daily before meals. (Patient taking differently: Take 2 mg by mouth 2 (two) times daily before a meal.) 270 tablet 3   tolnaftate (TINACTIN) 1 % spray Apply 1 spray topically 2 (two) times daily as needed (foot irritation/itching (fungus)).     vardenafil (LEVITRA) 20 MG tablet TAKE 1 TABLET BY MOUTH EVERY DAY AS NEEDED FOR ERECTILE DYSFUNCTION 6 tablet 2   No current facility-administered  medications for this visit.     Review of Systems    He denies chest pain, palpitations, dyspnea, pnd, orthopnea, n, v, dizziness, syncope, edema, weight gain, or early satiety. All other systems reviewed and are otherwise negative except as noted above.   Physical Exam    VS:  BP 116/73   Pulse 87   Resp 20   Ht 5\' 9"  (1.753 m)   Wt 162 lb 12.8 oz (73.8 kg)   SpO2 96%   BMI 24.04 kg/m   GEN: Well nourished, well developed, in no acute distress. HEENT: normal. Neck: Supple, no JVD, carotid bruits, or masses. Cardiac: RRR, no murmurs, rubs, or gallops. No clubbing, cyanosis, edema.  Radials/DP/PT 2+ and equal bilaterally.  Right radial cath site without bruising, bleeding, or hematoma. Respiratory:  Respirations regular and unlabored, clear to auscultation bilaterally. GI: Soft, nontender, nondistended, BS + x 4. MS: no deformity or atrophy. Skin: warm and dry, no rash. Neuro:  Strength and sensation are intact. Psych: Normal affect.  Accessory Clinical Findings    ECG personally reviewed by me today -sinus rhythm, 87 bpm, occasional PVCs- no acute changes.   Lab Results  Component Value Date   WBC 8.9 06/12/2022   HGB 15.8 06/12/2022   HCT 47.8 06/12/2022   MCV 92 06/12/2022   PLT 146 (L) 06/12/2022   Lab Results  Component Value Date   CREATININE 0.78 06/12/2022   BUN 19 06/12/2022   NA 139 06/12/2022   K 4.5 06/12/2022   CL 99 06/12/2022   CO2 25 06/12/2022   Lab Results  Component Value Date   ALT 17 03/23/2022   AST 14 03/23/2022   ALKPHOS 50 03/08/2015   BILITOT 0.6 03/23/2022   Lab Results  Component Value Date   CHOL 104 03/23/2022   HDL 33 (L) 03/23/2022   LDLCALC 52 03/23/2022   LDLDIRECT 79.0 03/08/2015   TRIG 104 03/23/2022   CHOLHDL 3.2 03/23/2022    Lab Results  Component Value Date   HGBA1C 6.8 (A) 03/23/2022    Assessment & Plan    1. CAD:  H/o elevated coronary calcium score in 08/2020 was 1079 (97th percentile). Myoview in  06/2022, which showed evidence of prior myocardial infarction with peri-infarct ischemia, EF 44%, PVCs, overall moderate riskCath revealed pLAD 30% mLAD 30%, o-pLCx25%, and p-mRCA 20% stenoses, EF 55-65%. Stable with no anginal symptoms. No indication for ischemic evaluation.  Continue aspirin, Lipitor.  2. Hyperlipidemia: LDL was 52 in 03/2022.  Continue Lipitor.  3. Type 2 Diabetes: A1C was 6.8 in 03/2022.  Monitored and managed per PCP.  4. Tobacco use: Recently prescribed Wellbutrin per his PCP not started taking this yet.  Full cessation advised.    5. Disposition: Follow-up in 6 months.      04/2022, NP 07/20/2022, 8:37 AM

## 2022-07-30 ENCOUNTER — Encounter: Payer: Self-pay | Admitting: *Deleted

## 2022-08-02 NOTE — Progress Notes (Unsigned)
Name: Wayne Diaz  Age/ Sex: 61 y.o., male   MRN/ DOB: [id number], [birth year]     PCP: Wayne Barrack, MD   Reason for Endocrinology Evaluation: Type 2 Diabetes Mellitus  Initial Endocrine Consultative Visit: 10/17/2015    PATIENT IDENTIFIER: [name] is a 61 y.o. male with a past medical history of T2DM, Dyslipidemia . The patient has followed with Endocrinology clinic since 10/17/2015 for consultative assistance with management of his diabetes.  DIABETIC HISTORY:  [name] was diagnosed with DM 2002. His hemoglobin A1c has ranged from 6.8% in 2023, peaking at 9.4% in 2015.   He has been on Trulicity in the past, started Ozempic which was stopped in May, 2023 due to chronic diarrhea and vomiting   Transitioned from Dr. Loanne Drilling 03/2022 SUBJECTIVE:   During the last visit (12/22/2021): A1c 6.8 %   Today (08/03/2022): [name]  is here for a follow up on diabetes management. He checks his blood sugars 1 times daily. The patient has not had hypoglycemic episodes since the last clinic visit.  Saw cardiology for a f/u 07/20/2022. He is S/P cardiac cath 06/2022  He denies nausea, vomiting or diarrhea  Has noted leg swelling while siting for prolonged period   HOME DIABETES REGIMEN:  Farxiga 10 mg daily  Metformin 500 mg XR, 2 tabs BID  tabs  Pioglitazone 30 mg daily  Repaglinide 2 mg , 1 tablet BID  Trulicity [id number] mg weekly      Statin: yes  ACE-I/ARB: no    METER DOWNLOAD SUMMARY: dud not bring      DIABETIC COMPLICATIONS: Microvascular complications:  Denies: CKD, retinopathy, neuropathy Last Eye Exam: scheduled 04/2022  Macrovascular complications:   Denies: CAD, CVA, PVD   HISTORY:  Past Medical History:  Past Medical History:  Diagnosis Date   ALLERGIC RHINITIS 05/05/2008   ASTHMA 05/05/2008   DIABETES MELLITUS, TYPE II 04/28/2008   GERD 05/05/2008   HYPERLIPIDEMIA 05/05/2008   HYPERSOMNIA 03/10/2009   SMOKER 03/10/2009   Past Surgical History:   Past Surgical History:  Procedure Laterality Date   LEFT HEART CATH AND CORONARY ANGIOGRAPHY N/A 06/15/2022   Procedure: LEFT HEART CATH AND CORONARY ANGIOGRAPHY;  Surgeon: Martinique, Peter M, MD;  Location: [facility];  Service: Cardiovascular;  Laterality: N/A;   s/p left knee surgury     ACL repair   TONSILLECTOMY     Social History:  reports that he has been smoking cigarettes. He has a 35.00 pack-year smoking history. He has never used smokeless tobacco. He reports that he does not drink alcohol and does not use drugs. Family History:  Family History  Problem Relation Age of Onset   Arthritis Other    Hyperlipidemia Other    Heart disease Other    Hypertension Other    Diabetes Other      HOME MEDICATIONS: Allergies as of 08/03/2022   No Known Allergies      Medication List        Accurate as of August 03, 2022  9:10 AM. If you have any questions, ask your nurse or doctor.          aspirin EC 81 MG tablet Take 1 tablet (81 mg total) by mouth daily. Swallow whole.   atorvastatin 40 MG tablet Commonly known as: LIPITOR TAKE 1 TABLET BY MOUTH EVERY DAY   CENTRUM ADULTS PO Take 1 Application by mouth at bedtime. Centrum Silver Men 50+   dapagliflozin propanediol 10 MG  Tabs tablet Commonly known as: Farxiga Take 1 tablet (10 mg total) by mouth daily before breakfast.   FISH OIL PO Take 2 capsules by mouth at bedtime.   metFORMIN 500 MG 24 hr tablet Commonly known as: GLUCOPHAGE-XR Take 2 tablets (1,000 mg total) by mouth 2 (two) times daily.   ONE TOUCH DELICA LANCING DEV Misc Used to check blood sugars.   pioglitazone 30 MG tablet Commonly known as: Actos Take 1 tablet (30 mg total) by mouth daily.   repaglinide 2 MG tablet Commonly known as: PRANDIN Take 1 tablet (2 mg total) by mouth 3 (three) times daily before meals. What changed: when to take this   tolnaftate 1 % spray Commonly known as: TINACTIN Apply 1 spray topically 2 (two)  times daily as needed (foot irritation/itching (fungus)).   Trulicity 0.63 KZ/6.0FU Sopn Generic drug: Dulaglutide Inject 0.75 mg into the skin once a week. What changed: when to take this   vardenafil 20 MG tablet Commonly known as: LEVITRA TAKE 1 TABLET BY MOUTH EVERY DAY AS NEEDED FOR ERECTILE DYSFUNCTION   Vitamin C 500 MG Caps Take 500 mg by mouth at bedtime.         OBJECTIVE:   Vital Signs: BP (!) 122/8 (BP Location: Left Arm, Patient Position: Sitting, Cuff Size: Small)   Pulse 91   Ht 5\' 9"  (1.753 m)   Wt 159 lb (72.1 kg)   SpO2 95%   BMI 23.48 kg/m   Wt Readings from Last 3 Encounters:  08/03/22 159 lb (72.1 kg)  07/20/22 162 lb 12.8 oz (73.8 kg)  06/15/22 157 lb (71.2 kg)     Exam: General: Pt appears well and is in NAD  Lungs: Clear with good BS bilat with no rales, rhonchi, or wheezes  Heart: RRR   Abdomen: soft, nontender, without masses  Extremities: No pretibial edema. No tremor. Normal strength and motion throughout. See detailed diabetic foot exam below.  Neuro: MS is good with appropriate affect, pt is alert and Ox3    DM foot exam: 08/03/2022  The skin of the feet is intact without sores or ulcerations. The pedal pulses are 2+ on right and 2+ on left. The sensation is intact to a screening 5.07, 10 gram monofilament bilaterally          DATA REVIEWED:  Lab Results  Component Value Date   HGBA1C 6.8 (A) 03/23/2022   HGBA1C 7.5 (A) 12/22/2021   HGBA1C 7.6 (A) 09/22/2021   Lab Results  Component Value Date   MICROALBUR 1.6 04/04/2016   LDLCALC 52 03/23/2022   CREATININE 0.78 06/12/2022   Lab Results  Component Value Date   MICRALBCREAT 1.0 04/04/2016     Lab Results  Component Value Date   CHOL 104 03/23/2022   HDL 33 (L) 03/23/2022   LDLCALC 52 03/23/2022   LDLDIRECT 79.0 03/08/2015   TRIG 104 03/23/2022   CHOLHDL 3.2 03/23/2022         ASSESSMENT / PLAN / RECOMMENDATIONS:   1) Type 2 Diabetes Mellitus, Sub  -Optimally controlled, With out complications - Most recent A1c of 7.8 %. Goal A1c < 7.0 %.    - A1c above goal  - Has noted swelling  , will reduce pioglitazone  - He is intolerant to  Ozempic but had tolerated Trulicity up  to 3 mg weekly in the past , currently on 0.75 mg, will increase to 1.5 mg  - Will reduce Pioglitazone due to LE edema  MEDICATIONS: Increase trulicity  1.5 mg weekly  Take repaglinide 2 mg  BID Continue Farxiga 10 mg daily Continue metformin 500 mg XR 2 tabs twice daily Decrease  Actos 15 mg daily  EDUCATION / INSTRUCTIONS: BG monitoring instructions: Patient is instructed to check his blood sugars 1 times a day, fasting. Call [facility] if: BG persistently < 70  I reviewed the Rule of 15 for the treatment of hypoglycemia in detail with the patient. Literature supplied.   2) Diabetic complications:  Eye: Does not have known diabetic retinopathy.  Neuro/ Feet: Does not have known diabetic peripheral neuropathy .  Renal: Patient does not have known baseline CKD.    F/U in 4 months   Signed electronically by: Mack Guise, MD  [facility]  [facility] [address] [place], [place] [address] Phone: [phone] FAX: [phone]   CC: Wayne Diaz, [place] [address] Phone: [phone]  Fax: [phone]  Return to Endocrinology clinic as below: No future appointments.

## 2022-08-03 ENCOUNTER — Ambulatory Visit: Payer: BC Managed Care – PPO | Admitting: Internal Medicine

## 2022-08-03 ENCOUNTER — Encounter: Payer: Self-pay | Admitting: Internal Medicine

## 2022-08-03 VITALS — BP 122/80 | HR 91 | Ht 69.0 in | Wt 159.0 lb

## 2022-08-03 DIAGNOSIS — E119 Type 2 diabetes mellitus without complications: Secondary | ICD-10-CM

## 2022-08-03 DIAGNOSIS — E1165 Type 2 diabetes mellitus with hyperglycemia: Secondary | ICD-10-CM

## 2022-08-03 LAB — POCT GLYCOSYLATED HEMOGLOBIN (HGB A1C): Hemoglobin A1C: 7.8 % — AB (ref 4.0–5.6)

## 2022-08-03 LAB — POCT GLUCOSE (DEVICE FOR HOME USE): Glucose Fasting, POC: 230 mg/dL — AB (ref 70–99)

## 2022-08-03 MED ORDER — PIOGLITAZONE HCL 15 MG PO TABS: 15.0000 mg | ORAL_TABLET | Freq: Every day | ORAL | 3 refills | Status: DC

## 2022-08-03 MED ORDER — DAPAGLIFLOZIN PROPANEDIOL 10 MG PO TABS
10.0000 mg | ORAL_TABLET | Freq: Every day | ORAL | 3 refills | Status: DC
Start: 2022-08-03 — End: 2023-02-08

## 2022-08-03 MED ORDER — TRULICITY 1.5 MG/0.5ML ~~LOC~~ SOAJ: 1.5000 mg | SUBCUTANEOUS | 3 refills | Status: DC

## 2022-08-03 MED ORDER — METFORMIN HCL ER 500 MG PO TB24
1000.0000 mg | ORAL_TABLET | Freq: Two times a day (BID) | ORAL | 3 refills | Status: DC
Start: 2022-08-03 — End: 2023-02-08

## 2022-08-03 NOTE — Patient Instructions (Signed)
-   Increase trulicity 3 mg weekly - Decrease Pioglitazone to 15 mg daily  - Take Repaglinide 2 mg , 1 tablet before Breakfast and Supper  - Continue  farxiga 10 mg , 1 tablet every morning  - Continue Metformin 500 mg XR 2 tablets twice a day      HOW TO TREAT LOW BLOOD SUGARS (Blood sugar LESS THAN 70 MG/DL) Please follow the RULE OF 15 for the treatment of hypoglycemia treatment (when your (blood sugars are less than 70 mg/dL)   STEP 1: Take 15 grams of carbohydrates when your blood sugar is low, which includes:  3-4 GLUCOSE TABS  OR 3-4 OZ OF JUICE OR REGULAR SODA OR ONE TUBE OF GLUCOSE GEL    STEP 2: RECHECK blood sugar in 15 MINUTES STEP 3: If your blood sugar is still low at the 15 minute recheck --> then, go back to STEP 1 and treat AGAIN with another 15 grams of carbohydrates.

## 2022-08-05 DIAGNOSIS — E1165 Type 2 diabetes mellitus with hyperglycemia: Secondary | ICD-10-CM | POA: Insufficient documentation

## 2022-08-08 ENCOUNTER — Other Ambulatory Visit: Payer: Self-pay | Admitting: Internal Medicine

## 2022-08-29 DIAGNOSIS — J0100 Acute maxillary sinusitis, unspecified: Secondary | ICD-10-CM | POA: Diagnosis not present

## 2022-09-06 ENCOUNTER — Other Ambulatory Visit: Payer: Self-pay | Admitting: Family Medicine

## 2022-09-06 DIAGNOSIS — N529 Male erectile dysfunction, unspecified: Secondary | ICD-10-CM

## 2022-10-18 ENCOUNTER — Encounter: Payer: Self-pay | Admitting: *Deleted

## 2022-10-29 ENCOUNTER — Other Ambulatory Visit: Payer: Self-pay | Admitting: Family Medicine

## 2022-12-25 DIAGNOSIS — R067 Sneezing: Secondary | ICD-10-CM | POA: Diagnosis not present

## 2022-12-25 DIAGNOSIS — R0981 Nasal congestion: Secondary | ICD-10-CM | POA: Diagnosis not present

## 2022-12-25 DIAGNOSIS — R519 Headache, unspecified: Secondary | ICD-10-CM | POA: Diagnosis not present

## 2022-12-25 DIAGNOSIS — J069 Acute upper respiratory infection, unspecified: Secondary | ICD-10-CM | POA: Diagnosis not present

## 2022-12-31 DIAGNOSIS — R070 Pain in throat: Secondary | ICD-10-CM | POA: Diagnosis not present

## 2022-12-31 DIAGNOSIS — J324 Chronic pansinusitis: Secondary | ICD-10-CM | POA: Diagnosis not present

## 2022-12-31 DIAGNOSIS — R051 Acute cough: Secondary | ICD-10-CM | POA: Diagnosis not present

## 2022-12-31 DIAGNOSIS — R0981 Nasal congestion: Secondary | ICD-10-CM | POA: Diagnosis not present

## 2023-01-11 ENCOUNTER — Other Ambulatory Visit: Payer: Self-pay | Admitting: Internal Medicine

## 2023-01-11 ENCOUNTER — Other Ambulatory Visit: Payer: Self-pay

## 2023-01-11 MED ORDER — TRULICITY 1.5 MG/0.5ML ~~LOC~~ SOAJ: 1.5000 mg | SUBCUTANEOUS | 3 refills | Status: DC

## 2023-01-11 NOTE — Telephone Encounter (Signed)
Spoke with pharmacy and they have prescription on file from 08/03/22

## 2023-01-19 ENCOUNTER — Other Ambulatory Visit: Payer: Self-pay | Admitting: Internal Medicine

## 2023-02-07 NOTE — Progress Notes (Signed)
Name: Wayne Diaz  Age/ Sex: 62 y.o., male   MRN/ DOB: [id number], 08/26/1961     PCP: Ardith Dark, MD   Reason for Endocrinology Evaluation: Type 2 Diabetes Mellitus  Initial Endocrine Consultative Visit: 10/17/2015    PATIENT IDENTIFIER: [name] is a 62 y.o. male with a past medical history of T2DM, Dyslipidemia . The patient has followed with Endocrinology clinic since 10/17/2015 for consultative assistance with management of his diabetes.  DIABETIC HISTORY:  [name] was diagnosed with DM 2002. His hemoglobin A1c has ranged from 6.8% in 2023, peaking at 9.4% in 2015.   He has been on Trulicity in the past, started Ozempic which was stopped in May, 2023 due to chronic diarrhea and vomiting   Transitioned from Dr. Everardo All 03/2022   Switched Trulicity to [facility] 01/2023 SUBJECTIVE:   During the last visit (08/03/2022): A1c 7.8 %   Today (02/08/2023): [name]  is here for a follow up on diabetes management. He checks his blood sugars 1-2  times daily. The patient has not had hypoglycemic episodes since the last clinic visit.  Saw cardiology for a f/u 07/20/2022. He is S/P cardiac cath 06/2022  He denies nausea, vomiting or diarrhea  with mounjaro   He does not take repaglinide with lunch  LE improved   HOME DIABETES REGIMEN:  Farxiga 10 mg daily  Metformin 500 mg XR, 2 tabs BID  tabs  Pioglitazone 15 mg daily  Repaglinide 2 mg , 1 tablet BID  Mounjaro 2.5 mg weekly      Statin: yes  ACE-I/ARB: no    METER DOWNLOAD SUMMARY: unable to download  This am 220 mg/dL  409 - 811  DIABETIC COMPLICATIONS: Microvascular complications:  Denies: CKD, retinopathy, neuropathy Last Eye Exam: scheduled 04/2022  Macrovascular complications:   Denies: CAD, CVA, PVD   HISTORY:  Past Medical History:  Past Medical History:  Diagnosis Date   ALLERGIC RHINITIS 05/05/2008   ASTHMA 05/05/2008   DIABETES MELLITUS, TYPE II 04/28/2008   GERD 05/05/2008    HYPERLIPIDEMIA 05/05/2008   HYPERSOMNIA 03/10/2009   SMOKER 03/10/2009   Past Surgical History:  Past Surgical History:  Procedure Laterality Date   LEFT HEART CATH AND CORONARY ANGIOGRAPHY N/A 06/15/2022   Procedure: LEFT HEART CATH AND CORONARY ANGIOGRAPHY;  Surgeon: Swaziland, Peter M, MD;  Location: [facility] INVASIVE CV LAB;  Service: Cardiovascular;  Laterality: N/A;   s/p left knee surgury     ACL repair   TONSILLECTOMY     Social History:  reports that he has been smoking cigarettes. He has a 35.00 pack-year smoking history. He has never used smokeless tobacco. He reports that he does not drink alcohol and does not use drugs. Family History:  Family History  Problem Relation Age of Onset   Arthritis Other    Hyperlipidemia Other    Heart disease Other    Hypertension Other    Diabetes Other      HOME MEDICATIONS: Allergies as of 02/08/2023   No Known Allergies      Medication List        Accurate as of February 08, 2023  8:53 AM. If you have any questions, ask your nurse or doctor.          aspirin EC 81 MG tablet Take 1 tablet (81 mg total) by mouth daily. Swallow whole.   atorvastatin 40 MG tablet Commonly known as: LIPITOR TAKE 1 TABLET BY MOUTH EVERY DAY   CENTRUM ADULTS  PO Take 1 Application by mouth at bedtime. Centrum Silver Men 50+   dapagliflozin propanediol 10 MG Tabs tablet Commonly known as: Farxiga Take 1 tablet (10 mg total) by mouth daily before breakfast.   FISH OIL PO Take 2 capsules by mouth at bedtime.   metFORMIN 500 MG 24 hr tablet Commonly known as: GLUCOPHAGE-XR Take 2 tablets (1,000 mg total) by mouth 2 (two) times daily.   ONE TOUCH DELICA LANCING DEV Misc Used to check blood sugars.   pioglitazone 15 MG tablet Commonly known as: Actos Take 1 tablet (15 mg total) by mouth daily.   repaglinide 2 MG tablet Commonly known as: PRANDIN Take 1 tablet (2 mg total) by mouth 3 (three) times daily before meals. What changed: when to take this    tirzepatide 2.5 MG/0.5ML Pen Commonly known as: MOUNJARO Inject 2.5 mg into the skin once a week.   tolnaftate 1 % spray Commonly known as: TINACTIN Apply 1 spray topically 2 (two) times daily as needed (foot irritation/itching (fungus)).   vardenafil 20 MG tablet Commonly known as: LEVITRA TAKE 1 TABLET BY MOUTH EVERY DAY AS NEEDED FOR ERECTILE DYSFUNCTION   Vitamin C 500 MG Caps Take 500 mg by mouth at bedtime.         OBJECTIVE:   Vital Signs: BP 120/60   Pulse (!) 101   Ht 5\' 9"  (1.753 m)   Wt 165 lb (74.8 kg)   SpO2 95%   BMI 24.37 kg/m   Wt Readings from Last 3 Encounters:  02/08/23 165 lb (74.8 kg)  08/03/22 159 lb (72.1 kg)  07/20/22 162 lb 12.8 oz (73.8 kg)     Exam: General: Pt appears well and is in NAD  Lungs: Clear with good BS bilat with no rales, rhonchi, or wheezes  Heart: RRR   Abdomen: soft, nontender, without masses  Extremities: No pretibial edema. No tremor. Normal strength and motion throughout. See detailed diabetic foot exam below.  Neuro: MS is good with appropriate affect, pt is alert and Ox3    DM foot exam:02/08/2023  The skin of the feet is intact without sores or ulcerations. The pedal pulses are 2+ on right and 2+ on left. The sensation is intact to a screening 5.07, 10 gram monofilament bilaterally          DATA REVIEWED:  Lab Results  Component Value Date   HGBA1C 7.8 (A) 02/08/2023   HGBA1C 7.8 (A) 08/03/2022   HGBA1C 6.8 (A) 03/23/2022    Latest Reference Range & Units 06/12/22 08:13  Sodium 134 - 144 mmol/L 139  Potassium 3.5 - 5.2 mmol/L 4.5  Chloride 96 - 106 mmol/L 99  CO2 20 - 29 mmol/L 25  Glucose 70 - 99 mg/dL 409208 (H)  BUN 8 - 27 mg/dL 19  Creatinine 8.110.76 - 9.141.27 mg/dL 7.820.78  Calcium 8.6 - 95.610.2 mg/dL 9.9  BUN/Creatinine Ratio 10 - 24  24  eGFR >59 mL/min/1.73 101  (H): Data is abnormally high  ASSESSMENT / PLAN / RECOMMENDATIONS:   1) Type 2 Diabetes Mellitus, Sub -Optimally controlled, With out  complications - Most recent A1c of 7.8 %. Goal A1c < 7.0 %.    - A1c above goal but stable  - He has not noted as much swelling with current dose of pioglitazone   - He is intolerant to  Ozempic but had tolerated Trulicity up  to 3 mg weekly in the past , currently on Mounjaro and tolerating well, will increase as  below  - He takes Repaglinide with breakfast and Supper only as he does not take it to work     MEDICATIONS: Increase Mounjaro 5 mg weekly  Take repaglinide 2 mg , 1 tab BID Continue Farxiga 10 mg daily Continue metformin 500 mg XR 2 tabs twice daily Continue  Actos 15 mg daily  EDUCATION / INSTRUCTIONS: BG monitoring instructions: Patient is instructed to check his blood sugars 1 times a day, fasting. Call [facility] [facility] if: BG persistently < 70  I reviewed the Rule of 15 for the treatment of hypoglycemia in detail with the patient. Literature supplied.   2) Diabetic complications:  Eye: Does not have known diabetic retinopathy.  Neuro/ Feet: Does not have known diabetic peripheral neuropathy .  Renal: Patient does not have known baseline CKD.    F/U in 6 months   Signed electronically by: Lyndle HerrlichAbby Jaralla Deirdra Heumann, MD  [facility]  [facility] [address] [place], [place] [address] Phone: [phone] FAX: [phone]   CC: Ardith Darkarker, Caleb M, MD [address] [place] [address] Phone: [phone]  Fax: [phone]  Return to Endocrinology clinic as below: No future appointments.

## 2023-02-08 ENCOUNTER — Ambulatory Visit (INDEPENDENT_AMBULATORY_CARE_PROVIDER_SITE_OTHER): Payer: BC Managed Care – PPO | Admitting: Internal Medicine

## 2023-02-08 ENCOUNTER — Other Ambulatory Visit: Payer: Self-pay | Admitting: Family Medicine

## 2023-02-08 ENCOUNTER — Encounter: Payer: Self-pay | Admitting: Internal Medicine

## 2023-02-08 VITALS — BP 120/60 | HR 101 | Ht 69.0 in | Wt 165.0 lb

## 2023-02-08 DIAGNOSIS — E1165 Type 2 diabetes mellitus with hyperglycemia: Secondary | ICD-10-CM | POA: Diagnosis not present

## 2023-02-08 DIAGNOSIS — N529 Male erectile dysfunction, unspecified: Secondary | ICD-10-CM

## 2023-02-08 LAB — POCT GLYCOSYLATED HEMOGLOBIN (HGB A1C): Hemoglobin A1C: 7.8 % — AB (ref 4.0–5.6)

## 2023-02-08 MED ORDER — PIOGLITAZONE HCL 15 MG PO TABS
15.0000 mg | ORAL_TABLET | Freq: Every day | ORAL | 3 refills | Status: AC
Start: 2023-02-08 — End: ?

## 2023-02-08 MED ORDER — TIRZEPATIDE 5 MG/0.5ML ~~LOC~~ SOAJ: 5.0000 mg | SUBCUTANEOUS | 3 refills | Status: DC

## 2023-02-08 MED ORDER — DAPAGLIFLOZIN PROPANEDIOL 10 MG PO TABS: 10.0000 mg | ORAL_TABLET | Freq: Every day | ORAL | 3 refills | Status: DC

## 2023-02-08 MED ORDER — REPAGLINIDE 2 MG PO TABS
2.0000 mg | ORAL_TABLET | Freq: Two times a day (BID) | ORAL | 3 refills | Status: AC
Start: 2023-02-08 — End: ?

## 2023-02-08 MED ORDER — METFORMIN HCL ER 500 MG PO TB24: 1000.0000 mg | ORAL_TABLET | Freq: Two times a day (BID) | ORAL | 3 refills | Status: DC

## 2023-02-08 NOTE — Patient Instructions (Signed)
-   Increase Mounjaro 5 mg weekly  - Continue Pioglitazone 15 mg daily  - Continue Repaglinide 2 mg , 1 tablet before Breakfast and Supper  - Continue  farxiga 10 mg , 1 tablet every morning  - Continue Metformin 500 mg XR 2 tablets twice a day      HOW TO TREAT LOW BLOOD SUGARS (Blood sugar LESS THAN 70 MG/DL) Please follow the RULE OF 15 for the treatment of hypoglycemia treatment (when your (blood sugars are less than 70 mg/dL)   STEP 1: Take 15 grams of carbohydrates when your blood sugar is low, which includes:  3-4 GLUCOSE TABS  OR 3-4 OZ OF JUICE OR REGULAR SODA OR ONE TUBE OF GLUCOSE GEL    STEP 2: RECHECK blood sugar in 15 MINUTES STEP 3: If your blood sugar is still low at the 15 minute recheck --> then, go back to STEP 1 and treat AGAIN with another 15 grams of carbohydrates.

## 2023-02-08 NOTE — Telephone Encounter (Signed)
Last refill: 09/06/22 #6,2 Last OV: 03/23/22 dx. CPE

## 2023-02-17 ENCOUNTER — Encounter: Payer: Self-pay | Admitting: Internal Medicine

## 2023-02-18 MED ORDER — FREESTYLE LIBRE 3 SENSOR MISC: 1.0000 | 11 refills | Status: DC

## 2023-03-15 ENCOUNTER — Ambulatory Visit: Payer: BC Managed Care – PPO | Attending: Internal Medicine | Admitting: Internal Medicine

## 2023-03-15 ENCOUNTER — Telehealth: Payer: Self-pay

## 2023-03-15 ENCOUNTER — Encounter: Payer: Self-pay | Admitting: Internal Medicine

## 2023-03-15 VITALS — BP 116/74 | HR 87 | Ht 68.0 in | Wt 160.2 lb

## 2023-03-15 DIAGNOSIS — I2510 Atherosclerotic heart disease of native coronary artery without angina pectoris: Secondary | ICD-10-CM | POA: Diagnosis not present

## 2023-03-15 MED ORDER — SEMAGLUTIDE(0.25 OR 0.5MG/DOS) 2 MG/3ML ~~LOC~~ SOPN: 0.5000 mg | PEN_INJECTOR | SUBCUTANEOUS | 11 refills | Status: DC

## 2023-03-15 NOTE — Telephone Encounter (Signed)
[facility] pharmacy called stating that Wayne Diaz is on back order for a 50 mile radius and patient doesn't want to go anywhere else. They do have Ozempic.

## 2023-03-15 NOTE — Progress Notes (Signed)
Cardiology Office Note:    Date:  03/15/2023   ID:  Wayne Diaz, DOB Dec 01, 1960, MRN [id number]  PCP:  Ardith Dark, MD   [facility] Providers Cardiologist:  Maisie Fus, MD     Referring MD: Ardith Dark, MD   No chief complaint on file.   History of Present Illness:    Wayne Diaz is a 62 y.o. male with a hx of asthma, DM2, GERD, smoker for 35 years, CAC 1079 involving LM and 3 vessel dx. He works out in the yard a lot. He denies chest pressure or SOB. He gets some dizziness. He is from [place], he may have had a stress test in the past. His mother had hx of heart disease. Mother's side had heart dx. PGF had heart disease. Father had emphysema. He's taking his stain. LDL 52 mg/dL  Interim hx 02/11/8118 Blood pressure is well controlled. He works out in the yard. He has 3 acres. He denies CP or SOB. He tried to quit smoking, did not work well.  Past Medical History:  Diagnosis Date   ALLERGIC RHINITIS 05/05/2008   ASTHMA 05/05/2008   DIABETES MELLITUS, TYPE II 04/28/2008   GERD 05/05/2008   HYPERLIPIDEMIA 05/05/2008   HYPERSOMNIA 03/10/2009   SMOKER 03/10/2009    Past Surgical History:  Procedure Laterality Date   LEFT HEART CATH AND CORONARY ANGIOGRAPHY N/A 06/15/2022   Procedure: LEFT HEART CATH AND CORONARY ANGIOGRAPHY;  Surgeon: [place], Peter M, MD;  Location: [facility] INVASIVE CV LAB;  Service: Cardiovascular;  Laterality: N/A;   s/p left knee surgury     ACL repair   TONSILLECTOMY      Current Medications: Current Outpatient Medications on File Prior to Visit  Medication Sig Dispense Refill   atorvastatin (LIPITOR) 40 MG tablet TAKE 1 TABLET BY MOUTH EVERY DAY 90 tablet 3   Continuous Blood Gluc Sensor (FREESTYLE LIBRE 3 SENSOR) MISC 1 Device by Does not apply route every 14 (fourteen) days. Place 1 sensor on the skin every 14 days. Use to check glucose continuously 2 each 11   dapagliflozin propanediol (FARXIGA) 10 MG TABS tablet Take 1 tablet (10 mg total) by  mouth daily before breakfast. 90 tablet 3   metFORMIN (GLUCOPHAGE-XR) 500 MG 24 hr tablet Take 2 tablets (1,000 mg total) by mouth 2 (two) times daily. 360 tablet 3   pioglitazone (ACTOS) 15 MG tablet Take 1 tablet (15 mg total) by mouth daily. 90 tablet 3   repaglinide (PRANDIN) 2 MG tablet Take 1 tablet (2 mg total) by mouth 2 (two) times daily before a meal. 180 tablet 3   tirzepatide (MOUNJARO) 5 MG/0.5ML Pen Inject 5 mg into the skin once a week. 6 mL 3   Ascorbic Acid (VITAMIN C) 500 MG CAPS Take 500 mg by mouth at bedtime. (Patient not taking: Reported on 03/15/2023)     aspirin EC 81 MG tablet Take 1 tablet (81 mg total) by mouth daily. Swallow whole. (Patient not taking: Reported on 03/15/2023)     Lancet Devices (ONE TOUCH DELICA LANCING DEV) MISC Used to check blood sugars. (Patient not taking: Reported on 03/15/2023) 1 each 0   Multiple Vitamins-Minerals (CENTRUM ADULTS PO) Take 1 Application by mouth at bedtime. Centrum Silver Men 50+ (Patient not taking: Reported on 03/15/2023)     Omega-3 Fatty Acids (FISH OIL PO) Take 2 capsules by mouth at bedtime. (Patient not taking: Reported on 03/15/2023)     tolnaftate (TINACTIN) 1 %  spray Apply 1 spray topically 2 (two) times daily as needed (foot irritation/itching (fungus)). (Patient not taking: Reported on 03/15/2023)     vardenafil (LEVITRA) 20 MG tablet TAKE 1 TABLET BY MOUTH EVERY DAY AS NEEDED FOR ERECTILE DYSFUNCTION (Patient not taking: Reported on 03/15/2023) 6 tablet 2   No current facility-administered medications on file prior to visit.    Allergies:   Patient has no known allergies.   Social History   Socioeconomic History   Marital status: Married    Spouse name: Not on file   Number of children: 4   Years of education: 16   Highest education level: Not on file  Occupational History   Occupation: banking business  Tobacco Use   Smoking status: Every Day    Packs/day: 1.00    Years: 35.00    Additional pack years: 0.00     Total pack years: 35.00    Types: Cigarettes   Smokeless tobacco: Never  Substance and Sexual Activity   Alcohol use: No   Drug use: No   Sexual activity: Not on file  Other Topics Concern   Not on file  Social History Narrative   Born and raised in [place], [place]. Raised [place]. Currently resides in house with his wife and daughter. Fun: Yard work, baseball.   Denies religious beliefs effecting health care.    Social Determinants of Health   Financial Resource Strain: Not on file  Food Insecurity: Not on file  Transportation Needs: Not on file  Physical Activity: Not on file  Stress: Not on file  Social Connections: Not on file     Family History: The patient's family history includes Arthritis in an other family member; Diabetes in an other family member; Heart disease in an other family member; Hyperlipidemia in an other family member; Hypertension in an other family member.  ROS:   Please see the history of present illness.     All other systems reviewed and are negative.  EKGs/Labs/Other Studies Reviewed:    The following studies were reviewed today:   EKG:  EKG is  ordered today.  The ekg ordered today demonstrates   7/28- NSR  03/15/2023- NSR  Recent Labs: 03/23/2022: ALT 17; TSH 0.63 06/12/2022: BUN 19; Creatinine, Ser 0.78; Hemoglobin 15.8; Platelets 146; Potassium 4.5; Sodium 139   Recent Lipid Panel    Component Value Date/Time   CHOL 104 03/23/2022 1506   TRIG 104 03/23/2022 1506   HDL 33 (L) 03/23/2022 1506   CHOLHDL 3.2 03/23/2022 1506   VLDL 60.2 (H) 03/08/2015 0841   LDLCALC 52 03/23/2022 1506   LDLDIRECT 79.0 03/08/2015 0841     Risk Assessment/Calculations:           Physical Exam:    VS:   Vitals:   03/15/23 0831  BP: 116/74  Pulse: 87  SpO2: 96%     Wt Readings from Last 3 Encounters:  03/15/23 160 lb 3.2 oz (72.7 kg)  02/08/23 165 lb (74.8 kg)  08/03/22 159 lb (72.1 kg)     GEN:  Well nourished, well  developed in no acute distress HEENT: Normal NECK: No JVD; No carotid bruits CARDIAC: RRR, no murmurs, rubs, gallops RESPIRATORY:  Clear to auscultation without rales, wheezing or rhonchi  ABDOMEN: Soft, non-tender, non-distended MUSCULOSKELETAL:  No edema; No deformity  SKIN: Warm and dry NEUROLOGIC:  Alert and oriented x 3 PSYCHIATRIC:  Normal affect   ASSESSMENT:   Elevated CAC: had elevated CAC 1097. Conducted an  exercise spect 2/2 this and he was found to have c/f inferior scar with peri-infarct ischemia. Estimated EF ~ 44%.LHC showed non obstructive dx with normal EF. LDL goal < 70 mg/dL. He is at goal. Continue lipitor 40 mg daily.  DM2: recommend A1c < 7. At goal. On metformin.   Smoker: consider wellbutrin for smoking cessation. We discussed the importance of quitting extensively before. He will try again  PLAN:    In order of problems listed above:  Follow up 1 year         Medication Adjustments/Labs and Tests Ordered: Current medicines are reviewed at length with the patient today.  Concerns regarding medicines are outlined above.  Orders Placed This Encounter  Procedures   EKG 12-Lead   No orders of the defined types were placed in this encounter.   Patient Instructions  Medication Instructions:  No changes *If you need a refill on your cardiac medications before your next appointment, please call your pharmacy*   Follow-Up: At [facility], you and your health needs are our priority.  As part of our continuing mission to provide you with exceptional heart care, we have created designated Provider Care Teams.  These Care Teams include your primary Cardiologist (physician) and Advanced Practice Providers (APPs -  Physician Assistants and Nurse Practitioners) who all work together to provide you with the care you need, when you need it.  We recommend signing up for the patient portal called "MyChart".  Sign up information is provided on this After  Visit Summary.  MyChart is used to connect with patients for Virtual Visits (Telemedicine).  Patients are able to view lab/test results, encounter notes, upcoming appointments, etc.  Non-urgent messages can be sent to your provider as well.   To learn more about what you can do with MyChart, go to [email or web address].    Your next appointment:   1 year(s)  Provider:   Maisie Fus, MD        Signed, Maisie Fus, MD  03/15/2023 10:47 AM    [facility]

## 2023-03-15 NOTE — Patient Instructions (Signed)
Medication Instructions:  No changes *If you need a refill on your cardiac medications before your next appointment, please call your pharmacy*  Follow-Up: At [facility], you and your health needs are our priority.  As part of our continuing mission to provide you with exceptional heart care, we have created designated Provider Care Teams.  These Care Teams include your primary Cardiologist (physician) and Advanced Practice Providers (APPs -  Physician Assistants and Nurse Practitioners) who all work together to provide you with the care you need, when you need it.  We recommend signing up for the patient portal called "MyChart".  Sign up information is provided on this After Visit Summary.  MyChart is used to connect with patients for Virtual Visits (Telemedicine).  Patients are able to view lab/test results, encounter notes, upcoming appointments, etc.  Non-urgent messages can be sent to your provider as well.   To learn more about what you can do with MyChart, go to [email or web address].    Your next appointment:   1 year(s)  Provider:   Branch, Mary E, MD      

## 2023-03-16 ENCOUNTER — Encounter: Payer: Self-pay | Admitting: Internal Medicine

## 2023-03-18 ENCOUNTER — Telehealth: Payer: Self-pay | Admitting: Family Medicine

## 2023-03-18 MED ORDER — TIRZEPATIDE 7.5 MG/0.5ML ~~LOC~~ SOAJ: 7.5000 mg | SUBCUTANEOUS | 3 refills | Status: DC

## 2023-03-18 NOTE — Telephone Encounter (Signed)
Pt is asking if Wayne Diaz would work him in for a physical on 03/25/23 at 10am. He wanted it done sooner but insurance won't cover. If not then, he is also open to 03/29/23 or 04/05/23.

## 2023-03-18 NOTE — Telephone Encounter (Signed)
His last PHY was 03/23/2022 Ok to schedule on 03/09/2023 or [id number] if any oppenings

## 2023-03-25 ENCOUNTER — Ambulatory Visit (INDEPENDENT_AMBULATORY_CARE_PROVIDER_SITE_OTHER): Payer: BC Managed Care – PPO | Admitting: Family Medicine

## 2023-03-25 ENCOUNTER — Encounter: Payer: Self-pay | Admitting: Family Medicine

## 2023-03-25 VITALS — BP 119/75 | HR 80 | Temp 98.2°F | Ht 68.0 in | Wt 158.4 lb

## 2023-03-25 DIAGNOSIS — E119 Type 2 diabetes mellitus without complications: Secondary | ICD-10-CM | POA: Diagnosis not present

## 2023-03-25 DIAGNOSIS — Z125 Encounter for screening for malignant neoplasm of prostate: Secondary | ICD-10-CM

## 2023-03-25 DIAGNOSIS — Z794 Long term (current) use of insulin: Secondary | ICD-10-CM

## 2023-03-25 DIAGNOSIS — N529 Male erectile dysfunction, unspecified: Secondary | ICD-10-CM

## 2023-03-25 DIAGNOSIS — Z23 Encounter for immunization: Secondary | ICD-10-CM | POA: Diagnosis not present

## 2023-03-25 DIAGNOSIS — F17210 Nicotine dependence, cigarettes, uncomplicated: Secondary | ICD-10-CM

## 2023-03-25 DIAGNOSIS — E785 Hyperlipidemia, unspecified: Secondary | ICD-10-CM | POA: Diagnosis not present

## 2023-03-25 DIAGNOSIS — Z0001 Encounter for general adult medical examination with abnormal findings: Secondary | ICD-10-CM

## 2023-03-25 DIAGNOSIS — I2510 Atherosclerotic heart disease of native coronary artery without angina pectoris: Secondary | ICD-10-CM

## 2023-03-25 DIAGNOSIS — I2583 Coronary atherosclerosis due to lipid rich plaque: Secondary | ICD-10-CM

## 2023-03-25 DIAGNOSIS — R300 Dysuria: Secondary | ICD-10-CM | POA: Diagnosis not present

## 2023-03-25 LAB — CBC
HCT: 48.5 % (ref 39.0–52.0)
Hemoglobin: 16.4 g/dL (ref 13.0–17.0)
MCHC: 33.8 g/dL (ref 30.0–36.0)
MCV: 93.5 fl (ref 78.0–100.0)
Platelets: 145 10*3/uL — ABNORMAL LOW (ref 150.0–400.0)
RBC: 5.19 Mil/uL (ref 4.22–5.81)
RDW: 14.3 % (ref 11.5–15.5)
WBC: 8 10*3/uL (ref 4.0–10.5)

## 2023-03-25 LAB — LIPID PANEL
Cholesterol: 113 mg/dL (ref 0–200)
HDL: 35.7 mg/dL — ABNORMAL LOW (ref >39.00)
LDL Cholesterol: 57 mg/dL (ref 0–99)
NonHDL: 76.84
Total CHOL/HDL Ratio: 3
Triglycerides: 100 mg/dL (ref 0.0–149.0)
VLDL: 20 mg/dL (ref 0.0–40.0)

## 2023-03-25 LAB — COMPREHENSIVE METABOLIC PANEL
ALT: 23 U/L (ref 0–53)
AST: 21 U/L (ref 0–37)
Albumin: 4.4 g/dL (ref 3.5–5.2)
Alkaline Phosphatase: 61 U/L (ref 39–117)
BUN: 24 mg/dL — ABNORMAL HIGH (ref 6–23)
CO2: 26 mEq/L (ref 19–32)
Calcium: 9.7 mg/dL (ref 8.4–10.5)
Chloride: 104 mEq/L (ref 96–112)
Creatinine, Ser: 0.8 mg/dL (ref 0.40–1.50)
GFR: 94.49 mL/min (ref >60.00)
Glucose, Bld: 140 mg/dL — ABNORMAL HIGH (ref 70–99)
Potassium: 4.3 mEq/L (ref 3.5–5.1)
Sodium: 141 mEq/L (ref 135–145)
Total Bilirubin: 0.4 mg/dL (ref 0.2–1.2)
Total Protein: 6.5 g/dL (ref 6.0–8.3)

## 2023-03-25 LAB — MICROALBUMIN / CREATININE URINE RATIO
Creatinine,U: 49.9 mg/dL
Microalb Creat Ratio: 2.4 mg/g (ref 0.0–30.0)
Microalb, Ur: 1.2 mg/dL (ref 0.0–1.9)

## 2023-03-25 MED ORDER — ATORVASTATIN CALCIUM 40 MG PO TABS
40.0000 mg | ORAL_TABLET | Freq: Every day | ORAL | 3 refills | Status: AC
Start: 2023-03-25 — End: ?

## 2023-03-25 MED ORDER — VARDENAFIL HCL 20 MG PO TABS: 20.0000 mg | ORAL_TABLET | Freq: Every day | ORAL | 2 refills | Status: DC | PRN

## 2023-03-25 NOTE — Assessment & Plan Note (Signed)
Follows with endocrinology.  Currently on Ozempic, Actos, repaglinide, metformin, and [place].

## 2023-03-25 NOTE — Patient Instructions (Signed)
It was very nice to see you today!  Will check blood work today and a urine sample  We will give your shingles vaccine today.  Please let us know how you do with Wellbutrin for your smoking  Return in about 1 year (around 03/24/2024) for Annual Physical.   Take care, Dr Jimmey Ralph  PLEASE NOTE:  If you had any lab tests, please let us know if you have not heard back within a few days. You may see your results on mychart before we have a chance to review them but we will give you a call once they are reviewed by [place].   If we ordered any referrals today, please let us know if you have not heard from their office within the next week.   If you had any urgent prescriptions sent in today, please check with the pharmacy within an hour of our visit to make sure the prescription was transmitted appropriately.   Please try these tips to maintain a healthy lifestyle:  Eat at least 3 REAL meals and 1-2 snacks per day.  Aim for no more than 5 hours between eating.  If you eat breakfast, please do so within one hour of getting up.   Each meal should contain half fruits/vegetables, one quarter protein, and one quarter carbs (no bigger than a computer mouse)  Cut down on sweet beverages. This includes juice, soda, and sweet tea.   Drink at least 1 glass of water with each meal and aim for at least 8 glasses per day  Exercise at least 150 minutes every week.     Preventive Care 26-1 Years Old, Male Preventive care refers to lifestyle choices and visits with your health care provider that can promote health and wellness. Preventive care visits are also called wellness exams. What can I expect for my preventive care visit? Counseling During your preventive care visit, your health care provider may ask about your: Medical history, including: Past medical problems. Family medical history. Current health, including: Emotional well-being. Home life and relationship well-being. Sexual  activity. Lifestyle, including: Alcohol, nicotine or tobacco, and drug use. Access to firearms. Diet, exercise, and sleep habits. Safety issues such as seatbelt and bike helmet use. Sunscreen use. Work and work Astronomer. Physical exam Your health care provider will check your: Height and weight. These may be used to calculate your BMI (body mass index). BMI is a measurement that tells if you are at a healthy weight. Waist circumference. This measures the distance around your waistline. This measurement also tells if you are at a healthy weight and may help predict your risk of certain diseases, such as type 2 diabetes and high blood pressure. Heart rate and blood pressure. Body temperature. Skin for abnormal spots. What immunizations do I need?  Vaccines are usually given at various ages, according to a schedule. Your health care provider will recommend vaccines for you based on your age, medical history, and lifestyle or other factors, such as travel or where you work. What tests do I need? Screening Your health care provider may recommend screening tests for certain conditions. This may include: Lipid and cholesterol levels. Diabetes screening. This is done by checking your blood sugar (glucose) after you have not eaten for a while (fasting). Hepatitis B test. Hepatitis C test. HIV (human immunodeficiency virus) test. STI (sexually transmitted infection) testing, if you are at risk. Lung cancer screening. Prostate cancer screening. Colorectal cancer screening. Talk with your health care provider about your test results, treatment  options, and if necessary, the need for more tests. Follow these instructions at home: Eating and drinking  Eat a diet that includes fresh fruits and vegetables, whole grains, lean protein, and low-fat dairy products. Take vitamin and mineral supplements as recommended by your health care provider. Do not drink alcohol if your health care provider  tells you not to drink. If you drink alcohol: Limit how much you have to 0-2 drinks a day. Know how much alcohol is in your drink. In the [place] one drink equals one 12 oz bottle of beer (355 mL), one 5 oz glass of wine (148 mL), or one 1 oz glass of hard liquor (44 mL). Lifestyle Brush your teeth every morning and night with fluoride toothpaste. Floss one time each day. Exercise for at least 30 minutes 5 or more days each week. Do not use any products that contain nicotine or tobacco. These products include cigarettes, chewing tobacco, and vaping devices, such as e-cigarettes. If you need help quitting, ask your health care provider. Do not use drugs. If you are sexually active, practice safe sex. Use a condom or other form of protection to prevent STIs. Take aspirin only as told by your health care provider. Make sure that you understand how much to take and what form to take. Work with your health care provider to find out whether it is safe and beneficial for you to take aspirin daily. Find healthy ways to manage stress, such as: Meditation, yoga, or listening to music. Journaling. Talking to a trusted person. Spending time with friends and family. Minimize exposure to UV radiation to reduce your risk of skin cancer. Safety Always wear your seat belt while driving or riding in a vehicle. Do not drive: If you have been drinking alcohol. Do not ride with someone who has been drinking. When you are tired or distracted. While texting. If you have been using any mind-altering substances or drugs. Wear a helmet and other protective equipment during sports activities. If you have firearms in your house, make sure you follow all gun safety procedures. What's next? Go to your health care provider once a year for an annual wellness visit. Ask your health care provider how often you should have your eyes and teeth checked. Stay up to date on all vaccines. This information is not intended to  replace advice given to you by your health care provider. Make sure you discuss any questions you have with your health care provider. Document Revised: 04/19/2021 Document Reviewed: 04/19/2021 Elsevier Patient Education  2023 [employer].

## 2023-03-25 NOTE — Assessment & Plan Note (Signed)
Check lipids.  He is on Lipitor 40 mg daily. 

## 2023-03-25 NOTE — Assessment & Plan Note (Signed)
Follows with cardiology.  On statin.  We discussed smoking cessation as above.

## 2023-03-25 NOTE — Assessment & Plan Note (Addendum)
Patient was asked about his tobacco use today and was strongly advised to quit. Patient is currently contemplative.  We did prescribe Wellbutrin a few months ago however he has not yet started this.  We answered his questions regarding this.  He will think about starting as soon. We reviewed treatment options to assist him quit smoking including NRT, Chantix, and Bupropion. Follow up at next office visit.   Total time spent counseling approximately 3 minutes.   Will continue with annual lung cancer screening.

## 2023-03-25 NOTE — Addendum Note (Signed)
Addended by: Dyann Kief on: 03/25/2023 12:14 PM   Modules accepted: Orders

## 2023-03-25 NOTE — Progress Notes (Signed)
Chief Complaint:  Wayne Diaz is a 62 y.o. male who presents today for his annual comprehensive physical exam.    Assessment/Plan:  Chronic Problems Addressed Today: Hyperlipidemia Check lipids. He is on Lipitor 40 mg daily.   Smokes with greater than 30 pack year history Patient was asked about his tobacco use today and was strongly advised to quit. Patient is currently contemplative.  We did prescribe Wellbutrin a few months ago however he has not yet started this.  We answered his questions regarding this.  He will think about starting as soon. We reviewed treatment options to assist him quit smoking including NRT, Chantix, and Bupropion. Follow up at next office visit.   Total time spent counseling approximately 3 minutes.   Will continue with annual lung cancer screening.    Type 2 diabetes mellitus without complication, with long-term current use of insulin (HCC) Follows with endocrinology.  Currently on Ozempic, Actos, repaglinide, metformin, and [place].  Coronary artery disease Follows with cardiology.  On statin.  We discussed smoking cessation as above.  Preventative Healthcare: Check labs.  Shingles vaccine given today.  Due for colonoscopy in 2 years.  Patient Counseling(The following topics were reviewed and/or handout was given):  -Nutrition: Stressed importance of moderation in sodium/caffeine intake, saturated fat and cholesterol, caloric balance, sufficient intake of fresh fruits, vegetables, and fiber.  -Stressed the importance of regular exercise.   -Substance Abuse: Discussed cessation/primary prevention of tobacco, alcohol, or other drug use; driving or other dangerous activities under the influence; availability of treatment for abuse.   -Injury prevention: Discussed safety belts, safety helmets, smoke detector, smoking near bedding or upholstery.   -Sexuality: Discussed sexually transmitted diseases, partner selection, use of condoms, avoidance of  unintended pregnancy and contraceptive alternatives.   -Dental health: Discussed importance of regular tooth brushing, flossing, and dental visits.  -Health maintenance and immunizations reviewed. Please refer to Health maintenance section.  Return to care in 1 year for next preventative visit.     Subjective:  HPI:  He has no acute complaints today. See Assessment / plan for status of chronic conditions.   Last see here a year ago.  At our last visit we referred him for lung cancer screening.  This was concerning for possible coronary artery disease and he was referred to cardiology.  Stress test was concerning for possible ischemic disease and then subsequently underwent catheterization which showed nonobstructive CAD.  He has been following with cardiology.  He has been compliant with the statin.  Lifestyle Diet: Balanced. Plenty of fruits and vegetables.  Exercise: Active around the house. No specific exercises.      03/25/2023   10:02 AM  Depression screen PHQ 2/9  Decreased Interest 0  Down, Depressed, Hopeless 0  PHQ - 2 Score 0    Health Maintenance Due  Topic Date Due   OPHTHALMOLOGY EXAM  Never done   Diabetic kidney evaluation - Urine ACR  04/04/2017   Zoster Vaccines- Shingrix (2 of 2) 05/18/2022     ROS: Per HPI, otherwise a complete review of systems was negative.   PMH:  The following were reviewed and entered/updated in epic: Past Medical History:  Diagnosis Date   ALLERGIC RHINITIS 05/05/2008   ASTHMA 05/05/2008   DIABETES MELLITUS, TYPE II 04/28/2008   GERD 05/05/2008   HYPERLIPIDEMIA 05/05/2008   HYPERSOMNIA 03/10/2009   SMOKER 03/10/2009   Patient Active Problem List   Diagnosis Date Noted   Coronary artery disease 03/25/2023   Abnormal  nuclear stress test    Type 2 diabetes mellitus without complication, with long-term current use of insulin (HCC) 03/23/2022   Smokes with greater than 30 pack year history 03/02/2022   Weight loss 01/20/2021   Neck pain  01/19/2019   Anxiety state 08/26/2014   Hypersomnia 09/11/2013   Olecranon bursitis of right elbow 10/14/2012   Gout 03/07/2012   BACK PAIN 06/22/2010   Nicotine dependence with current use 03/10/2009   HYPERSOMNIA 03/10/2009   Hyperlipidemia 05/05/2008   ALLERGIC RHINITIS 05/05/2008   ASTHMA 05/05/2008   GERD 05/05/2008   Past Surgical History:  Procedure Laterality Date   LEFT HEART CATH AND CORONARY ANGIOGRAPHY N/A 06/15/2022   Procedure: LEFT HEART CATH AND CORONARY ANGIOGRAPHY;  Surgeon: Swaziland, Peter M, MD;  Location: [facility] INVASIVE CV LAB;  Service: Cardiovascular;  Laterality: N/A;   s/p left knee surgury     ACL repair   TONSILLECTOMY      Family History  Problem Relation Age of Onset   Arthritis Other    Hyperlipidemia Other    Heart disease Other    Hypertension Other    Diabetes Other     Medications- reviewed and updated Current Outpatient Medications  Medication Sig Dispense Refill   dapagliflozin propanediol (FARXIGA) 10 MG TABS tablet Take 1 tablet (10 mg total) by mouth daily before breakfast. 90 tablet 3   Lancet Devices (ONE TOUCH DELICA LANCING DEV) MISC Used to check blood sugars. 1 each 0   metFORMIN (GLUCOPHAGE-XR) 500 MG 24 hr tablet Take 2 tablets (1,000 mg total) by mouth 2 (two) times daily. 360 tablet 3   pioglitazone (ACTOS) 15 MG tablet Take 1 tablet (15 mg total) by mouth daily. 90 tablet 3   repaglinide (PRANDIN) 2 MG tablet Take 1 tablet (2 mg total) by mouth 2 (two) times daily before a meal. 180 tablet 3   Semaglutide,0.25 or 0.5MG /DOS, 2 MG/3ML SOPN Inject 0.5 mg into the skin once a week. 3 mL 11   tirzepatide (MOUNJARO) 7.5 MG/0.5ML Pen Inject 7.5 mg into the skin once a week. 6 mL 3   tolnaftate (TINACTIN) 1 % spray Apply 1 spray topically 2 (two) times daily as needed (foot irritation/itching (fungus)).     atorvastatin (LIPITOR) 40 MG tablet Take 1 tablet (40 mg total) by mouth daily. 90 tablet 3   vardenafil (LEVITRA) 20 MG tablet Take  1 tablet (20 mg total) by mouth daily as needed for erectile dysfunction. 6 tablet 2   No current facility-administered medications for this visit.    Allergies-reviewed and updated No Known Allergies  Social History   Socioeconomic History   Marital status: Married    Spouse name: Not on file   Number of children: 4   Years of education: 16   Highest education level: Not on file  Occupational History   Occupation: banking business  Tobacco Use   Smoking status: Every Day    Packs/day: 1.00    Years: 35.00    Additional pack years: 0.00    Total pack years: 35.00    Types: Cigarettes   Smokeless tobacco: Never  Substance and Sexual Activity   Alcohol use: No   Drug use: No   Sexual activity: Not on file  Other Topics Concern   Not on file  Social History Narrative   Born and raised in [place], [place]. Raised [place]. Currently resides in house with his wife and daughter. Fun: Yard work, baseball.   Denies religious beliefs  effecting health care.    Social Determinants of Health   Financial Resource Strain: Not on file  Food Insecurity: Not on file  Transportation Needs: Not on file  Physical Activity: Not on file  Stress: Not on file  Social Connections: Not on file        Objective:  Physical Exam: BP 119/75   Pulse 80   Temp 98.2 F (36.8 C) (Temporal)   Ht 5\' 8"  (1.727 m)   Wt 158 lb 6.4 oz (71.8 kg)   SpO2 98%   BMI 24.08 kg/m   Body mass index is 24.08 kg/m. Wt Readings from Last 3 Encounters:  03/25/23 158 lb 6.4 oz (71.8 kg)  03/15/23 160 lb 3.2 oz (72.7 kg)  02/08/23 165 lb (74.8 kg)   Gen: NAD, resting comfortably HEENT: TMs normal bilaterally. OP clear. No thyromegaly noted.  CV: RRR with no murmurs appreciated Pulm: NWOB, CTAB with no crackles, wheezes, or rhonchi GI: Normal bowel sounds present. Soft, Nontender, Nondistended. MSK: no edema, cyanosis, or clubbing noted Skin: warm, dry Neuro: CN2-12 grossly intact. Strength  5/5 in upper and lower extremities. Reflexes symmetric and intact bilaterally.  Psych: Normal affect and thought content     Brevan Luberto M. Jimmey Ralph, MD 03/25/2023 10:33 AM

## 2023-03-26 LAB — TSH: TSH: 0.97 u[IU]/mL (ref 0.35–5.50)

## 2023-03-26 LAB — PSA: PSA: 0.35 ng/mL (ref 0.10–4.00)

## 2023-03-26 NOTE — Progress Notes (Signed)
His HDL is still borderline low but better than last year.  The rest of his labs are all stable compared to previous.  Do not need to make any changes to treatment plan at this time.  He should continue to work on diet and exercise and we can recheck in a year.

## 2023-03-28 ENCOUNTER — Other Ambulatory Visit: Payer: Self-pay | Admitting: Acute Care

## 2023-03-28 DIAGNOSIS — F17210 Nicotine dependence, cigarettes, uncomplicated: Secondary | ICD-10-CM

## 2023-03-28 DIAGNOSIS — Z87891 Personal history of nicotine dependence: Secondary | ICD-10-CM

## 2023-03-28 DIAGNOSIS — Z122 Encounter for screening for malignant neoplasm of respiratory organs: Secondary | ICD-10-CM

## 2023-05-03 ENCOUNTER — Telehealth: Payer: Self-pay | Admitting: Acute Care

## 2023-05-03 NOTE — Telephone Encounter (Signed)
Called and spoke to pt. Pt states he does not feel he should have annual LDCT scans. Patient states he was told that even though he was referred to cardiology due to coronary atherosclerosis he was then referred to have a heart cath. Patient states he was ultimately told that there is nothing wrong with his heart and should have never had the cath. Patient was frustrated and felt the LDCT scan was the cause for the "unnecessary worry" regarding his heart. Spoke with patient regarding the results of CAD. Patient states he wants to have the LDCT scans every 2- 3 years. Informed patient the importance of the annual scans for patients with a significant smoking history with increased risk for lung cancer and the importance of early detection of lung cancer. Patient verbalized understanding and confirmed he wants to only due the LDCT scans every 2-3 years and states he will call us when he wants to have a repeat scan.

## 2023-05-06 ENCOUNTER — Ambulatory Visit (HOSPITAL_COMMUNITY): Admission: RE | Admit: 2023-05-06 | Payer: BC Managed Care – PPO | Source: Ambulatory Visit

## 2023-05-08 ENCOUNTER — Other Ambulatory Visit: Payer: Self-pay

## 2023-05-08 DIAGNOSIS — Z87891 Personal history of nicotine dependence: Secondary | ICD-10-CM

## 2023-05-08 DIAGNOSIS — F17210 Nicotine dependence, cigarettes, uncomplicated: Secondary | ICD-10-CM

## 2023-05-08 DIAGNOSIS — Z122 Encounter for screening for malignant neoplasm of respiratory organs: Secondary | ICD-10-CM

## 2023-05-14 ENCOUNTER — Encounter: Payer: Self-pay | Admitting: Internal Medicine

## 2023-05-31 ENCOUNTER — Encounter: Payer: Self-pay | Admitting: Family Medicine

## 2023-08-08 ENCOUNTER — Ambulatory Visit: Payer: BC Managed Care – PPO | Admitting: Internal Medicine

## 2023-08-08 ENCOUNTER — Encounter: Payer: Self-pay | Admitting: Internal Medicine

## 2023-08-08 VITALS — BP 118/70 | HR 72 | Ht 68.0 in | Wt 145.0 lb

## 2023-08-08 DIAGNOSIS — Z7984 Long term (current) use of oral hypoglycemic drugs: Secondary | ICD-10-CM

## 2023-08-08 DIAGNOSIS — Z7985 Long-term (current) use of injectable non-insulin antidiabetic drugs: Secondary | ICD-10-CM | POA: Diagnosis not present

## 2023-08-08 DIAGNOSIS — E119 Type 2 diabetes mellitus without complications: Secondary | ICD-10-CM

## 2023-08-08 LAB — POCT GLYCOSYLATED HEMOGLOBIN (HGB A1C): Hemoglobin A1C: 6.5 % — AB (ref 4.0–5.6)

## 2023-08-08 LAB — POCT GLUCOSE (DEVICE FOR HOME USE): POC Glucose: 158 mg/dL — AB (ref 70–99)

## 2023-08-08 MED ORDER — DAPAGLIFLOZIN PROPANEDIOL 10 MG PO TABS: 10.0000 mg | ORAL_TABLET | Freq: Every day | ORAL | 3 refills | Status: DC

## 2023-08-08 MED ORDER — METFORMIN HCL ER 500 MG PO TB24: 1000.0000 mg | ORAL_TABLET | Freq: Two times a day (BID) | ORAL | 3 refills | Status: DC

## 2023-08-08 MED ORDER — TIRZEPATIDE 5 MG/0.5ML ~~LOC~~ SOAJ: 5.0000 mg | SUBCUTANEOUS | 3 refills | Status: DC

## 2023-08-08 MED ORDER — REPAGLINIDE 2 MG PO TABS
2.0000 mg | ORAL_TABLET | Freq: Two times a day (BID) | ORAL | 3 refills | Status: DC
Start: 2023-08-08 — End: 2024-08-07

## 2023-08-08 NOTE — Patient Instructions (Addendum)
-   STOP Pioglitazone (Actos) - Decrease  Mounjaro 5 mg weekly   - Continue Repaglinide 2 mg , 1 tablet before Breakfast and Supper  - Continue  farxiga 10 mg , 1 tablet every morning  - Continue Metformin 500 mg XR 2 tablets twice a day      HOW TO TREAT LOW BLOOD SUGARS (Blood sugar LESS THAN 70 MG/DL) Please follow the RULE OF 15 for the treatment of hypoglycemia treatment (when your (blood sugars are less than 70 mg/dL)   STEP 1: Take 15 grams of carbohydrates when your blood sugar is low, which includes:  3-4 GLUCOSE TABS  OR 3-4 OZ OF JUICE OR REGULAR SODA OR ONE TUBE OF GLUCOSE GEL    STEP 2: RECHECK blood sugar in 15 MINUTES STEP 3: If your blood sugar is still low at the 15 minute recheck --> then, go back to STEP 1 and treat AGAIN with another 15 grams of carbohydrates.

## 2023-08-08 NOTE — Progress Notes (Signed)
Name: Wayne Diaz  Age/ Sex: 62 y.o., male   MRN/ DOB: [id number], Sep 19, 1961     PCP: Ardith Dark, MD   Reason for Endocrinology Evaluation: Type 2 Diabetes Mellitus  Initial Endocrine Consultative Visit: 10/17/2015    PATIENT IDENTIFIER: [name] is a 62 y.o. male with a past medical history of T2DM, Dyslipidemia . The patient has followed with Endocrinology clinic since 10/17/2015 for consultative assistance with management of his diabetes.  DIABETIC HISTORY:  Wayne Diaz was diagnosed with DM 2002. His hemoglobin A1c has ranged from 6.8% in 2023, peaking at 9.4% in 2015.   He has been on Trulicity in the past, started Ozempic which was stopped in May, 2023 due to chronic diarrhea and vomiting   Transitioned from Dr. Everardo All 03/2022   Switched Trulicity to Greggory Keen 01/2023   Stopped Actos 08/2023 with an A1c of 6.5% SUBJECTIVE:   During the last visit (02/08/2023): A1c 7.8 %   Today (08/08/2023): Wayne Diaz  is here for a follow up on diabetes management. He checks his blood sugars 1-2  times daily. The patient has not had hypoglycemic episodes since the last clinic visit.  Patient has been noted with weight loss, wife believes he has lost plenty of weight  Has noted diarrhea  for 3 days  following the mounjaro injection  He denies nausea, vomiting   He works in the yard   HOME DIABETES REGIMEN:  Farxiga 10 mg daily  Metformin 500 mg XR, 2 tabs BID  tabs  Pioglitazone 15 mg daily  Repaglinide 2 mg , 1 tablet BID  Mounjaro 7.5 mg weekly      Statin: yes  ACE-I/ARB: no    METER DOWNLOAD SUMMARY: unable to download  DIABETIC COMPLICATIONS: Microvascular complications:  Denies: CKD, retinopathy, neuropathy Last Eye Exam: scheduled 04/2022  Macrovascular complications:   Denies: CAD, CVA, PVD   HISTORY:  Past Medical History:  Past Medical History:  Diagnosis Date   ALLERGIC RHINITIS 05/05/2008   ASTHMA 05/05/2008   DIABETES MELLITUS, TYPE II  04/28/2008   GERD 05/05/2008   HYPERLIPIDEMIA 05/05/2008   HYPERSOMNIA 03/10/2009   SMOKER 03/10/2009   Past Surgical History:  Past Surgical History:  Procedure Laterality Date   LEFT HEART CATH AND CORONARY ANGIOGRAPHY N/A 06/15/2022   Procedure: LEFT HEART CATH AND CORONARY ANGIOGRAPHY;  Surgeon: Swaziland, Peter M, MD;  Location: [facility] INVASIVE CV LAB;  Service: Cardiovascular;  Laterality: N/A;   s/p left knee surgury     ACL repair   TONSILLECTOMY     Social History:  reports that he has been smoking cigarettes. He has a 35 pack-year smoking history. He has never used smokeless tobacco. He reports that he does not drink alcohol and does not use drugs. Family History:  Family History  Problem Relation Age of Onset   Arthritis Other    Hyperlipidemia Other    Heart disease Other    Hypertension Other    Diabetes Other      HOME MEDICATIONS: Allergies as of 08/08/2023   No Known Allergies      Medication List        Accurate as of August 08, 2023  9:06 AM. If you have any questions, ask your nurse or doctor.          STOP taking these medications    pioglitazone 15 MG tablet Commonly known as: Actos Stopped by: Wayne Diaz   Semaglutide(0.25 or 0.5MG /DOS) 2 MG/3ML Sopn Stopped by:  Wayne Diaz   tirzepatide 7.5 MG/0.5ML Pen Commonly known as: MOUNJARO Replaced by: tirzepatide 5 MG/0.5ML Pen Stopped by: Wayne Diaz       TAKE these medications    atorvastatin 40 MG tablet Commonly known as: LIPITOR Take 1 tablet (40 mg total) by mouth daily.   dapagliflozin propanediol 10 MG Tabs tablet Commonly known as: Farxiga Take 1 tablet (10 mg total) by mouth daily before breakfast.   metFORMIN 500 MG 24 hr tablet Commonly known as: GLUCOPHAGE-XR Take 2 tablets (1,000 mg total) by mouth 2 (two) times daily.   ONE TOUCH DELICA LANCING DEV Misc Used to check blood sugars.   repaglinide 2 MG tablet Commonly known as: PRANDIN Take 1 tablet  (2 mg total) by mouth 2 (two) times daily before a meal.   tirzepatide 5 MG/0.5ML Pen Commonly known as: MOUNJARO Inject 5 mg into the skin once a week. Replaces: tirzepatide 7.5 MG/0.5ML Pen Started by: Wayne Diaz   tolnaftate 1 % spray Commonly known as: TINACTIN Apply 1 spray topically 2 (two) times daily as needed (foot irritation/itching (fungus)).   vardenafil 20 MG tablet Commonly known as: LEVITRA Take 1 tablet (20 mg total) by mouth daily as needed for erectile dysfunction.         OBJECTIVE:   Vital Signs: BP 118/70 (BP Location: Left Arm, Patient Position: Sitting, Cuff Size: Small)   Pulse 72   Ht 5\' 8"  (1.727 m)   Wt 145 lb (65.8 kg)   SpO2 97%   BMI 22.05 kg/m   Wt Readings from Last 3 Encounters:  08/08/23 145 lb (65.8 kg)  03/25/23 158 lb 6.4 oz (71.8 kg)  03/15/23 160 lb 3.2 oz (72.7 kg)     Exam: General: Pt appears well and is in NAD  Lungs: Clear with good BS bilat with no rales, rhonchi, or wheezes  Heart: RRR   Abdomen: soft, nontender, without masses  Extremities: No pretibial edema. No tremor. Normal strength and motion throughout. See detailed diabetic foot exam below.  Neuro: MS is good with appropriate affect, pt is alert and Ox3    DM foot exam:08/08/2023  The skin of the feet is intact without sores or ulcerations. The pedal pulses are 2+ on right and 2+ on left. The sensation is intact to a screening 5.07, 10 gram monofilament bilaterally          DATA REVIEWED:  Lab Results  Component Value Date   HGBA1C 6.5 (A) 08/08/2023   HGBA1C 7.8 (A) 02/08/2023   HGBA1C 7.8 (A) 08/03/2022    Latest Reference Range & Units 03/25/23 10:39  Sodium 135 - 145 mEq/L 141  Potassium 3.5 - 5.1 mEq/L 4.3  Chloride 96 - 112 mEq/L 104  CO2 19 - 32 mEq/L 26  Glucose 70 - 99 mg/dL 782 (H)  BUN 6 - 23 mg/dL 24 (H)  Creatinine 9.56 - 1.50 mg/dL 2.13  Calcium 8.4 - 08.6 mg/dL 9.7  Alkaline Phosphatase 39 - 117 U/L 61  Albumin 3.5  - 5.2 g/dL 4.4  AST 0 - 37 U/L 21  ALT 0 - 53 U/L 23  Total Protein 6.0 - 8.3 g/dL 6.5  Total Bilirubin 0.2 - 1.2 mg/dL 0.4  GFR >57.84 mL/min 94.49  Total CHOL/HDL Ratio  3  Cholesterol 0 - 200 mg/dL 696  HDL Cholesterol >29.52 mg/dL 84.13 (L)  LDL (calc) 0 - 99 mg/dL 57  MICROALB/CREAT RATIO 0.0 - 30.0 mg/g 2.4  NonHDL  76.84  Triglycerides 0.0 - 149.0 mg/dL 875.6  VLDL 0.0 - 43.3 mg/dL 29.5    In office BG 188 mg/dL   ASSESSMENT / PLAN / RECOMMENDATIONS:   1) Type 2 Diabetes Mellitus, Optimally controlled, With out complications - Most recent A1c of 6.5 %. Goal A1c < 7.0 %.    -A1c is optimal -Will discontinue pioglitazone as below - He is intolerant to  Ozempic but had tolerated Trulicity up  to 3 mg weekly in the past , currently on Mounjaro -He is tolerating Mounjaro, but has been noted with diarrhea for approximately 3 days postinjection, his wife believes he has lost too much weight and we have opted to decrease Mounjaro as below - He takes Repaglinide with breakfast and Supper only as he does not take it to work     MEDICATIONS: Stop Actos Decrease Mounjaro 5 mg weekly  Take repaglinide 2 mg , 1 tab BID Continue Farxiga 10 mg daily Continue metformin 500 mg XR 2 tabs twice daily   EDUCATION / INSTRUCTIONS: BG monitoring instructions: Patient is instructed to check his blood sugars 1 times a day, fasting. Call [facility] if: BG persistently < 70  I reviewed the Rule of 15 for the treatment of hypoglycemia in detail with the patient. Literature supplied.   2) Diabetic complications:  Eye: Does not have known diabetic retinopathy.  Neuro/ Feet: Does not have known diabetic peripheral neuropathy .  Renal: Patient does not have known baseline CKD.    F/U in 6 months   Signed electronically by: Lyndle Herrlich, MD  [facility]  [facility] [address] [place], [place] [address] Phone:  [phone] FAX: [phone]   CC: Ardith Dark, MD [address] [place] [address] Phone: [phone]  Fax: [phone]  Return to Endocrinology clinic as below: No future appointments.

## 2023-08-23 ENCOUNTER — Ambulatory Visit: Payer: BC Managed Care – PPO | Admitting: Internal Medicine

## 2023-11-14 ENCOUNTER — Emergency Department (HOSPITAL_COMMUNITY)
Admission: EM | Admit: 2023-11-14 | Discharge: 2023-11-14 | Disposition: A | Discharge status: Home / Self Care | Payer: BC Managed Care – PPO | Attending: Emergency Medicine | Admitting: Emergency Medicine

## 2023-11-14 ENCOUNTER — Encounter (HOSPITAL_COMMUNITY): Payer: Self-pay

## 2023-11-14 ENCOUNTER — Other Ambulatory Visit: Payer: Self-pay

## 2023-11-14 ENCOUNTER — Emergency Department (HOSPITAL_COMMUNITY): Payer: BC Managed Care – PPO

## 2023-11-14 DIAGNOSIS — E119 Type 2 diabetes mellitus without complications: Secondary | ICD-10-CM | POA: Insufficient documentation

## 2023-11-14 DIAGNOSIS — R1310 Dysphagia, unspecified: Secondary | ICD-10-CM | POA: Insufficient documentation

## 2023-11-14 DIAGNOSIS — R130 Aphagia: Secondary | ICD-10-CM | POA: Diagnosis not present

## 2023-11-14 DIAGNOSIS — Z0389 Encounter for observation for other suspected diseases and conditions ruled out: Secondary | ICD-10-CM | POA: Diagnosis not present

## 2023-11-14 DIAGNOSIS — Z7984 Long term (current) use of oral hypoglycemic drugs: Secondary | ICD-10-CM | POA: Insufficient documentation

## 2023-11-14 MED ORDER — GLUCAGON HCL RDNA (DIAGNOSTIC) 1 MG IJ SOLR: 1.0000 mg | Freq: Once | INTRAMUSCULAR | Status: DC

## 2023-11-14 NOTE — Discharge Instructions (Signed)
 I am glad that you are able to relieve the sensation of food being stuck after arriving here at the hospital.  Be sure to take small bites and chew them completely and drink liquids as you are eating to try and limit the symptoms.  If you continue to have difficulty swallowing or sensation of food getting stuck you should follow-up with the GI doctors for further evaluation.  Return to the ED for any new or worsening symptoms.

## 2023-11-14 NOTE — ED Notes (Signed)
 This RN reviewed discharge instructions with patient. He verbalized understanding and denied any further questions. PT well appearing upon discharge .Marland Kitchen Pt ambulated with stable gait to exit. Pt endorses ride home with  family

## 2023-11-14 NOTE — ED Provider Notes (Signed)
 [place] EMERGENCY DEPARTMENT AT [facility] Provider Note   CSN: [id number] Arrival date & time: 11/14/23  0912     History  Chief Complaint  Patient presents with   Dysphagia    Wayne Diaz is a 63 y.o. male.  Wayne Diaz is a 63 y.o. male with a history of hyperlipidemia, diabetes, GERD, who presents to the emergency department with concern for food getting stuck in the esophagus.  He reports that this morning he was eating ham and eggs for breakfast around 745 and felt like the food got stuck at the base of his throat.  At that point he was unable to swallow anything and any food or drink that he tried to swallow came right back up.  He reports that he has not even been able to swallow his saliva and has been spitting it into a bag.  He denies any emesis.  No blood in his saliva.  He denies any chest pain or shortness of breath.  He reports no prior history of food boluses has occasionally felt like food has gotten stuck or been more difficult to swallow but the symptoms have always resolved on their own.  Has previously seen [place] [facility] for routine colonoscopy but otherwise no routine GI follow-up.  The history is provided by the patient and the spouse.       Home Medications Prior to Admission medications   Medication Sig Start Date End Date Taking? Authorizing Provider  atorvastatin  (LIPITOR) 40 MG tablet Take 1 tablet (40 mg total) by mouth daily. 03/25/23   Kennyth Worth CHRISTELLA, MD  dapagliflozin  propanediol (FARXIGA ) 10 MG TABS tablet Take 1 tablet (10 mg total) by mouth daily before breakfast. 08/08/23   Shamleffer, Ibtehal Jaralla, MD  Lancet Devices (ONE TOUCH DELICA LANCING DEV) MISC Used to check blood sugars. 07/22/17   Kassie Mallick, MD  metFORMIN  (GLUCOPHAGE -XR) 500 MG 24 hr tablet Take 2 tablets (1,000 mg total) by mouth 2 (two) times daily. 08/08/23   Shamleffer, Ibtehal Jaralla, MD  repaglinide  (PRANDIN ) 2 MG tablet Take 1 tablet (2 mg total) by  mouth 2 (two) times daily before a meal. 08/08/23   Shamleffer, Donell Cardinal, MD  tirzepatide  (MOUNJARO ) 5 MG/0.5ML Pen Inject 5 mg into the skin once a week. 08/08/23   Shamleffer, Ibtehal Jaralla, MD  tolnaftate (TINACTIN) 1 % spray Apply 1 spray topically 2 (two) times daily as needed (foot irritation/itching (fungus)).    [provider]  vardenafil  (LEVITRA ) 20 MG tablet Take 1 tablet (20 mg total) by mouth daily as needed for erectile dysfunction. 03/25/23   Kennyth Worth CHRISTELLA, MD      Allergies    Patient has no known allergies.    Review of Systems   Review of Systems  Constitutional:  Negative for chills and fever.  HENT:  Positive for trouble swallowing.   Gastrointestinal:  Negative for abdominal pain, nausea and vomiting.    Physical Exam Updated Vital Signs BP (!) 134/90 (BP Location: Right Arm)   Pulse (!) 105   Temp 98.1 F (36.7 C)   Resp 18   SpO2 97%  Physical Exam Vitals and nursing note reviewed.  Constitutional:      General: He is not in acute distress.    Appearance: Normal appearance. He is well-developed. He is not ill-appearing or diaphoretic.     Comments: Well-appearing and in no acute distress, spitting saliva into emesis bag  HENT:     Head: Normocephalic  and atraumatic.     Mouth/Throat:     Mouth: Mucous membranes are moist.     Pharynx: Oropharynx is clear.     Comments: Posterior oropharynx is clear, normal phonation Eyes:     General:        Right eye: No discharge.        Left eye: No discharge.  Neck:     Comments: Neck is supple with no palpable masses, foreign bodies, no stridor on auscultation Cardiovascular:     Rate and Rhythm: Normal rate and regular rhythm.     Heart sounds: Normal heart sounds.  Pulmonary:     Effort: Pulmonary effort is normal. No respiratory distress.     Breath sounds: Normal breath sounds.     Comments: Respirations equal and unlabored, satting well on room air, lungs clear bilaterally on  auscultation.  During auscultation patient had vigorous episode of coughing Abdominal:     General: Abdomen is flat. Bowel sounds are normal.     Palpations: Abdomen is soft. There is no mass.     Tenderness: There is no abdominal tenderness.  Musculoskeletal:     Cervical back: Neck supple.  Neurological:     Mental Status: He is alert and oriented to person, place, and time.     Coordination: Coordination normal.  Psychiatric:        Mood and Affect: Mood normal.        Behavior: Behavior normal.     ED Results / Procedures / Treatments   Labs (all labs ordered are listed, but only abnormal results are displayed) Labs Reviewed - No data to display  EKG None  Radiology No results found.  Procedures Procedures    Medications Ordered in ED Medications - No data to display   ED Course/ Medical Decision Making/ A&P                                 Medical Decision Making  63 year old presented with concern for possible food bolus, no prior history of the same.  During examination patient had a vigorous episode of coughing, spit up some saliva and then felt that the sensation of food being stuck had passed.  He was able to swallow saliva and drink water without difficulty.  Given crackers for p.o. challenge as well and now tolerating both liquids and solids with no difficulty swallowing.  Chest x-ray independently viewed and interpreted, no active cardiopulmonary disease.  At this time patient is stable for discharge home, encouraged GI follow-up if patient continues to have any difficulty swallowing or sensation of food getting stuck.        Final Clinical Impression(s) / ED Diagnoses Final diagnoses:  Dysphagia, unspecified type    Rx / DC Orders ED Discharge Orders     None         Alva Larraine FALCON, PA-C 11/14/23 1004    Elnor Hila P, DO 11/28/23 [phone]

## 2023-11-14 NOTE — ED Notes (Signed)
 Dr Audrie Lia MSEing pt now.

## 2023-11-14 NOTE — ED Provider Triage Note (Signed)
 Emergency Medicine Provider Triage Evaluation Note  Wayne Diaz , a 63 y.o. male  was evaluated in triage.  Pt complains of food bolus.  Patient states that he was eating ham and eggs this morning, felt the food go to the GE junction but then vomited and feels like the food is in his mid esophagus.  Unable to tolerate secretions at this time.  Had a colonoscopy performed by [facility] 10 years ago.  Review of Systems  Positive: Vomiting Negative: Chest pain, abdominal pain, headache, fever  Physical Exam  BP (!) 134/90 (BP Location: Right Arm)   Pulse (!) 105   Temp 98.1 F (36.7 C)   Resp 18   SpO2 97%  Gen:   Awake, uncomfortable. Resp:  Normal effort  MSK:   Moves extremities without difficulty     Medical Decision Making  Medically screening exam initiated at 9:27 AM.  Appropriate orders placed.  Wayne Diaz was informed that the remainder of the evaluation will be completed by another provider, this initial triage assessment does not replace that evaluation, and the importance of remaining in the ED until their evaluation is complete.      Albertina Dixon, MD 11/14/23 0930

## 2023-11-14 NOTE — ED Triage Notes (Signed)
 Pt was eating ham and eggs this morning and got a food bolus stuck around 745 this morning. Pt unable to swallow. Pt spitting in emesis bag.

## 2023-11-22 ENCOUNTER — Telehealth: Payer: Self-pay

## 2023-11-22 ENCOUNTER — Ambulatory Visit: Payer: BC Managed Care – PPO | Admitting: Internal Medicine

## 2023-11-22 NOTE — Progress Notes (Signed)
Transition Care Management Follow-up Telephone Call Date of discharge and from where: [facility] 1/9 How have you been since you were released from the hospital? Patient does not feel the need for a follow up but is doing great Any questions or concerns? No  Items Reviewed: Did the pt receive and understand the discharge instructions provided? Yes  Medications obtained and verified? Yes  Other? No  Any new allergies since your discharge? No  Dietary orders reviewed? No Do you have support at home? Yes    Follow up appointments reviewed:  PCP Hospital f/u appt confirmed? No  Scheduled to see  on  @ . Specialist Hospital f/u appt confirmed? No  Scheduled to see  on  @ . Are transportation arrangements needed? No  If their condition worsens, is the pt aware to call PCP or go to the Emergency Dept.? Yes Was the patient provided with contact information for the PCP's office or ED? Yes Was to pt encouraged to call back with questions or concerns? Yes

## 2024-02-07 ENCOUNTER — Encounter: Payer: Self-pay | Admitting: Internal Medicine

## 2024-02-07 ENCOUNTER — Ambulatory Visit: Payer: BC Managed Care – PPO | Admitting: Internal Medicine

## 2024-02-07 VITALS — BP 124/76 | HR 95 | Wt 143.2 lb

## 2024-02-07 DIAGNOSIS — Z7984 Long term (current) use of oral hypoglycemic drugs: Secondary | ICD-10-CM

## 2024-02-07 DIAGNOSIS — E119 Type 2 diabetes mellitus without complications: Secondary | ICD-10-CM | POA: Diagnosis not present

## 2024-02-07 DIAGNOSIS — Z7985 Long-term (current) use of injectable non-insulin antidiabetic drugs: Secondary | ICD-10-CM

## 2024-02-07 LAB — POCT GLYCOSYLATED HEMOGLOBIN (HGB A1C): Hemoglobin A1C: 6.6 % — AB (ref 4.0–5.6)

## 2024-02-07 LAB — POCT GLUCOSE (DEVICE FOR HOME USE): POC Glucose: 169 mg/dL — AB (ref 70–99)

## 2024-02-07 NOTE — Patient Instructions (Signed)
-   Continue  Mounjaro 5 mg weekly   - Continue Repaglinide 2 mg , 1 tablet before Breakfast and Supper  - Continue  farxiga 10 mg , 1 tablet every morning  - Continue Metformin 500 mg XR 2 tablets twice a day      HOW TO TREAT LOW BLOOD SUGARS (Blood sugar LESS THAN 70 MG/DL) Please follow the RULE OF 15 for the treatment of hypoglycemia treatment (when your (blood sugars are less than 70 mg/dL)   STEP 1: Take 15 grams of carbohydrates when your blood sugar is low, which includes:  3-4 GLUCOSE TABS  OR 3-4 OZ OF JUICE OR REGULAR SODA OR ONE TUBE OF GLUCOSE GEL    STEP 2: RECHECK blood sugar in 15 MINUTES STEP 3: If your blood sugar is still low at the 15 minute recheck --> then, go back to STEP 1 and treat AGAIN with another 15 grams of carbohydrates.

## 2024-02-07 NOTE — Progress Notes (Signed)
 Name: Wayne Diaz  Age/ Sex: 63 y.o., male   MRN/ DOB: [id number], July 08, 1961     PCP: Ardith Dark, MD   Reason for Endocrinology Evaluation: Type 2 Diabetes Mellitus  Initial Endocrine Consultative Visit: 10/17/2015    PATIENT IDENTIFIER: Wayne Diaz is a 63 y.o. male with a past medical history of T2DM, Dyslipidemia . The patient has followed with Endocrinology clinic since 10/17/2015 for consultative assistance with management of his diabetes.  DIABETIC HISTORY:  Mr. Mccarry was diagnosed with DM 2002. His hemoglobin A1c has ranged from 6.8% in 2023, peaking at 9.4% in 2015.   He has been on Trulicity in the past, started Ozempic which was stopped in May, 2023 due to chronic diarrhea and vomiting   Transitioned from Dr. Everardo All 03/2022   Switched Trulicity to [facility] 01/2023   Stopped Actos 08/2023 with an A1c of 6.5%  We have opted to decrease Mounjaro from 7.5 mg to 5 mg 08/2023 due to extreme weight loss   SUBJECTIVE:   During the last visit (08/08/2023): A1c 6.5 %   Today (02/07/2024): [name]  is here for a follow up on diabetes management. He checks his blood sugars 1 times daily. The patient has not had hypoglycemic episodes since the last clinic visit.   He does karate on regular basis  He denies nausea, vomiting  Denies constipation, diarrhea has improved with lower dose of mounjaro     HOME DIABETES REGIMEN:  Farxiga 10 mg daily  Metformin 500 mg XR, 2 tabs BID  tabs  Repaglinide 2 mg , 1 tablet BID  Mounjaro 5 mg weekly      Statin: yes  ACE-I/ARB: no    METER DOWNLOAD SUMMARY: n/a       DIABETIC COMPLICATIONS: Microvascular complications:  Denies: CKD, retinopathy, neuropathy Last Eye Exam: scheduled 04/2022  Macrovascular complications:   Denies: CAD, CVA, PVD   HISTORY:  Past Medical History:  Past Medical History:  Diagnosis Date   ALLERGIC RHINITIS 05/05/2008   ASTHMA 05/05/2008   DIABETES MELLITUS, TYPE II  04/28/2008   GERD 05/05/2008   HYPERLIPIDEMIA 05/05/2008   HYPERSOMNIA 03/10/2009   SMOKER 03/10/2009   Past Surgical History:  Past Surgical History:  Procedure Laterality Date   LEFT HEART CATH AND CORONARY ANGIOGRAPHY N/A 06/15/2022   Procedure: LEFT HEART CATH AND CORONARY ANGIOGRAPHY;  Surgeon: Swaziland, Peter M, MD;  Location: [facility] INVASIVE CV LAB;  Service: Cardiovascular;  Laterality: N/A;   s/p left knee surgury     ACL repair   TONSILLECTOMY     Social History:  reports that he has been smoking cigarettes. He has a 35 pack-year smoking history. He has never used smokeless tobacco. He reports that he does not drink alcohol and does not use drugs. Family History:  Family History  Problem Relation Age of Onset   Arthritis Other    Hyperlipidemia Other    Heart disease Other    Hypertension Other    Diabetes Other      HOME MEDICATIONS: Allergies as of 02/07/2024   No Known Allergies      Medication List        Accurate as of February 07, 2024  9:27 AM. If you have any questions, ask your nurse or doctor.          atorvastatin 40 MG tablet Commonly known as: LIPITOR Take 1 tablet (40 mg total) by mouth daily.   dapagliflozin propanediol 10 MG Tabs tablet Commonly known  asMarcelline Deist Take 1 tablet (10 mg total) by mouth daily before breakfast.   metFORMIN 500 MG 24 hr tablet Commonly known as: GLUCOPHAGE-XR Take 2 tablets (1,000 mg total) by mouth 2 (two) times daily.   ONE TOUCH DELICA LANCING DEV Misc Used to check blood sugars.   repaglinide 2 MG tablet Commonly known as: PRANDIN Take 1 tablet (2 mg total) by mouth 2 (two) times daily before a meal.   tirzepatide 5 MG/0.5ML Pen Commonly known as: MOUNJARO Inject 5 mg into the skin once a week.   tolnaftate 1 % spray Commonly known as: TINACTIN Apply 1 spray topically 2 (two) times daily as needed (foot irritation/itching (fungus)).   vardenafil 20 MG tablet Commonly known as: LEVITRA Take 1 tablet (20 mg  total) by mouth daily as needed for erectile dysfunction.         OBJECTIVE:   Vital Signs: BP 124/76 (BP Location: Left Arm, Patient Position: Sitting, Cuff Size: Normal)   Pulse 95   Wt 143 lb 3.2 oz (65 kg) Comment: patient reported  SpO2 96%   BMI 21.77 kg/m   Wt Readings from Last 3 Encounters:  02/07/24 143 lb 3.2 oz (65 kg)  08/08/23 145 lb (65.8 kg)  03/25/23 158 lb 6.4 oz (71.8 kg)     Exam: General: Pt appears well and is in NAD  Lungs: Clear with good BS bilat  Heart: RRR   Extremities: No pretibial edema  Neuro: MS is good with appropriate affect, pt is alert and Ox3    DM foot exam:02/07/2024  The skin of the feet is intact without sores or ulcerations. The pedal pulses are 2+ on right and 2+ on left. The sensation is intact to a screening 5.07, 10 gram monofilament bilaterally    DATA REVIEWED:  Lab Results  Component Value Date   HGBA1C 6.6 (A) 02/07/2024   HGBA1C 6.5 (A) 08/08/2023   HGBA1C 7.8 (A) 02/08/2023    Latest Reference Range & Units 03/25/23 10:39  Sodium 135 - 145 mEq/L 141  Potassium 3.5 - 5.1 mEq/L 4.3  Chloride 96 - 112 mEq/L 104  CO2 19 - 32 mEq/L 26  Glucose 70 - 99 mg/dL 409 (H)  BUN 6 - 23 mg/dL 24 (H)  Creatinine 8.11 - 1.50 mg/dL 9.14  Calcium 8.4 - 78.2 mg/dL 9.7  Alkaline Phosphatase 39 - 117 U/L 61  Albumin 3.5 - 5.2 g/dL 4.4  AST 0 - 37 U/L 21  ALT 0 - 53 U/L 23  Total Protein 6.0 - 8.3 g/dL 6.5  Total Bilirubin 0.2 - 1.2 mg/dL 0.4  GFR >95.62 mL/min 94.49  Total CHOL/HDL Ratio  3  Cholesterol 0 - 200 mg/dL 130  HDL Cholesterol >86.57 mg/dL 84.69 (L)  LDL (calc) 0 - 99 mg/dL 57  MICROALB/CREAT RATIO 0.0 - 30.0 mg/g 2.4  NonHDL  76.84  Triglycerides 0.0 - 149.0 mg/dL 629.5  VLDL 0.0 - 28.4 mg/dL 13.2    In office BG 440 mg/dL   ASSESSMENT / PLAN / RECOMMENDATIONS:   1) Type 2 Diabetes Mellitus, Optimally controlled, With out complications - Most recent A1c of 6.6 %. Goal A1c < 7.0 %.    -A1c is  optimal - He is intolerant to  Ozempic but had tolerated Trulicity up  to 3 mg weekly in the past , currently on Mounjaro -Intolerant to higher dose of Mounjaro due to diarrhea      MEDICATIONS: Continue Mounjaro 5 mg weekly  Take repaglinide 2  mg , 1 tab BID Continue Farxiga 10 mg daily Continue metformin 500 mg XR 2 tabs twice daily   EDUCATION / INSTRUCTIONS: BG monitoring instructions: Patient is instructed to check his blood sugars 1 times a day, fasting. Call [facility] if: BG persistently < 70  I reviewed the Rule of 15 for the treatment of hypoglycemia in detail with the patient. Literature supplied.   2) Diabetic complications:  Eye: Does not have known diabetic retinopathy.  Neuro/ Feet: Does not have known diabetic peripheral neuropathy .  Renal: Patient does not have known baseline CKD.    F/U in 6 months   Signed electronically by: Lyndle Herrlich, MD  [facility]  [facility] [address] [place], [place] [address] Phone: [phone] FAX: [phone]   CC: Ardith Dark, MD [address] [place] [address] Phone: [phone]  Fax: [phone]  Return to Endocrinology clinic as below: No future appointments.

## 2024-02-21 ENCOUNTER — Other Ambulatory Visit: Payer: Self-pay | Admitting: Family Medicine

## 2024-02-21 DIAGNOSIS — N529 Male erectile dysfunction, unspecified: Secondary | ICD-10-CM

## 2024-03-23 ENCOUNTER — Other Ambulatory Visit: Payer: Self-pay | Admitting: Family Medicine

## 2024-03-23 DIAGNOSIS — N529 Male erectile dysfunction, unspecified: Secondary | ICD-10-CM

## 2024-04-09 ENCOUNTER — Encounter: Payer: Self-pay | Admitting: Family Medicine

## 2024-04-19 ENCOUNTER — Other Ambulatory Visit: Payer: Self-pay | Admitting: Internal Medicine

## 2024-04-19 ENCOUNTER — Other Ambulatory Visit: Payer: Self-pay | Admitting: Family Medicine

## 2024-04-29 DIAGNOSIS — H6121 Impacted cerumen, right ear: Secondary | ICD-10-CM | POA: Diagnosis not present

## 2024-04-29 DIAGNOSIS — R0981 Nasal congestion: Secondary | ICD-10-CM | POA: Diagnosis not present

## 2024-04-29 DIAGNOSIS — R070 Pain in throat: Secondary | ICD-10-CM | POA: Diagnosis not present

## 2024-05-01 ENCOUNTER — Encounter (HOSPITAL_COMMUNITY): Payer: Self-pay

## 2024-05-01 ENCOUNTER — Ambulatory Visit (HOSPITAL_COMMUNITY)
Admission: RE | Admit: 2024-05-01 | Discharge: 2024-05-01 | Disposition: A | Discharge status: Home / Self Care | Source: Ambulatory Visit | Attending: Family Medicine | Admitting: Family Medicine

## 2024-05-01 DIAGNOSIS — F17210 Nicotine dependence, cigarettes, uncomplicated: Secondary | ICD-10-CM | POA: Insufficient documentation

## 2024-05-01 DIAGNOSIS — Z122 Encounter for screening for malignant neoplasm of respiratory organs: Secondary | ICD-10-CM | POA: Diagnosis not present

## 2024-05-01 DIAGNOSIS — Z87891 Personal history of nicotine dependence: Secondary | ICD-10-CM | POA: Diagnosis not present

## 2024-05-11 ENCOUNTER — Other Ambulatory Visit: Payer: Self-pay

## 2024-05-11 DIAGNOSIS — F17210 Nicotine dependence, cigarettes, uncomplicated: Secondary | ICD-10-CM

## 2024-05-11 DIAGNOSIS — Z87891 Personal history of nicotine dependence: Secondary | ICD-10-CM

## 2024-05-11 DIAGNOSIS — Z122 Encounter for screening for malignant neoplasm of respiratory organs: Secondary | ICD-10-CM

## 2024-05-22 ENCOUNTER — Other Ambulatory Visit: Payer: Self-pay | Admitting: Family Medicine

## 2024-05-29 ENCOUNTER — Ambulatory Visit: Admitting: Family Medicine

## 2024-05-29 ENCOUNTER — Encounter: Payer: Self-pay | Admitting: Family Medicine

## 2024-05-29 VITALS — BP 119/71 | HR 84 | Temp 98.1°F | Ht 68.0 in | Wt 145.0 lb

## 2024-05-29 DIAGNOSIS — Z0001 Encounter for general adult medical examination with abnormal findings: Secondary | ICD-10-CM | POA: Diagnosis not present

## 2024-05-29 DIAGNOSIS — F17200 Nicotine dependence, unspecified, uncomplicated: Secondary | ICD-10-CM

## 2024-05-29 DIAGNOSIS — Z125 Encounter for screening for malignant neoplasm of prostate: Secondary | ICD-10-CM | POA: Diagnosis not present

## 2024-05-29 DIAGNOSIS — J45909 Unspecified asthma, uncomplicated: Secondary | ICD-10-CM

## 2024-05-29 DIAGNOSIS — E119 Type 2 diabetes mellitus without complications: Secondary | ICD-10-CM | POA: Diagnosis not present

## 2024-05-29 DIAGNOSIS — E1169 Type 2 diabetes mellitus with other specified complication: Secondary | ICD-10-CM

## 2024-05-29 DIAGNOSIS — E785 Hyperlipidemia, unspecified: Secondary | ICD-10-CM

## 2024-05-29 DIAGNOSIS — Z794 Long term (current) use of insulin: Secondary | ICD-10-CM

## 2024-05-29 DIAGNOSIS — Z23 Encounter for immunization: Secondary | ICD-10-CM

## 2024-05-29 DIAGNOSIS — N529 Male erectile dysfunction, unspecified: Secondary | ICD-10-CM

## 2024-05-29 LAB — LIPID PANEL
Cholesterol: 126 mg/dL (ref 0–200)
HDL: 44.3 mg/dL (ref >39.00)
LDL Cholesterol: 60 mg/dL (ref 0–99)
NonHDL: 82.12
Total CHOL/HDL Ratio: 3
Triglycerides: 109 mg/dL (ref 0.0–149.0)
VLDL: 21.8 mg/dL (ref 0.0–40.0)

## 2024-05-29 LAB — CBC
HCT: 42.9 % (ref 39.0–52.0)
Hemoglobin: 14.3 g/dL (ref 13.0–17.0)
MCHC: 33.4 g/dL (ref 30.0–36.0)
MCV: 92 fl (ref 78.0–100.0)
Platelets: 132 K/uL — ABNORMAL LOW (ref 150.0–400.0)
RBC: 4.67 Mil/uL (ref 4.22–5.81)
RDW: 13.5 % (ref 11.5–15.5)
WBC: 7.4 K/uL (ref 4.0–10.5)

## 2024-05-29 LAB — COMPREHENSIVE METABOLIC PANEL WITH GFR
ALT: 21 U/L (ref 0–53)
AST: 16 U/L (ref 0–37)
Albumin: 4.3 g/dL (ref 3.5–5.2)
Alkaline Phosphatase: 54 U/L (ref 39–117)
BUN: 23 mg/dL (ref 6–23)
CO2: 28 meq/L (ref 19–32)
Calcium: 9.3 mg/dL (ref 8.4–10.5)
Chloride: 102 meq/L (ref 96–112)
Creatinine, Ser: 0.75 mg/dL (ref 0.40–1.50)
GFR: 95.55 mL/min (ref >60.00)
Glucose, Bld: 93 mg/dL (ref 70–99)
Potassium: 4.2 meq/L (ref 3.5–5.1)
Sodium: 138 meq/L (ref 135–145)
Total Bilirubin: 0.4 mg/dL (ref 0.2–1.2)
Total Protein: 6.2 g/dL (ref 6.0–8.3)

## 2024-05-29 LAB — MICROALBUMIN / CREATININE URINE RATIO
Creatinine,U: 91.3 mg/dL
Microalb Creat Ratio: 15.1 mg/g (ref 0.0–30.0)
Microalb, Ur: 1.4 mg/dL (ref 0.0–1.9)

## 2024-05-29 LAB — TSH: TSH: 1.82 u[IU]/mL (ref 0.35–5.50)

## 2024-05-29 LAB — PSA: PSA: 0.34 ng/mL (ref 0.10–4.00)

## 2024-05-29 MED ORDER — VARDENAFIL HCL 20 MG PO TABS: 20.0000 mg | ORAL_TABLET | Freq: Every day | ORAL | 0 refills | Status: DC | PRN

## 2024-05-29 MED ORDER — ALBUTEROL SULFATE HFA 108 (90 BASE) MCG/ACT IN AERS: 2.0000 | INHALATION_SPRAY | Freq: Four times a day (QID) | RESPIRATORY_TRACT | 0 refills | Status: DC | PRN

## 2024-05-29 MED ORDER — VARENICLINE TARTRATE 1 MG PO TABS: 1.0000 mg | ORAL_TABLET | Freq: Two times a day (BID) | ORAL | 3 refills | Status: DC

## 2024-05-29 MED ORDER — VARENICLINE TARTRATE (STARTER) 0.5 MG X 11 & 1 MG X 42 PO TBPK
ORAL_TABLET | ORAL | 0 refills | Status: DC
Start: 2024-05-29 — End: 2024-07-14

## 2024-05-29 NOTE — Assessment & Plan Note (Signed)
Check lipids.  He is on Lipitor 40 mg daily. 

## 2024-05-29 NOTE — Patient Instructions (Addendum)
 It was very nice to see you today!  We will check blood work today.   Please start the Chantix .  Let me know in a few weeks how this is working for you  I will send prescription in for albuterol.  I will refill your other medications.  Will get your Tdap today.  I will see back in year for your next physical.  Come back sooner if needed.  Return in about 1 year (around 05/29/2025) for Annual Physical.   Take care, Dr Kennyth  PLEASE NOTE:  If you had any lab tests, please let us  know if you have not heard back within a few days. You may see your results on mychart before we have a chance to review them but we will give you a call once they are reviewed by us .   If we ordered any referrals today, please let us  know if you have not heard from their office within the next week.   If you had any urgent prescriptions sent in today, please check with the pharmacy within an hour of our visit to make sure the prescription was transmitted appropriately.   Please try these tips to maintain a healthy lifestyle:  Eat at least 3 REAL meals and 1-2 snacks per day.  Aim for no more than 5 hours between eating.  If you eat breakfast, please do so within one hour of getting up.   Each meal should contain half fruits/vegetables, one quarter protein, and one quarter carbs (no bigger than a computer mouse)  Cut down on sweet beverages. This includes juice, soda, and sweet tea.   Drink at least 1 glass of water with each meal and aim for at least 8 glasses per day  Exercise at least 150 minutes every week.    Preventive Care 64-25 Years Old, Male Preventive care refers to lifestyle choices and visits with your health care provider that can promote health and wellness. Preventive care visits are also called wellness exams. What can I expect for my preventive care visit? Counseling During your preventive care visit, your health care provider may ask about your: Medical history, including: Past  medical problems. Family medical history. Current health, including: Emotional well-being. Home life and relationship well-being. Sexual activity. Lifestyle, including: Alcohol, nicotine or tobacco, and drug use. Access to firearms. Diet, exercise, and sleep habits. Safety issues such as seatbelt and bike helmet use. Sunscreen use. Work and work Astronomer. Physical exam Your health care provider will check your: Height and weight. These may be used to calculate your BMI (body mass index). BMI is a measurement that tells if you are at a healthy weight. Waist circumference. This measures the distance around your waistline. This measurement also tells if you are at a healthy weight and may help predict your risk of certain diseases, such as type 2 diabetes and high blood pressure. Heart rate and blood pressure. Body temperature. Skin for abnormal spots. What immunizations do I need?  Vaccines are usually given at various ages, according to a schedule. Your health care provider will recommend vaccines for you based on your age, medical history, and lifestyle or other factors, such as travel or where you work. What tests do I need? Screening Your health care provider may recommend screening tests for certain conditions. This may include: Lipid and cholesterol levels. Diabetes screening. This is done by checking your blood sugar (glucose) after you have not eaten for a while (fasting). Hepatitis B test. Hepatitis C test. HIV (human  immunodeficiency virus) test. STI (sexually transmitted infection) testing, if you are at risk. Lung cancer screening. Prostate cancer screening. Colorectal cancer screening. Talk with your health care provider about your test results, treatment options, and if necessary, the need for more tests. Follow these instructions at home: Eating and drinking  Eat a diet that includes fresh fruits and vegetables, whole grains, lean protein, and low-fat dairy  products. Take vitamin and mineral supplements as recommended by your health care provider. Do not drink alcohol if your health care provider tells you not to drink. If you drink alcohol: Limit how much you have to 0-2 drinks a day. Know how much alcohol is in your drink. In the [place] one drink equals one 12 oz bottle of beer (355 mL), one 5 oz glass of wine (148 mL), or one 1 oz glass of hard liquor (44 mL). Lifestyle Brush your teeth every morning and night with fluoride toothpaste. Floss one time each day. Exercise for at least 30 minutes 5 or more days each week. Do not use any products that contain nicotine or tobacco. These products include cigarettes, chewing tobacco, and vaping devices, such as e-cigarettes. If you need help quitting, ask your health care provider. Do not use drugs. If you are sexually active, practice safe sex. Use a condom or other form of protection to prevent STIs. Take aspirin  only as told by your health care provider. Make sure that you understand how much to take and what form to take. Work with your health care provider to find out whether it is safe and beneficial for you to take aspirin  daily. Find healthy ways to manage stress, such as: Meditation, yoga, or listening to music. Journaling. Talking to a trusted person. Spending time with friends and family. Minimize exposure to UV radiation to reduce your risk of skin cancer. Safety Always wear your seat belt while driving or riding in a vehicle. Do not drive: If you have been drinking alcohol. Do not ride with someone who has been drinking. When you are tired or distracted. While texting. If you have been using any mind-altering substances or drugs. Wear a helmet and other protective equipment during sports activities. If you have firearms in your house, make sure you follow all gun safety procedures. What's next? Go to your health care provider once a year for an annual wellness visit. Ask your  health care provider how often you should have your eyes and teeth checked. Stay up to date on all vaccines. This information is not intended to replace advice given to you by your health care provider. Make sure you discuss any questions you have with your health care provider. Document Revised: 04/19/2021 Document Reviewed: 04/19/2021 Elsevier Patient Education  2024 [employer].

## 2024-05-29 NOTE — Assessment & Plan Note (Signed)
 Follows with endocrinology.  A1c's have been well-controlled.  We did discuss checking today however he would like to wait until he sees endocrinology to have this checked.

## 2024-05-29 NOTE — Assessment & Plan Note (Signed)
 Overall symptoms are been stable although occasionally having some cough and wheezing.  Likely does have some element of COPD as well.  He has been on albuterol in the past and has done well with this and would like to have this restarted today.  Will send prescription in.  We did discuss further evaluation with PFTs or pulmonology referral and also trial of daily inhalers however he would like to hold off on this for now.  He will let us  know if symptoms worsen or cha between now and next visit.nge

## 2024-05-29 NOTE — Progress Notes (Signed)
 Chief Complaint:  Wayne Diaz is a 63 y.o. male who presents today for his annual comprehensive physical exam.    Assessment/Plan:  Chronic Problems Addressed Today: Dyslipidemia due to type 2 diabetes mellitus (HCC) Check lipids.  He is on Lipitor 40 mg daily.  Type 2 diabetes mellitus without complication, with long-term current use of insulin  (HCC) Follows with endocrinology.  A1c's have been well-controlled.  We did discuss checking today however he would like to wait until he sees endocrinology to have this checked.  Asthma Overall symptoms are been stable although occasionally having some cough and wheezing.  Likely does have some element of COPD as well.  He has been on albuterol in the past and has done well with this and would like to have this restarted today.  Will send prescription in.  We did discuss further evaluation with PFTs or pulmonology referral and also trial of daily inhalers however he would like to hold off on this for now.  He will let us  know if symptoms worsen or cha between now and next visit.nge   Nicotine dependence with current use Patient was asked about his tobacco use today and was strongly advised to quit. Patient is currently contemplative. We reviewed treatment options to assist him quit smoking including NRT, Chantix , and Bupropion .  We will start Chantix .  Discussed side effects.  Follow up at next office visit.   Total time spent counseling approximately 3 minutes.    Preventative Healthcare: Tetanus, Diphtheria, and Pertussis (Tdap) given today -he is not due until next year but prefers to have it done today due to newborn grandchild soon. Check labs.   Patient Counseling(The following topics were reviewed and/or handout was given):  -Nutrition: Stressed importance of moderation in sodium/caffeine intake, saturated fat and cholesterol, caloric balance, sufficient intake of fresh fruits, vegetables, and fiber.  -Stressed the importance of regular  exercise.   -Substance Abuse: Discussed cessation/primary prevention of tobacco, alcohol, or other drug use; driving or other dangerous activities under the influence; availability of treatment for abuse.   -Injury prevention: Discussed safety belts, safety helmets, smoke detector, smoking near bedding or upholstery.   -Sexuality: Discussed sexually transmitted diseases, partner selection, use of condoms, avoidance of unintended pregnancy and contraceptive alternatives.   -Dental health: Discussed importance of regular tooth brushing, flossing, and dental visits.  -Health maintenance and immunizations reviewed. Please refer to Health maintenance section.  Return to care in 1 year for next preventative visit.     Subjective:  HPI:  He has no acute complaints today. See assessment / plan for status of chronic conditions. HE is here today for his annual physical.       05/29/2024    9:54 AM  Depression screen PHQ 2/9  Decreased Interest 0  Down, Depressed, Hopeless 0  PHQ - 2 Score 0    Health Maintenance Due  Topic Date Due   OPHTHALMOLOGY EXAM  Never done   Diabetic kidney evaluation - Urine ACR  03/07/2011   Pneumococcal Vaccine 37-83 Years old (2 of 2 - PCV) 03/11/2011   Diabetic kidney evaluation - eGFR measurement  03/24/2024     ROS: Per HPI, otherwise a complete review of systems was negative.   PMH:  The following were reviewed and entered/updated in epic: Past Medical History:  Diagnosis Date   ALLERGIC RHINITIS 05/05/2008   ASTHMA 05/05/2008   DIABETES MELLITUS, TYPE II 04/28/2008   GERD 05/05/2008   HYPERLIPIDEMIA 05/05/2008   HYPERSOMNIA 03/10/2009  SMOKER 03/10/2009   Patient Active Problem List   Diagnosis Date Noted   Type 2 diabetes mellitus without complication, without long-term current use of insulin  (HCC) 08/08/2023   Coronary artery disease 03/25/2023   Abnormal nuclear stress test    Type 2 diabetes mellitus without complication, with long-term current use  of insulin  (HCC) 03/23/2022   Smokes with greater than 30 pack year history 03/02/2022   Weight loss 01/20/2021   Neck pain 01/19/2019   Anxiety state 08/26/2014   Hypersomnia 09/11/2013   Olecranon bursitis of right elbow 10/14/2012   Gout 03/07/2012   Backache 06/22/2010   Nicotine dependence with current use 03/10/2009   HYPERSOMNIA 03/10/2009   Dyslipidemia due to type 2 diabetes mellitus (HCC) 05/05/2008   Allergic rhinitis 05/05/2008   Asthma 05/05/2008   GERD 05/05/2008   Past Surgical History:  Procedure Laterality Date   LEFT HEART CATH AND CORONARY ANGIOGRAPHY N/A 06/15/2022   Procedure: LEFT HEART CATH AND CORONARY ANGIOGRAPHY;  Surgeon: Swaziland, Peter M, MD;  Location: [facility] CV LAB;  Service: Cardiovascular;  Laterality: N/A;   s/p left knee surgury     ACL repair   TONSILLECTOMY      Family History  Problem Relation Age of Onset   Arthritis Other    Hyperlipidemia Other    Heart disease Other    Hypertension Other    Diabetes Other     Medications- reviewed and updated Current Outpatient Medications  Medication Sig Dispense Refill   albuterol (VENTOLIN HFA) 108 (90 Base) MCG/ACT inhaler Inhale 2 puffs into the lungs every 6 (six) hours as needed for wheezing or shortness of breath. 8 g 0   atorvastatin  (LIPITOR) 40 MG tablet TAKE 1 TABLET BY MOUTH EVERY DAY 90 tablet 1   Lancet Devices (ONE TOUCH DELICA LANCING DEV) MISC Used to check blood sugars. 1 each 0   metFORMIN  (GLUCOPHAGE -XR) 500 MG 24 hr tablet Take 2 tablets (1,000 mg total) by mouth 2 (two) times daily. 360 tablet 3   repaglinide  (PRANDIN ) 2 MG tablet Take 1 tablet (2 mg total) by mouth 2 (two) times daily before a meal. 180 tablet 3   tirzepatide  (MOUNJARO ) 5 MG/0.5ML Pen Inject 5 mg into the skin once a week. 6 mL 3   varenicline  (CHANTIX ) 1 MG tablet Take 1 tablet (1 mg total) by mouth 2 (two) times daily. 60 tablet 3   Varenicline  Tartrate, Starter, (CHANTIX  STARTING MONTH PAK) 0.5 MG X 11  & 1 MG X 42 TBPK Days 1 to 3: 0.5 mg once daily, Days 4 to 7: 0.5 mg twice daily, Maintenance (day 8 and later): 1 mg twice daily 53 each 0   dapagliflozin  propanediol (FARXIGA ) 10 MG TABS tablet Take 1 tablet (10 mg total) by mouth daily before breakfast. (Patient not taking: Reported on 05/29/2024) 90 tablet 3   tolnaftate (TINACTIN) 1 % spray Apply 1 spray topically 2 (two) times daily as needed (foot irritation/itching (fungus)).     vardenafil  (LEVITRA ) 20 MG tablet Take 1 tablet (20 mg total) by mouth daily as needed for erectile dysfunction. 6 tablet 0   No current facility-administered medications for this visit.    Allergies-reviewed and updated No Known Allergies  Social History   Socioeconomic History   Marital status: Married    Spouse name: Not on file   Number of children: 4   Years of education: 16   Highest education level: Not on file  Occupational History   Occupation: Photographer  business  Tobacco Use   Smoking status: Every Day    Current packs/day: 1.00    Average packs/day: 1 pack/day for 35.0 years (35.0 ttl pk-yrs)    Types: Cigarettes   Smokeless tobacco: Never  Substance and Sexual Activity   Alcohol use: No   Drug use: No   Sexual activity: Not on file  Other Topics Concern   Not on file  Social History Narrative   Born and raised in [place], [place]. Raised [place] . Currently resides in house with his wife and daughter. Fun: Yard work, baseball.   Denies religious beliefs effecting health care.    Social Drivers of Corporate investment banker Strain: Not on file  Food Insecurity: Not on file  Transportation Needs: Not on file  Physical Activity: Not on file  Stress: Not on file  Social Connections: Unknown (03/20/2022)   Received [facility]   Social Network    Social Network: Not on file        Objective:  Physical Exam: BP 119/71   Pulse 84   Temp 98.1 F (36.7 C) (Temporal)   Ht 5' 8 (1.727 m)   Wt 145 lb (65.8 kg)    SpO2 98%   BMI 22.05 kg/m   Body mass index is 22.05 kg/m. Wt Readings from Last 3 Encounters:  05/29/24 145 lb (65.8 kg)  02/07/24 143 lb 3.2 oz (65 kg)  08/08/23 145 lb (65.8 kg)   Gen: NAD, resting comfortably HEENT: TMs normal bilaterally. OP clear. No thyromegaly noted.  CV: RRR with no murmurs appreciated Pulm: NWOB, CTAB with no crackles, wheezes, or rhonchi GI: Normal bowel sounds present. Soft, Nontender, Nondistended. MSK: no edema, cyanosis, or clubbing noted Skin: warm, dry Neuro: CN2-12 grossly intact. Strength 5/5 in upper and lower extremities. Reflexes symmetric and intact bilaterally.  Psych: Normal affect and thought content     Nataleigh Griffin M. Kennyth, MD 05/29/2024 10:35 AM

## 2024-05-29 NOTE — Assessment & Plan Note (Signed)
 Patient was asked about his tobacco use today and was strongly advised to quit. Patient is currently contemplative. We reviewed treatment options to assist him quit smoking including NRT, Chantix , and Bupropion .  We will start Chantix .  Discussed side effects.  Follow up at next office visit.   Total time spent counseling approximately 3 minutes.

## 2024-06-01 ENCOUNTER — Ambulatory Visit: Payer: Self-pay | Admitting: Family Medicine

## 2024-06-01 ENCOUNTER — Encounter: Payer: Self-pay | Admitting: Family Medicine

## 2024-06-01 NOTE — Progress Notes (Signed)
 Labs are all stable.  Do not need to make any changes to treatment plan at this time.  He should keep up the great work and we can recheck again in a year.

## 2024-06-01 NOTE — Telephone Encounter (Signed)
 That is fine for him to discontinue and recheck in 3 to 6 months.

## 2024-06-01 NOTE — Telephone Encounter (Signed)
 Dr. Kennyth, see message from patient regarding lipid panel.

## 2024-06-03 NOTE — Progress Notes (Signed)
 Please see MyChart message.

## 2024-06-04 ENCOUNTER — Other Ambulatory Visit: Payer: Self-pay | Admitting: *Deleted

## 2024-06-04 DIAGNOSIS — E1169 Type 2 diabetes mellitus with other specified complication: Secondary | ICD-10-CM

## 2024-06-09 ENCOUNTER — Other Ambulatory Visit: Payer: Self-pay | Admitting: Family Medicine

## 2024-06-09 DIAGNOSIS — N529 Male erectile dysfunction, unspecified: Secondary | ICD-10-CM

## 2024-06-19 ENCOUNTER — Encounter: Payer: Self-pay | Admitting: Internal Medicine

## 2024-06-20 ENCOUNTER — Other Ambulatory Visit: Payer: Self-pay | Admitting: Family Medicine

## 2024-07-13 ENCOUNTER — Other Ambulatory Visit: Payer: Self-pay | Admitting: Family Medicine

## 2024-08-07 ENCOUNTER — Other Ambulatory Visit: Payer: Self-pay

## 2024-08-07 ENCOUNTER — Encounter: Payer: Self-pay | Admitting: Internal Medicine

## 2024-08-07 ENCOUNTER — Telehealth: Payer: Self-pay | Admitting: Internal Medicine

## 2024-08-07 ENCOUNTER — Ambulatory Visit: Admitting: Internal Medicine

## 2024-08-07 VITALS — BP 120/78 | HR 89 | Ht 68.0 in | Wt 149.0 lb

## 2024-08-07 DIAGNOSIS — Z7984 Long term (current) use of oral hypoglycemic drugs: Secondary | ICD-10-CM

## 2024-08-07 DIAGNOSIS — E119 Type 2 diabetes mellitus without complications: Secondary | ICD-10-CM

## 2024-08-07 DIAGNOSIS — E785 Hyperlipidemia, unspecified: Secondary | ICD-10-CM

## 2024-08-07 DIAGNOSIS — Z7985 Long-term (current) use of injectable non-insulin antidiabetic drugs: Secondary | ICD-10-CM | POA: Diagnosis not present

## 2024-08-07 LAB — POCT GLYCOSYLATED HEMOGLOBIN (HGB A1C): Hemoglobin A1C: 6.2 % — AB (ref 4.0–5.6)

## 2024-08-07 MED ORDER — DEXCOM G7 SENSOR MISC: 3 refills | Status: AC

## 2024-08-07 MED ORDER — METFORMIN HCL ER 500 MG PO TB24: 1000.0000 mg | ORAL_TABLET | Freq: Two times a day (BID) | ORAL | 3 refills | Status: AC

## 2024-08-07 MED ORDER — TIRZEPATIDE 2.5 MG/0.5ML ~~LOC~~ SOAJ: 2.5000 mg | SUBCUTANEOUS | 3 refills | Status: AC

## 2024-08-07 MED ORDER — REPAGLINIDE 2 MG PO TABS: 2.0000 mg | ORAL_TABLET | Freq: Two times a day (BID) | ORAL | 3 refills | Status: AC

## 2024-08-07 NOTE — Progress Notes (Signed)
 Name: Wayne Diaz  Age/ Sex: 63 y.o., male   MRN/ DOB: [id number], Aug 12, 1961     PCP: Wayne Worth CHRISTELLA, MD   Reason for Endocrinology Evaluation: Type 2 Diabetes Mellitus  Initial Endocrine Consultative Visit: 10/17/2015    PATIENT IDENTIFIER: Wayne Diaz is a 63 y.o. male with a past medical history of T2DM, Dyslipidemia . The patient has followed with Endocrinology clinic since 10/17/2015 for consultative assistance with management of his diabetes.  DIABETIC HISTORY:  Wayne Diaz was diagnosed with DM 2002. His hemoglobin A1c has ranged from 6.8% in 2023, peaking at 9.4% in 2015.   He has been on Trulicity  in the past, started Ozempic  which was stopped in May, 2023 due to chronic diarrhea and vomiting   Transitioned from Dr. Kassie 03/2022   Switched Trulicity  to Mounjaro  01/2023   Stopped Actos  08/2023 with an A1c of 6.5%  We have opted to decrease Mounjaro  from 7.5 mg to 5 mg 08/2023 due to extreme weight loss  Stopped Farxiga   by 06/2024 due to high cost    SUBJECTIVE:   During the last visit (02/07/2024): A1c 6.6 %   Today (08/07/2024): Wayne Diaz  is here for a follow up on diabetes management. He checks his blood sugars 1 times daily. The patient has not had hypoglycemic episodes since the last clinic visit.  Denies nausea or vomiting  No constipation and continues with diarrhea depending on if he eats poorly  He would like to decrease dose of mounjaro   Stopped karate  He stopped statin therapy in 05/2024 and would a recheck    HOME DIABETES REGIMEN:  Metformin  500 mg XR, 2 tabs BID  tabs  Repaglinide  2 mg , 1 tablet BID  Mounjaro  5 mg weekly      Statin: yes  ACE-I/ARB: no    METER DOWNLOAD SUMMARY:   90 day average 149 mg/dL      DIABETIC COMPLICATIONS: Microvascular complications:  Denies: CKD, retinopathy, neuropathy Last Eye Exam: scheduled 04/2022  Macrovascular complications:   Denies: CAD, CVA, PVD   HISTORY:  Past Medical  History:  Past Medical History:  Diagnosis Date   ALLERGIC RHINITIS 05/05/2008   ASTHMA 05/05/2008   DIABETES MELLITUS, TYPE II 04/28/2008   GERD 05/05/2008   HYPERLIPIDEMIA 05/05/2008   HYPERSOMNIA 03/10/2009   SMOKER 03/10/2009   Past Surgical History:  Past Surgical History:  Procedure Laterality Date   LEFT HEART CATH AND CORONARY ANGIOGRAPHY N/A 06/15/2022   Procedure: LEFT HEART CATH AND CORONARY ANGIOGRAPHY;  Surgeon: Swaziland, Peter M, MD;  Location: [facility] INVASIVE CV LAB;  Service: Cardiovascular;  Laterality: N/A;   s/p left knee surgury     ACL repair   TONSILLECTOMY     Social History:  reports that he has been smoking cigarettes. He has a 35 pack-year smoking history. He has never used smokeless tobacco. He reports that he does not drink alcohol and does not use drugs. Family History:  Family History  Problem Relation Age of Onset   Arthritis Other    Hyperlipidemia Other    Heart disease Other    Hypertension Other    Diabetes Other      HOME MEDICATIONS: Allergies as of 08/07/2024   No Known Allergies      Medication List        Accurate as of August 07, 2024  9:14 AM. If you have any questions, ask your nurse or doctor.  atorvastatin  40 MG tablet Commonly known as: LIPITOR TAKE 1 TABLET BY MOUTH EVERY DAY   dapagliflozin  propanediol 10 MG Tabs tablet Commonly known as: Farxiga  Take 1 tablet (10 mg total) by mouth daily before breakfast.   metFORMIN  500 MG 24 hr tablet Commonly known as: GLUCOPHAGE -XR Take 2 tablets (1,000 mg total) by mouth 2 (two) times daily.   ONE TOUCH DELICA LANCING DEV Misc Used to check blood sugars.   repaglinide  2 MG tablet Commonly known as: PRANDIN  Take 1 tablet (2 mg total) by mouth 2 (two) times daily before a meal.   tirzepatide  5 MG/0.5ML Pen Commonly known as: MOUNJARO  Inject 5 mg into the skin once a week.   tolnaftate 1 % spray Commonly known as: TINACTIN Apply 1 spray topically 2 (two) times daily as  needed (foot irritation/itching (fungus)).   vardenafil  20 MG tablet Commonly known as: LEVITRA  Take 1 tablet (20 mg total) by mouth daily as needed for erectile dysfunction.   varenicline  1 MG tablet Commonly known as: CHANTIX  Take 1 tablet (1 mg total) by mouth 2 (two) times daily.   Varenicline  Tartrate (Starter) 0.5 MG X 11 & 1 MG X 42 Tbpk DAYS 1 TO 3: 0.5 MG ONCE DAILY, DAYS 4 TO 7: 0.5 MG TWICE DAILY, MAINTENANCE (DAY 8 AND LATER): 1 MG TWICE DAILY   Ventolin  HFA 108 (90 Base) MCG/ACT inhaler Generic drug: albuterol  TAKE 2 PUFFS BY MOUTH EVERY 6 HOURS AS NEEDED FOR WHEEZE OR SHORTNESS OF BREATH         OBJECTIVE:   Vital Signs: BP 120/78 (BP Location: Left Arm, Patient Position: Sitting, Cuff Size: Normal)   Pulse 89   Ht 5' 8 (1.727 m)   Wt 149 lb (67.6 kg)   SpO2 98%   BMI 22.66 kg/m   Wt Readings from Last 3 Encounters:  08/07/24 149 lb (67.6 kg)  05/29/24 145 lb (65.8 kg)  02/07/24 143 lb 3.2 oz (65 kg)     Exam: General: Pt appears well and is in NAD  Lungs: Clear with good BS bilat  Heart: RRR   Extremities: No pretibial edema  Neuro: MS is good with appropriate affect, pt is alert and Ox3    DM foot exam:08/07/2024  The skin of the feet is intact without sores or ulcerations. The pedal pulses are 2+ on right and 2+ on left. The sensation is intact to a screening 5.07, 10 gram monofilament bilaterally    DATA REVIEWED:  Lab Results  Component Value Date   HGBA1C 6.6 (A) 02/07/2024   HGBA1C 6.5 (A) 08/08/2023   HGBA1C 7.8 (A) 02/08/2023    Latest Reference Range & Units 05/29/24 10:44  Sodium 135 - 145 mEq/L 138  Potassium 3.5 - 5.1 mEq/L 4.2  Chloride 96 - 112 mEq/L 102  CO2 19 - 32 mEq/L 28  Glucose 70 - 99 mg/dL 93  BUN 6 - 23 mg/dL 23  Creatinine 9.59 - 8.49 mg/dL 9.24  Calcium  8.4 - 10.5 mg/dL 9.3  Alkaline Phosphatase 39 - 117 U/L 54  Albumin 3.5 - 5.2 g/dL 4.3  AST 0 - 37 U/L 16  ALT 0 - 53 U/L 21  Total Protein 6.0 - 8.3  g/dL 6.2  Total Bilirubin 0.2 - 1.2 mg/dL 0.4  GFR >39.99 mL/min 95.55  Total CHOL/HDL Ratio  3  Cholesterol 0 - 200 mg/dL 873  HDL Cholesterol >60.99 mg/dL 55.69  LDL (calc) 0 - 99 mg/dL 60  MICROALB/CREAT RATIO 0.0 - 30.0  mg/g 15.1  NonHDL  82.12  Triglycerides 0.0 - 149.0 mg/dL 890.9  VLDL 0.0 - 59.9 mg/dL 78.1    Latest Reference Range & Units 05/29/24 10:44  TSH 0.35 - 5.50 uIU/mL 1.82    Latest Reference Range & Units 05/29/24 10:44  Creatinine,U mg/dL 08.6  Microalb, Ur 0.0 - 1.9 mg/dL 1.4  MICROALB/CREAT RATIO 0.0 - 30.0 mg/g 15.1   Old records , labs and images have been reviewed.    ASSESSMENT / PLAN / RECOMMENDATIONS:   1) Type 2 Diabetes Mellitus, Optimally controlled, With out complications - Most recent A1c of 6.2 %. Goal A1c < 7.0 %.    -A1c continues to be optimal - He is intolerant to  Ozempic  but had tolerated Trulicity  up  to 3 mg weekly in the past , currently on Mounjaro , he would like to decrease the dose of Mounjaro  as he feels he has lost so much weight and would like to regain some -Intolerant to higher dose than 5 mg of  Mounjaro  due to diarrhea  -He discontinued Farxiga  due to cost    MEDICATIONS: Decrease Mounjaro  2.5 mg weekly  Take repaglinide  2 mg , 1 tab BID Continue metformin  500 mg XR 2 tabs twice daily   EDUCATION / INSTRUCTIONS: BG monitoring instructions: Patient is instructed to check his blood sugars 1 times a day, fasting. Call [facility] if: BG persistently < 70  I reviewed the Rule of 15 for the treatment of hypoglycemia in detail with the patient. Literature supplied.   2) Diabetic complications:  Eye: Does not have known diabetic retinopathy.  Neuro/ Feet: Does not have known diabetic peripheral neuropathy .  Renal: Patient does not have known baseline CKD.   3.Dyslipidemia :  - Patient had discontinued statin therapy in July, 2025 after his lipid panel came back normal - Patient would like to have his  lipid panel retested while off statin therapy, it is too early today to check for this - I did discuss the cardiovascular benefits of statin therapy and encouraged him to consider a smaller dose   F/U in 6 months   Signed electronically by: Stefano Redgie Butts, MD  [facility] Endocrinology  [facility] [address] [place] [place], [place] [address] Phone: [phone] FAX: [phone]   CC: Wayne Worth HERO, MD [address] [place] [address] Phone: [phone]  Fax: [phone]  Return to Endocrinology clinic as below: Future Appointments  Date Time Provider Department Center  06/04/2025  9:00 AM Wayne Worth HERO, MD [facility]

## 2024-08-07 NOTE — Patient Instructions (Signed)
-   Decrease  Mounjaro  2.5 mg weekly   - Continue Repaglinide  2 mg , 1 tablet before Breakfast and Supper  - Continue Metformin  500 mg XR 2 tablets twice a day     HOW TO TREAT LOW BLOOD SUGARS (Blood sugar LESS THAN 70 MG/DL) Please follow the RULE OF 15 for the treatment of hypoglycemia treatment (when your (blood sugars are less than 70 mg/dL)   STEP 1: Take 15 grams of carbohydrates when your blood sugar is low, which includes:  3-4 GLUCOSE TABS  OR 3-4 OZ OF JUICE OR REGULAR SODA OR ONE TUBE OF GLUCOSE GEL    STEP 2: RECHECK blood sugar in 15 MINUTES STEP 3: If your blood sugar is still low at the 15 minute recheck --> then, go back to STEP 1 and treat AGAIN with another 15 grams of carbohydrates.

## 2024-08-07 NOTE — Telephone Encounter (Signed)
 Patient wants provider to see if they can prescribe a continuous glucose monitor to manage their DM.Please advise

## 2024-08-07 NOTE — Telephone Encounter (Signed)
 Dexcom prescription has been sent and patient advise that insurance may not cover due to not been on insulin .

## 2024-08-11 ENCOUNTER — Other Ambulatory Visit: Payer: Self-pay | Admitting: Family Medicine

## 2024-08-11 DIAGNOSIS — N529 Male erectile dysfunction, unspecified: Secondary | ICD-10-CM

## 2024-08-22 ENCOUNTER — Other Ambulatory Visit: Payer: Self-pay | Admitting: Family Medicine

## 2024-10-26 ENCOUNTER — Other Ambulatory Visit: Payer: Self-pay | Admitting: Family Medicine

## 2024-10-26 DIAGNOSIS — N529 Male erectile dysfunction, unspecified: Secondary | ICD-10-CM

## 2025-02-12 ENCOUNTER — Ambulatory Visit: Admitting: Internal Medicine

## 2025-06-04 ENCOUNTER — Encounter: Admitting: Family Medicine
